# Patient Record
Sex: Male | Born: 1935 | Race: White | Hispanic: No | Marital: Married | Smoking: Former smoker
Health system: Southern US, Community
[De-identification: ages and names within clinical notes are randomized; demographics above are authoritative.]

## PROBLEM LIST (undated history)

## (undated) DIAGNOSIS — E039 Hypothyroidism, unspecified: Secondary | ICD-10-CM

## (undated) DIAGNOSIS — R609 Edema, unspecified: Secondary | ICD-10-CM

## (undated) DIAGNOSIS — G252 Other specified forms of tremor: Secondary | ICD-10-CM

## (undated) DIAGNOSIS — D649 Anemia, unspecified: Secondary | ICD-10-CM

## (undated) DIAGNOSIS — I4891 Unspecified atrial fibrillation: Secondary | ICD-10-CM

## (undated) DIAGNOSIS — S60229A Contusion of unspecified hand, initial encounter: Secondary | ICD-10-CM

## (undated) DIAGNOSIS — F329 Major depressive disorder, single episode, unspecified: Secondary | ICD-10-CM

## (undated) DIAGNOSIS — I2510 Atherosclerotic heart disease of native coronary artery without angina pectoris: Secondary | ICD-10-CM

## (undated) DIAGNOSIS — J301 Allergic rhinitis due to pollen: Secondary | ICD-10-CM

## (undated) DIAGNOSIS — I509 Heart failure, unspecified: Secondary | ICD-10-CM

## (undated) DIAGNOSIS — B372 Candidiasis of skin and nail: Secondary | ICD-10-CM

## (undated) DIAGNOSIS — R269 Unspecified abnormalities of gait and mobility: Secondary | ICD-10-CM

## (undated) DIAGNOSIS — Z9181 History of falling: Secondary | ICD-10-CM

## (undated) DIAGNOSIS — Z8673 Personal history of transient ischemic attack (TIA), and cerebral infarction without residual deficits: Secondary | ICD-10-CM

## (undated) DIAGNOSIS — R079 Chest pain, unspecified: Secondary | ICD-10-CM

## (undated) DIAGNOSIS — M26629 Arthralgia of temporomandibular joint, unspecified side: Secondary | ICD-10-CM

## (undated) DIAGNOSIS — R1310 Dysphagia, unspecified: Secondary | ICD-10-CM

## (undated) DIAGNOSIS — D62 Acute posthemorrhagic anemia: Secondary | ICD-10-CM

## (undated) DIAGNOSIS — J438 Other emphysema: Secondary | ICD-10-CM

## (undated) DIAGNOSIS — I10 Essential (primary) hypertension: Secondary | ICD-10-CM

## (undated) DIAGNOSIS — M2550 Pain in unspecified joint: Secondary | ICD-10-CM

## (undated) DIAGNOSIS — G250 Essential tremor: Secondary | ICD-10-CM

## (undated) DIAGNOSIS — R942 Abnormal results of pulmonary function studies: Secondary | ICD-10-CM

## (undated) DIAGNOSIS — E785 Hyperlipidemia, unspecified: Secondary | ICD-10-CM

## (undated) DIAGNOSIS — F32A Depression, unspecified: Secondary | ICD-10-CM

## (undated) DIAGNOSIS — J449 Chronic obstructive pulmonary disease, unspecified: Secondary | ICD-10-CM

## (undated) DIAGNOSIS — M791 Myalgia, unspecified site: Secondary | ICD-10-CM

## (undated) DIAGNOSIS — J479 Bronchiectasis, uncomplicated: Secondary | ICD-10-CM

## (undated) DIAGNOSIS — C801 Malignant (primary) neoplasm, unspecified: Secondary | ICD-10-CM

## (undated) DIAGNOSIS — E876 Hypokalemia: Secondary | ICD-10-CM

## (undated) DIAGNOSIS — E508 Other manifestations of vitamin A deficiency: Secondary | ICD-10-CM

## (undated) DIAGNOSIS — M4800 Spinal stenosis, site unspecified: Secondary | ICD-10-CM

## (undated) DIAGNOSIS — J189 Pneumonia, unspecified organism: Secondary | ICD-10-CM

## (undated) DIAGNOSIS — R413 Other amnesia: Secondary | ICD-10-CM

## (undated) DIAGNOSIS — E871 Hypo-osmolality and hyponatremia: Secondary | ICD-10-CM

## (undated) DIAGNOSIS — A318 Other mycobacterial infections: Secondary | ICD-10-CM

## (undated) DIAGNOSIS — M6281 Muscle weakness (generalized): Secondary | ICD-10-CM

## (undated) DIAGNOSIS — M1990 Unspecified osteoarthritis, unspecified site: Secondary | ICD-10-CM

## (undated) DIAGNOSIS — Z87898 Personal history of other specified conditions: Secondary | ICD-10-CM

## (undated) DIAGNOSIS — R21 Rash and other nonspecific skin eruption: Secondary | ICD-10-CM

## (undated) DIAGNOSIS — H6120 Impacted cerumen, unspecified ear: Secondary | ICD-10-CM

## (undated) DIAGNOSIS — I959 Hypotension, unspecified: Secondary | ICD-10-CM

## (undated) DIAGNOSIS — M4000 Postural kyphosis, site unspecified: Secondary | ICD-10-CM

## (undated) DIAGNOSIS — K210 Gastro-esophageal reflux disease with esophagitis: Secondary | ICD-10-CM

## (undated) DIAGNOSIS — I259 Chronic ischemic heart disease, unspecified: Secondary | ICD-10-CM

## (undated) HISTORY — DX: Spinal stenosis, site unspecified: M48.00

## (undated) HISTORY — DX: Major depressive disorder, single episode, unspecified: F32.9

## (undated) HISTORY — DX: Dysphagia, unspecified: R13.10

## (undated) HISTORY — DX: Personal history of other specified conditions: Z87.898

## (undated) HISTORY — DX: Depression, unspecified: F32.A

## (undated) HISTORY — DX: Other manifestations of vitamin A deficiency: E50.8

## (undated) HISTORY — DX: Allergic rhinitis due to pollen: J30.1

## (undated) HISTORY — DX: Other specified forms of tremor: G25.2

## (undated) HISTORY — PX: CATARACT EXTRACTION: SUR2

## (undated) HISTORY — DX: Hypo-osmolality and hyponatremia: E87.1

## (undated) HISTORY — DX: Contusion of unspecified hand, initial encounter: S60.229A

## (undated) HISTORY — DX: Hypothyroidism, unspecified: E03.9

## (undated) HISTORY — DX: Hyperlipidemia, unspecified: E78.5

## (undated) HISTORY — DX: Personal history of transient ischemic attack (TIA), and cerebral infarction without residual deficits: Z86.73

## (undated) HISTORY — DX: Gastro-esophageal reflux disease with esophagitis: K21.0

## (undated) HISTORY — DX: Hypokalemia: E87.6

## (undated) HISTORY — DX: Bronchiectasis, uncomplicated: J47.9

## (undated) HISTORY — DX: Other mycobacterial infections: A31.8

## (undated) HISTORY — DX: Hypotension, unspecified: I95.9

## (undated) HISTORY — DX: Myalgia, unspecified site: M79.10

## (undated) HISTORY — DX: Acute posthemorrhagic anemia: D62

## (undated) HISTORY — DX: Edema, unspecified: R60.9

## (undated) HISTORY — PX: CARPAL TUNNEL RELEASE: SHX101

## (undated) HISTORY — DX: Muscle weakness (generalized): M62.81

## (undated) HISTORY — DX: Chest pain, unspecified: R07.9

## (undated) HISTORY — DX: Chronic ischemic heart disease, unspecified: I25.9

## (undated) HISTORY — DX: Pain in unspecified joint: M25.50

## (undated) HISTORY — DX: Abnormal results of pulmonary function studies: R94.2

## (undated) HISTORY — DX: Other amnesia: R41.3

## (undated) HISTORY — DX: Atherosclerotic heart disease of native coronary artery without angina pectoris: I25.10

## (undated) HISTORY — DX: History of falling: Z91.81

## (undated) HISTORY — DX: Candidiasis of skin and nail: B37.2

## (undated) HISTORY — DX: Impacted cerumen, unspecified ear: H61.20

## (undated) HISTORY — DX: Unspecified atrial fibrillation: I48.91

## (undated) HISTORY — DX: Rash and other nonspecific skin eruption: R21

## (undated) HISTORY — DX: Malignant (primary) neoplasm, unspecified: C80.1

## (undated) HISTORY — DX: Essential (primary) hypertension: I10

## (undated) HISTORY — PX: LAMINECTOMY: SHX219

## (undated) HISTORY — DX: Unspecified abnormalities of gait and mobility: R26.9

## (undated) HISTORY — DX: Chronic obstructive pulmonary disease, unspecified: J44.9

## (undated) HISTORY — DX: Postural kyphosis, site unspecified: M40.00

## (undated) HISTORY — DX: Essential tremor: G25.0

## (undated) HISTORY — DX: Anemia, unspecified: D64.9

## (undated) HISTORY — DX: Arthralgia of temporomandibular joint, unspecified side: M26.629

## (undated) HISTORY — PX: ROTATOR CUFF REPAIR: SHX139

## (undated) HISTORY — DX: Other emphysema: J43.8

---

## 1989-11-18 HISTORY — PX: CORONARY ANGIOPLASTY: SHX604

## 1998-05-19 ENCOUNTER — Other Ambulatory Visit: Admission: RE | Admit: 1998-05-19 | Discharge: 1998-05-19 | Payer: Self-pay | Admitting: Cardiology

## 1998-09-19 ENCOUNTER — Ambulatory Visit (HOSPITAL_COMMUNITY): Admission: RE | Admit: 1998-09-19 | Discharge: 1998-09-19 | Payer: Self-pay | Admitting: Internal Medicine

## 1998-09-19 ENCOUNTER — Encounter: Payer: Self-pay | Admitting: Internal Medicine

## 2000-03-21 ENCOUNTER — Other Ambulatory Visit: Admission: RE | Admit: 2000-03-21 | Discharge: 2000-03-21 | Payer: Self-pay | Admitting: Gastroenterology

## 2000-06-13 ENCOUNTER — Ambulatory Visit (HOSPITAL_COMMUNITY): Admission: RE | Admit: 2000-06-13 | Discharge: 2000-06-14 | Payer: Self-pay | Admitting: Cardiology

## 2000-06-13 HISTORY — PX: CARDIAC CATHETERIZATION: SHX172

## 2001-06-12 ENCOUNTER — Encounter: Payer: Self-pay | Admitting: Emergency Medicine

## 2001-06-12 ENCOUNTER — Emergency Department (HOSPITAL_COMMUNITY): Admission: EM | Admit: 2001-06-12 | Discharge: 2001-06-12 | Payer: Self-pay | Admitting: Emergency Medicine

## 2001-12-02 ENCOUNTER — Encounter: Payer: Self-pay | Admitting: Orthopedic Surgery

## 2001-12-02 ENCOUNTER — Encounter: Admission: RE | Admit: 2001-12-02 | Discharge: 2001-12-02 | Payer: Self-pay | Admitting: Orthopedic Surgery

## 2002-02-04 ENCOUNTER — Inpatient Hospital Stay (HOSPITAL_COMMUNITY): Admission: RE | Admit: 2002-02-04 | Discharge: 2002-02-06 | Payer: Self-pay | Admitting: Orthopedic Surgery

## 2004-03-19 ENCOUNTER — Encounter: Admission: RE | Admit: 2004-03-19 | Discharge: 2004-03-19 | Payer: Self-pay | Admitting: Orthopedic Surgery

## 2004-04-23 ENCOUNTER — Inpatient Hospital Stay (HOSPITAL_COMMUNITY): Admission: RE | Admit: 2004-04-23 | Discharge: 2004-04-25 | Payer: Self-pay | Admitting: Orthopedic Surgery

## 2004-05-29 ENCOUNTER — Ambulatory Visit (HOSPITAL_COMMUNITY): Admission: RE | Admit: 2004-05-29 | Discharge: 2004-05-29 | Payer: Self-pay | Admitting: Orthopedic Surgery

## 2004-11-21 ENCOUNTER — Ambulatory Visit: Payer: Self-pay | Admitting: Internal Medicine

## 2005-02-14 ENCOUNTER — Ambulatory Visit: Payer: Self-pay | Admitting: Internal Medicine

## 2005-03-14 ENCOUNTER — Ambulatory Visit: Payer: Self-pay | Admitting: Internal Medicine

## 2005-04-09 ENCOUNTER — Ambulatory Visit (HOSPITAL_COMMUNITY): Admission: RE | Admit: 2005-04-09 | Discharge: 2005-04-09 | Payer: Self-pay | Admitting: Ophthalmology

## 2005-04-11 ENCOUNTER — Ambulatory Visit: Payer: Self-pay | Admitting: Internal Medicine

## 2005-06-03 ENCOUNTER — Ambulatory Visit: Payer: Self-pay | Admitting: Internal Medicine

## 2005-06-18 HISTORY — PX: KNEE SURGERY: SHX244

## 2005-07-16 ENCOUNTER — Inpatient Hospital Stay (HOSPITAL_COMMUNITY): Admission: RE | Admit: 2005-07-16 | Discharge: 2005-07-23 | Payer: Self-pay | Admitting: Orthopedic Surgery

## 2005-07-16 ENCOUNTER — Ambulatory Visit: Payer: Self-pay | Admitting: Physical Medicine & Rehabilitation

## 2005-08-20 ENCOUNTER — Encounter: Admission: RE | Admit: 2005-08-20 | Discharge: 2005-08-20 | Payer: Self-pay | Admitting: Surgery

## 2005-08-23 ENCOUNTER — Ambulatory Visit (HOSPITAL_COMMUNITY): Admission: RE | Admit: 2005-08-23 | Discharge: 2005-08-23 | Payer: Self-pay | Admitting: Surgery

## 2005-08-23 ENCOUNTER — Encounter (INDEPENDENT_AMBULATORY_CARE_PROVIDER_SITE_OTHER): Payer: Self-pay | Admitting: *Deleted

## 2005-08-23 ENCOUNTER — Ambulatory Visit (HOSPITAL_BASED_OUTPATIENT_CLINIC_OR_DEPARTMENT_OTHER): Admission: RE | Admit: 2005-08-23 | Discharge: 2005-08-23 | Payer: Self-pay | Admitting: Surgery

## 2005-08-29 ENCOUNTER — Ambulatory Visit: Payer: Self-pay | Admitting: Hematology and Oncology

## 2005-09-06 ENCOUNTER — Ambulatory Visit: Payer: Self-pay | Admitting: Internal Medicine

## 2005-09-11 ENCOUNTER — Ambulatory Visit (HOSPITAL_COMMUNITY): Admission: RE | Admit: 2005-09-11 | Discharge: 2005-09-11 | Payer: Self-pay | Admitting: Hematology and Oncology

## 2005-09-18 ENCOUNTER — Encounter (INDEPENDENT_AMBULATORY_CARE_PROVIDER_SITE_OTHER): Payer: Self-pay | Admitting: Specialist

## 2005-09-18 ENCOUNTER — Ambulatory Visit (HOSPITAL_COMMUNITY): Admission: RE | Admit: 2005-09-18 | Discharge: 2005-09-18 | Payer: Self-pay | Admitting: Hematology and Oncology

## 2005-10-09 ENCOUNTER — Ambulatory Visit: Payer: Self-pay | Admitting: Internal Medicine

## 2005-10-18 ENCOUNTER — Ambulatory Visit: Payer: Self-pay | Admitting: Pulmonary Disease

## 2005-10-24 ENCOUNTER — Ambulatory Visit: Payer: Self-pay | Admitting: Hematology and Oncology

## 2005-10-30 ENCOUNTER — Ambulatory Visit (HOSPITAL_COMMUNITY): Admission: RE | Admit: 2005-10-30 | Discharge: 2005-10-30 | Payer: Self-pay | Admitting: Hematology and Oncology

## 2005-11-21 ENCOUNTER — Ambulatory Visit: Payer: Self-pay | Admitting: Internal Medicine

## 2006-03-04 ENCOUNTER — Ambulatory Visit: Payer: Self-pay | Admitting: Internal Medicine

## 2006-04-15 ENCOUNTER — Ambulatory Visit: Payer: Self-pay | Admitting: Internal Medicine

## 2006-04-30 ENCOUNTER — Ambulatory Visit: Payer: Self-pay | Admitting: Hematology and Oncology

## 2006-05-02 LAB — CBC WITH DIFFERENTIAL/PLATELET
Basophils Absolute: 0 10*3/uL (ref 0.0–0.1)
EOS%: 2.2 % (ref 0.0–7.0)
Eosinophils Absolute: 0.1 10*3/uL (ref 0.0–0.5)
HCT: 38 % — ABNORMAL LOW (ref 38.7–49.9)
HGB: 12.8 g/dL — ABNORMAL LOW (ref 13.0–17.1)
MCH: 33.8 pg — ABNORMAL HIGH (ref 28.0–33.4)
MCV: 100.6 fL — ABNORMAL HIGH (ref 81.6–98.0)
NEUT#: 3 10*3/uL (ref 1.5–6.5)
NEUT%: 60.9 % (ref 40.0–75.0)
RDW: 14.3 % (ref 11.2–14.6)
lymph#: 1.1 10*3/uL (ref 0.9–3.3)

## 2006-05-02 LAB — LACTATE DEHYDROGENASE: LDH: 196 U/L (ref 94–250)

## 2006-05-02 LAB — COMPREHENSIVE METABOLIC PANEL
Albumin: 4.1 g/dL (ref 3.5–5.2)
BUN: 11 mg/dL (ref 6–23)
Calcium: 9.4 mg/dL (ref 8.4–10.5)
Chloride: 102 mEq/L (ref 96–112)
Creatinine, Ser: 1.04 mg/dL (ref 0.40–1.50)
Glucose, Bld: 69 mg/dL — ABNORMAL LOW (ref 70–99)
Potassium: 4.3 mEq/L (ref 3.5–5.3)

## 2006-05-06 ENCOUNTER — Ambulatory Visit (HOSPITAL_COMMUNITY): Admission: RE | Admit: 2006-05-06 | Discharge: 2006-05-06 | Payer: Self-pay | Admitting: Hematology and Oncology

## 2006-05-14 ENCOUNTER — Ambulatory Visit (HOSPITAL_COMMUNITY): Admission: RE | Admit: 2006-05-14 | Discharge: 2006-05-14 | Payer: Self-pay | Admitting: Hematology and Oncology

## 2006-07-08 ENCOUNTER — Ambulatory Visit: Payer: Self-pay | Admitting: Internal Medicine

## 2006-09-19 ENCOUNTER — Ambulatory Visit: Payer: Self-pay | Admitting: Internal Medicine

## 2006-10-31 ENCOUNTER — Ambulatory Visit: Payer: Self-pay | Admitting: Internal Medicine

## 2006-12-30 ENCOUNTER — Ambulatory Visit: Payer: Self-pay | Admitting: Hematology and Oncology

## 2006-12-30 ENCOUNTER — Emergency Department (HOSPITAL_COMMUNITY): Admission: EM | Admit: 2006-12-30 | Discharge: 2006-12-30 | Payer: Self-pay | Admitting: Emergency Medicine

## 2007-01-02 LAB — CBC WITH DIFFERENTIAL/PLATELET
BASO%: 1.6 % (ref 0.0–2.0)
MCHC: 35.7 g/dL (ref 32.0–35.9)
MONO#: 0.7 10*3/uL (ref 0.1–0.9)
RBC: 3.52 10*6/uL — ABNORMAL LOW (ref 4.20–5.71)
WBC: 6.6 10*3/uL (ref 4.0–10.0)
lymph#: 1.4 10*3/uL (ref 0.9–3.3)

## 2007-01-02 LAB — COMPREHENSIVE METABOLIC PANEL
ALT: 17 U/L (ref 0–53)
CO2: 27 mEq/L (ref 19–32)
Calcium: 8.9 mg/dL (ref 8.4–10.5)
Chloride: 104 mEq/L (ref 96–112)
Potassium: 4.5 mEq/L (ref 3.5–5.3)
Sodium: 140 mEq/L (ref 135–145)
Total Protein: 7 g/dL (ref 6.0–8.3)

## 2007-01-02 LAB — LACTATE DEHYDROGENASE: LDH: 196 U/L (ref 94–250)

## 2007-01-06 ENCOUNTER — Ambulatory Visit (HOSPITAL_COMMUNITY): Admission: RE | Admit: 2007-01-06 | Discharge: 2007-01-06 | Payer: Self-pay | Admitting: Hematology and Oncology

## 2007-03-06 ENCOUNTER — Ambulatory Visit: Payer: Self-pay | Admitting: Internal Medicine

## 2007-09-02 ENCOUNTER — Ambulatory Visit: Payer: Self-pay | Admitting: Internal Medicine

## 2007-09-28 ENCOUNTER — Ambulatory Visit: Payer: Self-pay | Admitting: Hematology and Oncology

## 2007-09-30 LAB — CBC WITH DIFFERENTIAL/PLATELET
BASO%: 0.5 % (ref 0.0–2.0)
EOS%: 1 % (ref 0.0–7.0)
HCT: 35.6 % — ABNORMAL LOW (ref 38.7–49.9)
MCH: 35.8 pg — ABNORMAL HIGH (ref 28.0–33.4)
MCHC: 35.4 g/dL (ref 32.0–35.9)
NEUT%: 66.5 % (ref 40.0–75.0)
lymph#: 1.5 10*3/uL (ref 0.9–3.3)

## 2007-09-30 LAB — COMPREHENSIVE METABOLIC PANEL
ALT: 17 U/L (ref 0–53)
AST: 22 U/L (ref 0–37)
Alkaline Phosphatase: 53 U/L (ref 39–117)
Calcium: 9 mg/dL (ref 8.4–10.5)
Chloride: 102 mEq/L (ref 96–112)
Creatinine, Ser: 0.97 mg/dL (ref 0.40–1.50)
Total Bilirubin: 0.5 mg/dL (ref 0.3–1.2)

## 2007-10-05 ENCOUNTER — Ambulatory Visit (HOSPITAL_COMMUNITY): Admission: RE | Admit: 2007-10-05 | Discharge: 2007-10-05 | Payer: Self-pay | Admitting: Hematology and Oncology

## 2007-10-13 ENCOUNTER — Inpatient Hospital Stay (HOSPITAL_COMMUNITY): Admission: RE | Admit: 2007-10-13 | Discharge: 2007-10-15 | Payer: Self-pay | Admitting: Orthopedic Surgery

## 2007-10-24 DIAGNOSIS — J449 Chronic obstructive pulmonary disease, unspecified: Secondary | ICD-10-CM

## 2007-10-24 DIAGNOSIS — A318 Other mycobacterial infections: Secondary | ICD-10-CM | POA: Insufficient documentation

## 2007-10-24 DIAGNOSIS — J4489 Other specified chronic obstructive pulmonary disease: Secondary | ICD-10-CM | POA: Insufficient documentation

## 2008-03-21 ENCOUNTER — Ambulatory Visit: Payer: Self-pay | Admitting: Internal Medicine

## 2008-03-21 DIAGNOSIS — J309 Allergic rhinitis, unspecified: Secondary | ICD-10-CM | POA: Insufficient documentation

## 2008-03-21 DIAGNOSIS — R29818 Other symptoms and signs involving the nervous system: Secondary | ICD-10-CM | POA: Insufficient documentation

## 2008-03-25 ENCOUNTER — Telehealth (INDEPENDENT_AMBULATORY_CARE_PROVIDER_SITE_OTHER): Payer: Self-pay | Admitting: *Deleted

## 2008-03-28 ENCOUNTER — Telehealth (INDEPENDENT_AMBULATORY_CARE_PROVIDER_SITE_OTHER): Payer: Self-pay | Admitting: *Deleted

## 2008-06-27 ENCOUNTER — Encounter: Payer: Self-pay | Admitting: Internal Medicine

## 2008-09-19 IMAGING — CR DG LUMBAR SPINE 2-3V
2 series · 2 of 2 positions shown · non-contrast
Comparison: none

CLINICAL DATA: 83-year-old with spinal stenosis.  
 LUMBAR SPINE - 2 VIEW:

[view not recorded (1 of 2)]
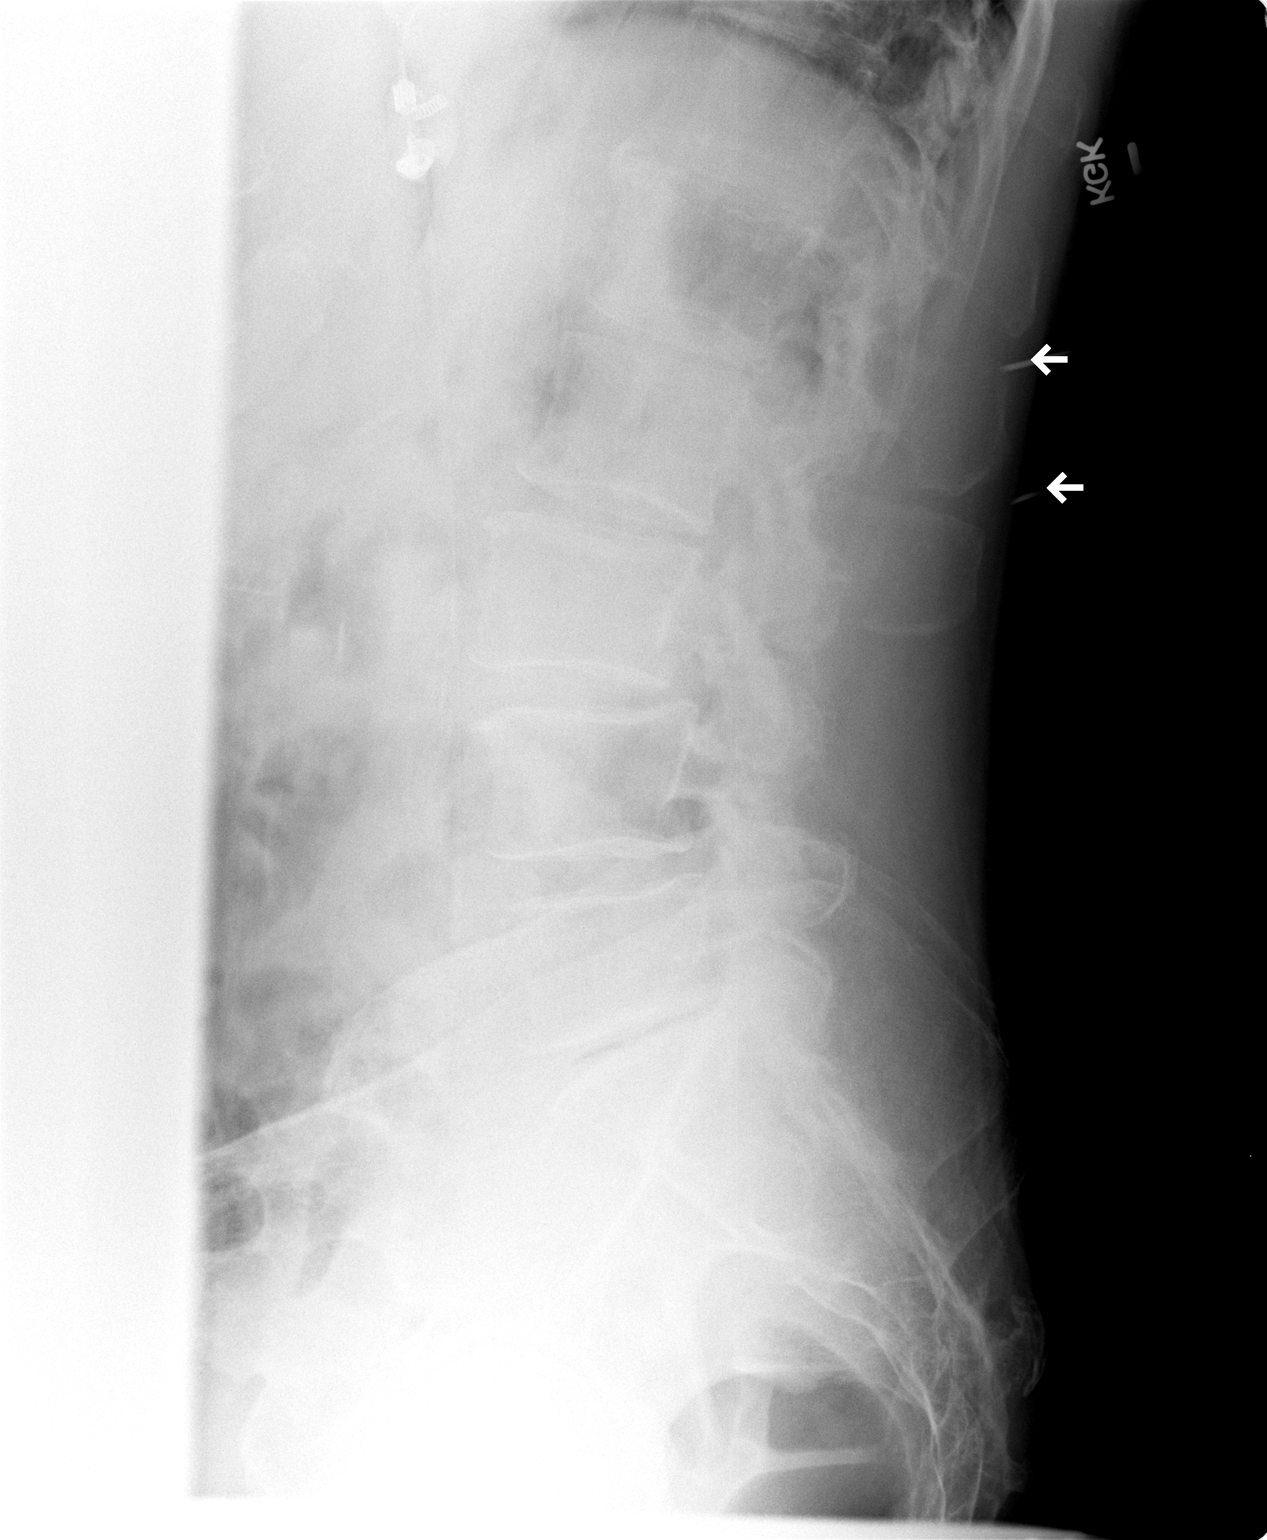

[view not recorded (2 of 2)]
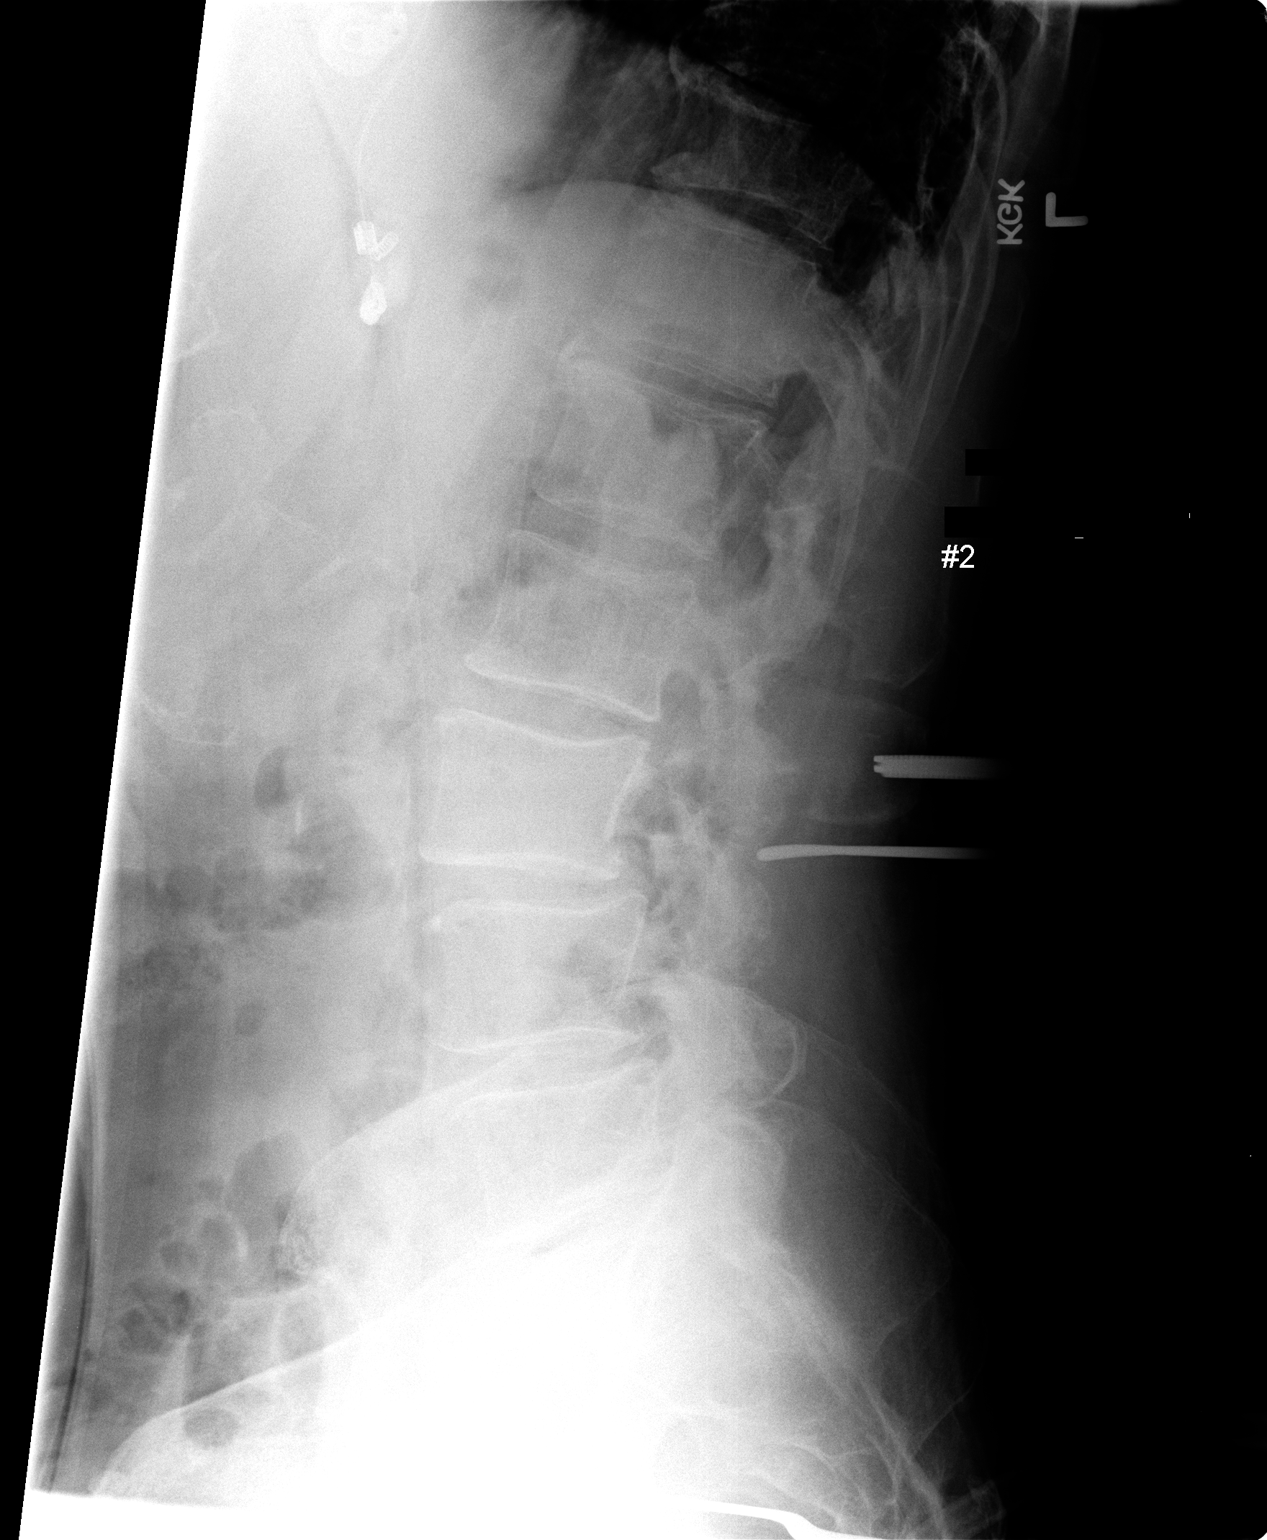

[2 of 2 positions shown; findings below may reference images not displayed]

FINDINGS: Two lateral cervical spine films from the operating room.  
 The first film demonstrates two spinal needles which are projecting at the L1 and L2 spinous processes.  The second lateral film demonstrates a surgical clamp on the L2 spinous process and a surgical instrument marking the L3 vertebral body level.
IMPRESSION: L1 and L2 marked initially and L2 and L3 marked on the last film.

## 2008-09-27 ENCOUNTER — Ambulatory Visit: Payer: Self-pay | Admitting: Hematology and Oncology

## 2008-09-29 ENCOUNTER — Ambulatory Visit (HOSPITAL_COMMUNITY): Admission: RE | Admit: 2008-09-29 | Discharge: 2008-09-29 | Payer: Self-pay | Admitting: Hematology and Oncology

## 2008-10-06 LAB — CBC WITH DIFFERENTIAL/PLATELET
BASO%: 0.4 % (ref 0.0–2.0)
LYMPH%: 26.3 % (ref 14.0–48.0)
MCHC: 34.4 g/dL (ref 32.0–35.9)
MCV: 101.5 fL — ABNORMAL HIGH (ref 81.6–98.0)
MONO#: 0.7 10*3/uL (ref 0.1–0.9)
MONO%: 11 % (ref 0.0–13.0)
NEUT#: 3.9 10*3/uL (ref 1.5–6.5)
Platelets: 180 10*3/uL (ref 145–400)
RBC: 3.37 10*6/uL — ABNORMAL LOW (ref 4.20–5.71)
RDW: 14.6 % (ref 11.2–14.6)
WBC: 6.4 10*3/uL (ref 4.0–10.0)

## 2008-10-06 LAB — COMPREHENSIVE METABOLIC PANEL
ALT: 16 U/L (ref 0–53)
AST: 23 U/L (ref 0–37)
Albumin: 4 g/dL (ref 3.5–5.2)
Alkaline Phosphatase: 51 U/L (ref 39–117)
Potassium: 4.1 mEq/L (ref 3.5–5.3)
Sodium: 137 mEq/L (ref 135–145)
Total Bilirubin: 0.5 mg/dL (ref 0.3–1.2)
Total Protein: 7.3 g/dL (ref 6.0–8.3)

## 2008-10-06 LAB — IRON AND TIBC
%SAT: 26 % (ref 20–55)
Iron: 81 ug/dL (ref 42–165)
TIBC: 313 ug/dL (ref 215–435)
UIBC: 232 ug/dL

## 2008-10-06 LAB — VITAMIN B12: Vitamin B-12: 527 pg/mL (ref 211–911)

## 2008-10-06 LAB — FOLATE: Folate: >20 ng/mL

## 2008-10-06 LAB — FERRITIN: Ferritin: 40 ng/mL (ref 22–322)

## 2008-11-22 ENCOUNTER — Ambulatory Visit: Payer: Self-pay | Admitting: Internal Medicine

## 2008-11-22 DIAGNOSIS — R05 Cough: Secondary | ICD-10-CM

## 2009-01-04 ENCOUNTER — Ambulatory Visit: Payer: Self-pay | Admitting: Vascular Surgery

## 2009-08-07 ENCOUNTER — Telehealth: Payer: Self-pay | Admitting: Internal Medicine

## 2009-09-08 ENCOUNTER — Ambulatory Visit: Payer: Self-pay | Admitting: Internal Medicine

## 2009-09-08 DIAGNOSIS — J471 Bronchiectasis with (acute) exacerbation: Secondary | ICD-10-CM

## 2009-09-28 ENCOUNTER — Ambulatory Visit: Payer: Self-pay | Admitting: Oncology

## 2009-10-02 ENCOUNTER — Ambulatory Visit (HOSPITAL_COMMUNITY): Admission: RE | Admit: 2009-10-02 | Discharge: 2009-10-02 | Payer: Self-pay | Admitting: Hematology and Oncology

## 2009-10-02 LAB — CBC WITH DIFFERENTIAL/PLATELET
EOS%: 1.4 % (ref 0.0–7.0)
Eosinophils Absolute: 0.1 10*3/uL (ref 0.0–0.5)
LYMPH%: 23.7 % (ref 14.0–49.0)
MCH: 34.8 pg — ABNORMAL HIGH (ref 27.2–33.4)
MCV: 102.4 fL — ABNORMAL HIGH (ref 79.3–98.0)
MONO%: 11.3 % (ref 0.0–14.0)
NEUT#: 3.9 10*3/uL (ref 1.5–6.5)
Platelets: 168 10*3/uL (ref 140–400)
RBC: 3.47 10*6/uL — ABNORMAL LOW (ref 4.20–5.82)
RDW: 14.6 % (ref 11.0–14.6)

## 2009-10-02 LAB — COMPREHENSIVE METABOLIC PANEL
AST: 23 U/L (ref 0–37)
Alkaline Phosphatase: 57 U/L (ref 39–117)
BUN: 12 mg/dL (ref 6–23)
Glucose, Bld: 92 mg/dL (ref 70–99)
Potassium: 4.2 mEq/L (ref 3.5–5.3)
Sodium: 137 mEq/L (ref 135–145)
Total Bilirubin: 0.6 mg/dL (ref 0.3–1.2)
Total Protein: 7.3 g/dL (ref 6.0–8.3)

## 2009-10-05 ENCOUNTER — Encounter: Payer: Self-pay | Admitting: Internal Medicine

## 2009-10-31 ENCOUNTER — Ambulatory Visit: Payer: Self-pay | Admitting: Vascular Surgery

## 2010-01-17 ENCOUNTER — Ambulatory Visit: Payer: Self-pay | Admitting: Internal Medicine

## 2010-01-18 ENCOUNTER — Telehealth (INDEPENDENT_AMBULATORY_CARE_PROVIDER_SITE_OTHER): Payer: Self-pay | Admitting: *Deleted

## 2010-03-21 ENCOUNTER — Ambulatory Visit: Payer: Self-pay | Admitting: Internal Medicine

## 2010-03-21 DIAGNOSIS — K219 Gastro-esophageal reflux disease without esophagitis: Secondary | ICD-10-CM

## 2010-03-21 DIAGNOSIS — J479 Bronchiectasis, uncomplicated: Secondary | ICD-10-CM

## 2010-04-18 HISTORY — PX: CARDIOVASCULAR STRESS TEST: SHX262

## 2010-05-02 ENCOUNTER — Inpatient Hospital Stay (HOSPITAL_COMMUNITY): Admission: EM | Admit: 2010-05-02 | Discharge: 2010-05-03 | Payer: Self-pay | Admitting: Cardiology

## 2010-05-02 ENCOUNTER — Encounter: Payer: Self-pay | Admitting: Emergency Medicine

## 2010-08-21 ENCOUNTER — Ambulatory Visit: Payer: Self-pay | Admitting: Cardiology

## 2010-09-25 ENCOUNTER — Ambulatory Visit: Payer: Self-pay | Admitting: Internal Medicine

## 2010-09-27 ENCOUNTER — Ambulatory Visit: Payer: Self-pay | Admitting: Hematology and Oncology

## 2010-10-01 LAB — COMPREHENSIVE METABOLIC PANEL
AST: 36 U/L (ref 0–37)
Albumin: 4 g/dL (ref 3.5–5.2)
BUN: 14 mg/dL (ref 6–23)
CO2: 26 mEq/L (ref 19–32)
Calcium: 8.7 mg/dL (ref 8.4–10.5)
Chloride: 103 mEq/L (ref 96–112)
Glucose, Bld: 91 mg/dL (ref 70–99)
Potassium: 3.9 mEq/L (ref 3.5–5.3)
Sodium: 137 mEq/L (ref 135–145)
Total Bilirubin: 0.4 mg/dL (ref 0.3–1.2)

## 2010-10-01 LAB — CBC WITH DIFFERENTIAL/PLATELET
BASO%: 0.3 % (ref 0.0–2.0)
Basophils Absolute: 0 10*3/uL (ref 0.0–0.1)
EOS%: 1.1 % (ref 0.0–7.0)
Eosinophils Absolute: 0.1 10*3/uL (ref 0.0–0.5)
HGB: 11 g/dL — ABNORMAL LOW (ref 13.0–17.1)
MONO%: 11.5 % (ref 0.0–14.0)
NEUT%: 67.1 % (ref 39.0–75.0)
RBC: 3.18 10*6/uL — ABNORMAL LOW (ref 4.20–5.82)
RDW: 15 % — ABNORMAL HIGH (ref 11.0–14.6)
WBC: 6.2 10*3/uL (ref 4.0–10.3)

## 2010-10-01 LAB — LACTATE DEHYDROGENASE: LDH: 240 U/L (ref 94–250)

## 2010-10-04 ENCOUNTER — Encounter: Payer: Self-pay | Admitting: Internal Medicine

## 2010-12-08 ENCOUNTER — Encounter: Payer: Self-pay | Admitting: Oncology

## 2010-12-20 NOTE — Assessment & Plan Note (Signed)
Summary: Pulmonary/ acute ext ov with hfa teaching   Primary Provider/Referring Provider:  [name]  CC:  Acute visit.  Pt c/o dry cough x 2 wks.  He states that his chest feels sore from coughing.  Marland Kitchen  History of Present Illness: 77 yowm quit smoking in 1953 with minimal airflow obstruction by PFT's 12/07 and evidence of MAI infection by previous FOB (see PMHX)  November 22, 2008 ov  acutely ill x 1 week with severe cough, mucoid sputum, and generalized ant cp from coughing assoc with mild sob while coughing only.  Pt denies any significant sore throat, nasal congestion or excess secretions, fever, chills, sweats, unintended wt loss, pleuritic or exertional cp, orthopnea pnd or leg swelling.    September 08, 2009 Acute visit.  Pt c/o prod cough with yellow sputum x 1 wk.  Pt states that his breathing has been okay but he does state that lying flat on his back can make him SOB.  better on mucinex. Wife reports more hoarseness x 1 year ? advair related.  symbicort 160 two times a day   January 17, 2010 Acute visit.  Pt c/o dry cough x 2 wks.  He states that his chest feels sore from coughing.  Mucindex dm not helping. mild dysphagia.  no sob > baseline.  Pt denies any significant sore throat, dysphagia, itching, sneezing,  nasal congestion or excess secretions,  fever, chills, sweats, unintended wt loss, lateralizing pleuritic or exertional cp, hempoptysis, change in activity tolerance  orthopnea pnd or leg swelling.  Pt also denies any obvious fluctuation in symptoms with weather or environmental change or other alleviating or aggravating factors.       Current Medications (verified): 1)  Synthroid 50 Mcg  Tabs (Levothyroxine Sodium) .... Take 1 Tablet By Mouth Once A Day 2)  Crestor 5 Mg  Tabs (Rosuvastatin Calcium) .... Take One Tablet At Bedtime 3)  Cartia Xt 300 Mg  Cp24 (Diltiazem Hcl Coated Beads) .... Take 1 Tablet By Mouth Once A Day 4)  Flonase 50 Mcg/act  Susp (Fluticasone Propionate) ....  Use One Spray in Each Nostril Every Day 5)  Preservision Areds   Caps (Multiple Vitamins-Minerals) .... Use One Capsule Two Times A Day 6)  Lutein 20 Mg  Caps (Lutein) .... Take One Capsule Once Daily 7)  Sertraline Hcl 50 Mg  Tabs (Sertraline Hcl) .... Take 1 Tablet By Mouth Once A Day 8)  Hyoscyamine Sulfate 0.125 Mg  Tabs (Hyoscyamine Sulfate) .... Take One Tablet Daily As Needed 9)  Tylenol Cold Relief 12.5-500 Mg  Tabs (Diphenhydramine-Apap) .... As Needed 10)  Nitroglycerin Cr 2.5 Mg  Cpcr (Nitroglycerin) .... As Needed 11)  Mucinex 600 Mg  Tb12 (Guaifenesin) .... Take 2 Tablets Two Times A Day As Needed 12)  Vitamin D3 1000 Unit Caps (Cholecalciferol) .Marland Kitchen.. 1 Once Daily 13)  Symbicort 160-4.5 Mcg/act  Aero (Budesonide-Formoterol Fumarate) .... 2 Puffs First Thing  in Am and 2 Puffs Again in Pm About 12 Hours Later 14)  Levaquin 750 Mg  Tabs (Levofloxacin) .... One Tablet By Mouth Daily 15)  Warfarin Sodium 2.5 Mg Tabs (Warfarin Sodium) .Marland Kitchen.. 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)     - FOB 3/98     - Rx 3/06 > 11/06 COPD (ICD-496)  - PFT's with minimal changes 12/07  - HFA 50% January 17, 2010  Diverticulosis.....................................................................................[facility]...........Marland KitchenJacky [name]    - Pneumovax 2/04 second shot  Vital Signs:  Patient profile:   77 year old male Weight:      189 pounds O2 Sat:      96 % on Room air Temp:     97.4 degrees F oral Pulse rate:   63 / minute BP sitting:   112 / 64  (left arm)  Vitals Entered By: Vernie Murders (January 17, 2010 11:01 AM)  O2 Flow:  Room air  Physical Exam  Additional Exam:  wt 182 > 186 November 22, 2008 > 188 September 09, 2009  > 189 January 17, 2010  amb chronically ill wm  nad HEENT mild turbinate edema.  Oropharynx no thrush or excess pnd or cobblestoning.  No JVD or cervical adenopathy. Mild accessory muscle hypertrophy. Trachea midline, nl thryroid. Chest was hyperinflated by percussion with diminished breath sounds and moderate increased exp time without wheeze. Hoover sign positive at mid inspiration. Regular rate and rhythm without murmur gallop or rub or increase P2 no edema.  Abd: no hsm, nl excursion. Ext warm without calf tenderness or clubbing.     CXR  Procedure date:  01/17/2010  Findings:        Comparison: 10/02/2009   Findings: Ill-defined parenchymal densities right mid and upper lung zones appear stable.  This may be due to scarring. Chronic parenchymal density in the anterior segment right upper lobe appears stable. Mild chronic parenchymal density in the left upper lobe as well.  Normal cardiomediastinal silhouette.  Spondylosis of the mid to upper dorsal spine.   IMPRESSION: Chronic inflammatory changes appear stable.  Impression & Recommendations:  Problem # 1:  BRONCHIECTASIS W/ACUTE EXACERBATION (ICD-494.1) Atypical airway symptoms with assoc cp ? mscp or reflux related with no apparent evolution of chronic changes on cxr    DDX of  difficult airways managment all start with A and  include Adherence, Ace Inhibitors, Acid Reflux, Active Sinus Disease, Alpha 1 Antitripsin deficiency, Anxiety masquerading as Airways dz,  ABPA,  allergy(esp in young), Aspiration (esp in elderly), Adverse effects of DPI,  Active smokers, plus one B  = Beta blocker use..    Acid reflux may be a unifying dx here, try rx with ppi/h2 at hs and diet.  I spent extra time with the patient today explaining optimal mdi  technique.  This improved from  25>50%  Medications Added to Medication List This Visit: 1)  Warfarin Sodium 2.5 Mg Tabs (Warfarin sodium) .Marland Kitchen.. 1 once daily  Other Orders: T-2 View CXR (71020TC) Est. Patient Level IV (16109)  Patient  Instructions: 1)  when ever you notice an increase in your cough Acid reflux is the leading suspect here and needs to be eliminated  completely before considering additional studies or treatment options. To suppress this maximally, take prilosec 30 min before first meal and pepcid 20 mg (otc) at bedtime plus diet measures as listed until ocugh gone 2)  GERD (REFLUX)  is a common cause of respiratory symptoms. It commonly presents without heartburn and can be treated with medication, but also with lifestyle changes including avoidance of late meals, excessive alcohol, smoking cessation, and avoid fatty foods, chocolate, peppermint, colas, red wine, and acidic juices such as orange juice. NO MINT OR MENTHOL PRODUCTS SO NO COUGH DROPS  3)  USE SUGARLESS CANDY INSTEAD (jolley ranchers)  4)  NO OIL BASED VITAMINS 5)  Work on inhaler technique:  relax and blow all the way out  then take a nice smooth deep breath back in, triggering the inhaler at same time you start breathing in and hold a few seconds 6)  Prednisone 10  4 each am x 2days, 2x2days, 1x2days and stop if cough not improving

## 2010-12-20 NOTE — Assessment & Plan Note (Signed)
Summary: Pulmonary/ f/u bronchiectasis   Primary Provider/Referring Provider:  Jacky Kindle  CC:  3 month followup.  Pt states that he has occ cough in the am- prod with clear to yellow sputum.  Overall cough is much improved.  He c/o waking up in the night with burning in his throat and bad taste.  He relates this to coumadin.  Marland Kitchen  History of Present Illness: 65 yowm quit smoking in 1953 with minimal airflow obstruction by PFT's 12/07 and evidence of MAI infection by previous FOB (see PMHX)  November 22, 2008 ov  acutely ill x 1 week with severe cough, mucoid sputum, and generalized ant cp from coughing assoc with mild sob while coughing only.  Pt denies any significant sore throat, nasal congestion or excess secretions, fever, chills, sweats, unintended wt loss, pleuritic or exertional cp, orthopnea pnd or leg swelling.    September 08, 2009 Acute visit.  Pt c/o prod cough with yellow sputum x 1 wk.  Pt states that his breathing has been okay but he does state that lying flat on his back can make him SOB.  better on mucinex. Wife reports more hoarseness x 1 year ? advair related.  symbicort 160 two times a day   January 17, 2010 Acute visit.  Pt c/o dry cough x 2 wks.  He states that his chest feels sore from coughing.  Mucindex dm not helping. mild dysphagia.  no sob > baseline.   Mar 21, 2010 3 month followup.  Pt states that he has occ cough in the am- prod with clear to yellow sputum.  Overall cough is much improved.  He c/o waking up in the night with burning in his throat and bad taste.  He relates this to coumadin.   no hemoptysis. Pt denies any significant sore throat, dysphagia, itching, sneezing,  nasal congestion or excess secretions,  fever, chills, sweats, unintended wt loss, pleuritic or exertional cp, hempoptysis, change in activity tolerance  orthopnea pnd or leg swelling   Current Medications (verified): 1)  Synthroid 50 Mcg  Tabs (Levothyroxine Sodium) .... Take 1 Tablet By Mouth Once A  Day 2)  Crestor 5 Mg  Tabs (Rosuvastatin Calcium) .... Take One Tablet At Bedtime 3)  Cartia Xt 300 Mg  Cp24 (Diltiazem Hcl Coated Beads) .... Take 1 Tablet By Mouth Once A Day 4)  Symbicort 160-4.5 Mcg/act  Aero (Budesonide-Formoterol Fumarate) .... 2 Puffs First Thing  in Am and 2 Puffs Again in Pm About 12 Hours Later 5)  Flonase 50 Mcg/act  Susp (Fluticasone Propionate) .... Use One Spray in Each Nostril Every Day 6)  Preservision Areds   Caps (Multiple Vitamins-Minerals) .... Use One Capsule Two Times A Day 7)  Sertraline Hcl 50 Mg  Tabs (Sertraline Hcl) .... Take 1 Tablet By Mouth Once A Day 8)  Hyoscyamine Sulfate 0.125 Mg  Tabs (Hyoscyamine Sulfate) .... Take One Tablet Daily As Needed 9)  Tylenol Cold Relief 12.5-500 Mg  Tabs (Diphenhydramine-Apap) .... As Needed 10)  Nitroglycerin Cr 2.5 Mg  Cpcr (Nitroglycerin) .... As Needed 11)  Mucinex 600 Mg  Tb12 (Guaifenesin) .... Take 2 Tablets Two Times A Day As Needed 12)  Vitamin D3 1000 Unit Caps (Cholecalciferol) .Marland Kitchen.. 1 Once Daily 13)  Levaquin 750 Mg  Tabs (Levofloxacin) .... One Tablet By Mouth Daily 14)  Warfarin Sodium 2.5 Mg Tabs (Warfarin Sodium) .Marland Kitchen.. 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)     - FOB 3/98     - Rx 3/06 > 11/06 COPD (ICD-496)  - PFT's with minimal changes 12/07  - HFA 50% January 17, 2010  Diverticulosis.......................................................................................[facility].............................................Marland KitchenJacky Kindle    - Pneumovax 2/04 second shot Right DVT       - 01/2010   Vital Signs:  Patient profile:   77 year old male O2 Sat:      95 % on Room air Temp:     97.7 degrees F oral Pulse rate:   70 / minute BP sitting:   130 / 66  (left arm)  Vitals Entered By: Vernie Murders (Mar 21, 2010  10:10 AM)  O2 Flow:  Room air  Physical Exam  Additional Exam:  wt 182 > 186 November 22, 2008 > 188 September 09, 2009  > 189 January 17, 2010  amb chronically ill wm nad HEENT mild turbinate edema.  Oropharynx no thrush or excess pnd or cobblestoning.  No JVD or cervical adenopathy. Mild accessory muscle hypertrophy. Trachea midline, nl thryroid. Chest was hyperinflated by percussion with diminished breath sounds and moderate increased exp time without wheeze. Hoover sign positive at mid inspiration. Regular rate and rhythm without murmur gallop or rub or increase P2 no edema.  Abd: no hsm, nl excursion. Ext warm without calf tenderness or clubbing.     Impression & Recommendations:  Problem # 1:  BRONCHIECTASIS W/O ACUTE EXACERBATION (ICD-494.0) well compensated on present rx though risk of hemoptysis, so would dosw on low side of therapeutic  Problem # 2:  GERD (ICD-530.81)  diet, H2 reviewed  His updated medication list for this problem includes:    Hyoscyamine Sulfate 0.125 Mg Tabs (Hyoscyamine sulfate) .Marland Kitchen... Take one tablet daily as needed    Pepcid Ac Maximum Strength 20 Mg Tabs (Famotidine) ..... One at bedtime if needed for nocturnal heartburn   Each maintenance medication was reviewed in detail including most importantly the difference between maintenance and as needed and under what circumstances the prns are to be used. This was done in the context of a medication calendar review which provided the patient with a user-friendly unambiguous mechanism for medication administration and reconciliation and provides an action plan for all active problems. It is critical that this be shown to every doctor  for modification during the office visit if necessary so the patient can use it as a working document.   Orders: Est. Patient Level III (16109)  Medications Added to Medication List This Visit: 1)  Pepcid Ac Maximum Strength 20 Mg Tabs (Famotidine) .... One at bedtime if needed for  nocturnal heartburn  Patient Instructions: 1)  Keep coumadin dose on low side of therapeutic to prevent bleeding from lungs (I will let Dr Jacky Kindle know about this rec) 2)  See calendar for specific medication instructions and bring it back for each and every office visit for every healthcare provider you see.  Without it,  you may not receive the best quality medical care that we feel you deserve. 3)  Return to office in 6  months, sooner if needed

## 2010-12-20 NOTE — Assessment & Plan Note (Signed)
Summary: Pulmonary/ ext f/u ov to implement prns from med calendar   Primary [name]/Referring [name]:  [name]  CC:  Cough- the same.  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  Pt denies any significant sore throat, nasal congestion or excess secretions, fever, chills, sweats, unintended wt loss, pleuritic or exertional cp, orthopnea pnd or leg swelling.    September 08, 2009 Acute visit.  Pt c/o prod cough with yellow sputum x 1 wk.  Pt states that his breathing has been okay but he does state that lying flat on his back can make him SOB.  better on mucinex. Wife reports more hoarseness x 1 year ? advair related.  symbicort 160 two times a day   January 17, 2010 Acute visit.  Pt c/o dry cough x 2 wks.  He states that his chest feels sore from coughing.  Mucindex dm not helping. mild dysphagia.  no sob > baseline.   Mar 21, 2010 3 month followup.  Pt states that he has occ cough in the am- prod with clear to yellow sputum.  Overall cough is much improved.  He c/o waking up in the night with burning in his throat and bad taste.  He relates this to coumadin.   no hemoptysis. rec no change in rx  September 25, 2010 ov cc Cough- the same, not using any contingency meds on the list despite purulent sputum.  Pt denies any significant sore throat, dysphagia, itching, sneezing,  nasal congestion or excess secretions,  fever, chills, sweats, unintended wt loss, pleuritic or exertional cp, hempoptysis, change in activity tolerance  orthopnea pnd or leg swelling   Current Medications (verified): 1)  Synthroid 50 Mcg  Tabs (Levothyroxine Sodium) .... Take 1 Tablet By Mouth Once A Day 2)  Crestor 5 Mg  Tabs (Rosuvastatin Calcium) ....  Take One Tablet At Bedtime 3)  Cartia Xt 300 Mg  Cp24 (Diltiazem Hcl Coated Beads) .... Take 1 Tablet By Mouth Once A Day 4)  Symbicort 160-4.5 Mcg/act  Aero (Budesonide-Formoterol Fumarate) .... 2 Puffs First Thing  in Am and 2 Puffs Again in Pm About 12 Hours Later 5)  Flonase 50 Mcg/act  Susp (Fluticasone Propionate) .... Use One Spray in Each Nostril Every Day 6)  Preservision Areds   Caps (Multiple Vitamins-Minerals) .... Use One Capsule Two Times A Day 7)  Sertraline Hcl 50 Mg  Tabs (Sertraline Hcl) .... Take 1 Tablet By Mouth Once A Day 8)  Vitamin D3 1000 Unit Caps (Cholecalciferol) .Marland Kitchen.. 1 Once Daily 9)  Coumadin 1 Mg Tabs (Warfarin Sodium) .... As Directed 10)  Aspirin 81 Mg Tbec (Aspirin) .Marland Kitchen.. 1 Once Daily 11)  Hyoscyamine Sulfate 0.125 Mg  Tabs (Hyoscyamine Sulfate) .... Take One Tablet Daily As Needed 12)  Tylenol Cold Relief 12.5-500 Mg  Tabs (Diphenhydramine-Apap) .... As Needed 13)  Nitroglycerin Cr 2.5 Mg  Cpcr (Nitroglycerin) .... As Needed 14)  Mucinex 600 Mg  Tb12 (Guaifenesin) .... Take 2 Tablets Two Times A Day As Needed 15)  Levaquin 750 Mg  Tabs (Levofloxacin) .... One Tablet By Mouth Daily 16)  Pepcid Ac Maximum Strength 20 Mg Tabs (Famotidine) .... 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)     - FOB 3/98     - Rx 3/06 > 11/06 COPD (ICD-496)  - PFT's  with minimal changes 12/07  - HFA 50% January 17, 2010  Diverticulosis.......................................................................................[facility].............................................................................Marland KitchenJacky [name]    - Pneumovax 2/04 second shot Right DVT       - 01/2010  Vital Signs:  Patient profile:   77 year old male Weight:      182.25 pounds BMI:     27.01 O2 Sat:      96 % on Room  air Temp:     98.3 degrees F oral Pulse rate:   61 / minute BP sitting:   112 / 70  (left arm)  Vitals Entered By: Vernie Murders (September 25, 2010 10:45 AM)  O2 Flow:  Room air  Physical Exam  Additional Exam:  wt   186 November 22, 2008 > 188 September 09, 2009  > 189 January 17, 2010 > 182 September 25, 2010  amb chronically ill wm nad HEENT mild turbinate edema.  Oropharynx no thrush or excess pnd or cobblestoning.  No JVD or cervical adenopathy. Mild accessory muscle hypertrophy. Trachea midline, nl thryroid. Chest was hyperinflated by percussion with diminished breath sounds and moderate increased exp time without wheeze. Hoover sign positive at mid inspiration. Regular rate and rhythm without murmur gallop or rub or increase P2 no edema.  Abd: no hsm, nl excursion. Ext warm without calf tenderness or clubbing.     CXR  Procedure date:  09/25/2010  Findings:      1.  No acute cardiopulmonary process.   2.  Chronic scarring and bronchiectasis with emphysematous change.  Impression & Recommendations:  Problem # 1:  BRONCHIECTASIS W/O ACUTE EXACERBATION (ICD-494.0) I had an extended discussion with the patient today lasting 15 to 20 minutes of a 25 minute visit on the following issues:   Each maintenance medication was reviewed in detail including most importantly the difference between maintenance and as needed and under what circumstances the prns are to be used. This was done in the context of a medication calendar review which provided the patient with a user-friendly unambiguous mechanism for medication administration and reconciliation and provides an action plan for all active problems. It is critical that this be shown to every doctor  for modification during the office visit if necessary so the patient can use it as a working document.   See instructions for specific recommendations   Medications Added to Medication List This Visit: 1)  Coumadin 1 Mg Tabs (Warfarin sodium)  .... As directed 2)  Aspirin 81 Mg Tbec (Aspirin) .Marland Kitchen.. 1 once daily  Other Orders: T-2 View CXR (71020TC) Est. Patient Level IV (04540)  Patient Instructions: 1)  See calendar for specific medication instructions and bring it back for each and every office visit for every healthcare Margery Szostak you see.  Without it,  you may not receive the best quality medical care that we feel you deserve. 2)  Change aspirin to take with food in the am to see if it helps your acid reflux at night while sleeping and if it persists please use the pepcid as per the calendar (remember the ruby slippers) 3)  Return to office in 3 months, sooner if needed

## 2010-12-20 NOTE — Letter (Signed)
Summary: [facility]  [facility]   Imported By: Lester  10/17/2010 09:17:18  _____________________________________________________________________  External Attachment:    Type:   Image     Comment:   External Document

## 2010-12-20 NOTE — Progress Notes (Signed)
Summary: cxr results given  Phone Note Call from Patient Call back at Home Phone [phone]   Caller: Patient Call For: wert Reason for Call: Talk to Nurse Summary of Call: pt returning call to [facility]. Initial call taken by: Eugene Gavia,  January 18, 2010 9:54 AM  Follow-up for Phone Call        Spoke with pt and made aware of cxr results.  Also sent in rx for pred taper as per pt instructions from ov yesterday. Follow-up by: Vernie Murders,  January 18, 2010 10:43 AM    New/Updated Medications: PREDNISONE 10 MG TABS (PREDNISONE) take 4 each am x 2 days, 2 each am x 2 days, 1 each am x 2 days, then stop. Prescriptions: PREDNISONE 10 MG TABS (PREDNISONE) take 4 each am x 2 days, 2 each am x 2 days, 1 each am x 2 days, then stop.  #20 x 0   Entered by:   Vernie Murders   Authorized by:   Nyoka Cowden MD   Signed by:   Vernie Murders on 01/18/2010   Method used:   Electronically to        [id number] (retail)       [address]       [place], [place]  [address]       Ph: [phone] or [phone]       Fax: [phone]   RxID:   [id number]

## 2011-01-02 ENCOUNTER — Encounter: Payer: Self-pay | Admitting: Internal Medicine

## 2011-01-02 ENCOUNTER — Other Ambulatory Visit: Payer: Self-pay | Admitting: Internal Medicine

## 2011-01-02 ENCOUNTER — Ambulatory Visit (INDEPENDENT_AMBULATORY_CARE_PROVIDER_SITE_OTHER): Payer: Medicare Other | Admitting: Internal Medicine

## 2011-01-02 ENCOUNTER — Ambulatory Visit (INDEPENDENT_AMBULATORY_CARE_PROVIDER_SITE_OTHER)
Admission: RE | Admit: 2011-01-02 | Discharge: 2011-01-02 | Disposition: A | Discharge status: Home / Self Care | Payer: Medicare Other | Source: Ambulatory Visit | Attending: Internal Medicine | Admitting: Internal Medicine

## 2011-01-02 DIAGNOSIS — J471 Bronchiectasis with (acute) exacerbation: Secondary | ICD-10-CM

## 2011-01-09 NOTE — Assessment & Plan Note (Signed)
Summary: Pulmonary/ ext ov rec change ppi/h2 to daily    Primary Provider/Referring Provider:  Jacky Kindle  CC:  Cough- worse and now c/o hoarsness.  History of Present Illness: 77  [name] quit smoking in 1953 with minimal airflow obstruction by PFT's 12/07 and evidence of MAI infection by previous FOB (see PMHX)  November 22, 2008 ov  acutely ill x 1 week with severe cough, mucoid sputum, and generalized ant cp from coughing assoc with mild sob while coughing only.  Pt denies any significant sore throat, nasal congestion or excess secretions, fever, chills, sweats, unintended wt loss, pleuritic or exertional cp, orthopnea pnd or leg swelling.    September 08, 2009 Acute visit.  Pt c/o prod cough with yellow sputum x 1 wk.  Pt states that his breathing has been okay but he does state that lying flat on his back can make him SOB.  better on mucinex. Wife reports more hoarseness x 1 year ? advair related.  symbicort 160 two times a day   January 17, 2010 Acute visit.  Pt c/o dry cough x 2 wks.  He states that his chest feels sore from coughing.  Mucindex dm not helping. mild dysphagia.  no sob > baseline.   Mar 21, 2010 3 month followup.  Pt states that he has occ cough in the am- prod with clear to yellow sputum.  Overall cough is much improved.  He c/o waking up in the night with burning in his throat and bad taste.  He relates this to coumadin.   no hemoptysis. rec no change in rx  September 25, 2010 ov cc Cough- the same, not using any contingency meds on the list despite purulent sputum.  reviewed med cal and prns and used the ruby slipper analogy > cough improved  January 02, 2011 cc cough each am x months does not disturb sleep, minimal yellow no blood. not following instructions for contingency meds at bottom of calendar.   Pt denies any significant sore throat, dysphagia, itching, sneezing,  nasal congestion or excess secretions,  fever, chills, sweats, unintended wt loss, pleuritic or exertional cp,  hempoptysis, change in activity tolerance  orthopnea pnd or leg swelling   Current Medications (verified): 1)  Synthroid 50 Mcg  Tabs (Levothyroxine Sodium) .... Take 1 Tablet By Mouth Once A Day 2)  Crestor 5 Mg  Tabs (Rosuvastatin Calcium) .... Take One Tablet At Bedtime 3)  Cartia Xt 300 Mg  Cp24 (Diltiazem Hcl Coated Beads) .... Take 1 Tablet By Mouth Once A Day 4)  Symbicort 160-4.5 Mcg/act  Aero (Budesonide-Formoterol Fumarate) .... 2 Puffs First Thing  in Am and 2 Puffs Again in Pm About 12 Hours Later 5)  Flonase 50 Mcg/act  Susp (Fluticasone Propionate) .... Use One Spray in Each Nostril Every Day 6)  Preservision Areds   Caps (Multiple Vitamins-Minerals) .... Use One Capsule Two Times A Day 7)  Sertraline Hcl 50 Mg  Tabs (Sertraline Hcl) .... Take 1 Tablet By Mouth Once A Day 8)  Vitamin D3 1000 Unit Caps (Cholecalciferol) .Marland Kitchen.. 1 Once Daily 9)  Coumadin 1 Mg Tabs (Warfarin Sodium) .... As Directed 10)  Aspirin 81 Mg Tbec (Aspirin) .Marland Kitchen.. 1 At Bedtime 11)  Hyoscyamine Sulfate 0.125 Mg  Tabs (Hyoscyamine Sulfate) .... Take One Tablet Daily As Needed 12)  Tylenol Cold Relief 12.5-500 Mg  Tabs (Diphenhydramine-Apap) .... As Needed 13)  Nitroglycerin Cr 2.5 Mg  Cpcr (Nitroglycerin) .... As Needed 14)  Mucinex 600  Mg  Tb12 (Guaifenesin) .... Take 2 Tablets Two Times A Day As Needed 15)  Levaquin 750 Mg  Tabs (Levofloxacin) .... One Tablet By Mouth Daily 16)  Pepcid Ac Maximum Strength 20 Mg Tabs (Famotidine) .... 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)     - FOB   3/98     - Rx 3/06 > 11/06 COPD (ICD-496)  - PFT's  with minimal changes 12/07  - HFA 50% January 17, 2010  Diverticulosis.......................................................................................[facility]  Maintenance.............................................................................Marland KitchenJacky Kindle    - Pneumovax 2/04 second shot Right DVT       - 01/2010  Vital Signs:  [name] profile:   77 year old male Weight:      180 pounds O2 Sat:      97 % on Room air Temp:     97.6 degrees F oral Pulse rate:   77 / minute BP sitting:   100 / 70  (left arm)  Vitals Entered By: Vernie Murders (January 02, 2011 11:10 AM)  O2 Flow:  Room air  Physical Exam  Additional Exam:  wt   186 November 22, 2008   > 182 September 25, 2010 > 180 January 02, 2011  amb chronically ill wm nad HEENT mild turbinate edema.  Oropharynx no thrush or excess pnd or cobblestoning.  No JVD or cervical adenopathy. Mild accessory muscle hypertrophy. Trachea midline, nl thryroid. Chest was hyperinflated by percussion with diminished breath sounds and moderate increased exp time without wheeze. Hoover sign positive at mid inspiration. Regular rate and rhythm without murmur gallop or rub or increase P2 no edema.  Abd: no hsm, nl excursion. Ext warm without calf tenderness or clubbing.     CXR  Procedure date:  01/02/2011  Findings:      Comparison: 09/25/2010 and earlier.   Findings: Chronic architectural distortion in the right lung. Stable lung volumes.  Cardiac size and mediastinal contours are within normal limits.  Stable biapical scarring.  Chronic calcified granuloma is in the left lung and small calcified left prevascular nodes are stable.  Postoperative changes to the left axilla.  No pneumothorax, pulmonary edema, pleural effusion, or definite acute pulmonary opacity.  Stable exaggerated thoracic kyphosis. No acute osseous abnormality identified.   IMPRESSION: Chronic right greater than left lung disease. No superimposed acute findings are identified.  Impression & Recommendations:  Problem # 1:  BRONCHIECTASIS W/ACUTE EXACERBATION (ICD-494.1) I had an extended discussion with the [name] today  lasting 15 to 20 minutes of a 25 minute visit on the following issues:   Each maintenance medication was reviewed in detail including most importantly the difference between maintenance and as needed and under what circumstances the prns are to be used. This was done in the context of a medication calendar review which provided the [name] with a user-friendly unambiguous mechanism for medication administration and reconciliation and provides an action plan for all active problems. It is critical that this be shown to every doctor  for modification during the office visit if necessary so the [name] can use it as a working document.   Problem # 2:  GERD (ICD-530.81)  His updated medication list for this problem includes:    Hyoscyamine Sulfate 0.125 Mg Tabs (Hyoscyamine sulfate) .Marland Kitchen... Take one tablet daily as needed    Pepcid Ac Maximum Strength 20 Mg Tabs (Famotidine) ..... One  at bedtime if needed for nocturnal heartburn   may be contributing to am cough .  See instructions for specific recommendations   Medications Added to Medication List This Visit: 1)  Aspirin 81 Mg Tbec (Aspirin) .Marland Kitchen.. 1 at bedtime 2)  Levaquin 750 Mg Tabs (Levofloxacin) .... One tablet by mouth daily  Other Orders: Est. [name] Level IV (16109) Prescription Created Electronically [phone]) T-2 View CXR (71020TC)  [name] Instructions: 1)  Prilosec 30 min before bfast and pepcid 20 mg at bedtime  2)  GERD (REFLUX)  is a common cause of respiratory symptoms. It commonly presents without heartburn and can be treated with medication, but also with lifestyle changes including avoidance of late meals, excessive alcohol, smoking cessation, and avoid fatty foods, chocolate, peppermint, colas, red wine, and acidic juices such as orange juice. NO MINT OR MENTHOL PRODUCTS SO NO COUGH DROPS  3)  USE SUGARLESS CANDY INSTEAD (jolley ranchers)  4)  NO OIL BASED VITAMINS  5)  Please schedule a follow-up appointment in 6 weeks,  sooner if needed  Prescriptions: LEVAQUIN 750 MG  TABS (LEVOFLOXACIN) One tablet by mouth daily  #25 x 1   Entered and Authorized by:   Nyoka Cowden MD   Signed by:   Nyoka Cowden MD on 01/02/2011   Method used:   Electronically to        [facility]* (retail)             ,          Ph: [phone]       Fax: [phone]   RxID:   [id number]

## 2011-02-03 LAB — CK TOTAL AND CKMB (NOT AT ARMC)
CK, MB: 6.8 ng/mL (ref 0.3–4.0)
CK, MB: 6.9 ng/mL (ref 0.3–4.0)
Relative Index: 1.7 (ref 0.0–2.5)
Relative Index: 1.7 (ref 0.0–2.5)
Total CK: 394 U/L — ABNORMAL HIGH (ref 7–232)
Total CK: 417 U/L — ABNORMAL HIGH (ref 7–232)

## 2011-02-03 LAB — LIPID PANEL
Cholesterol: 117 mg/dL (ref 0–200)
LDL Cholesterol: 57 mg/dL (ref 0–99)
Triglycerides: 38 mg/dL (ref <150)
VLDL: 8 mg/dL (ref 0–40)

## 2011-02-03 LAB — URINALYSIS, ROUTINE W REFLEX MICROSCOPIC
Glucose, UA: NEGATIVE mg/dL
Nitrite: NEGATIVE
Protein, ur: NEGATIVE mg/dL
Specific Gravity, Urine: 1.007 (ref 1.005–1.030)
Urobilinogen, UA: 0.2 mg/dL (ref 0.0–1.0)

## 2011-02-03 LAB — PROTIME-INR
INR: 2.19 — ABNORMAL HIGH (ref 0.00–1.49)
INR: 2.29 — ABNORMAL HIGH (ref 0.00–1.49)
Prothrombin Time: 24.2 s — ABNORMAL HIGH (ref 11.6–15.2)
Prothrombin Time: 25 seconds — ABNORMAL HIGH (ref 11.6–15.2)

## 2011-02-03 LAB — MRSA PCR SCREENING: MRSA by PCR: NEGATIVE

## 2011-02-03 LAB — TROPONIN I

## 2011-02-03 LAB — CARDIAC PANEL(CRET KIN+CKTOT+MB+TROPI)
CK, MB: 5.6 ng/mL — ABNORMAL HIGH (ref 0.3–4.0)
CK, MB: 7 ng/mL (ref 0.3–4.0)
Relative Index: 1.7 (ref 0.0–2.5)
Total CK: 338 U/L — ABNORMAL HIGH (ref 7–232)
Total CK: 390 U/L — ABNORMAL HIGH (ref 7–232)

## 2011-02-04 LAB — BASIC METABOLIC PANEL
BUN: 13 mg/dL (ref 6–23)
CO2: 24 mEq/L (ref 19–32)
Calcium: 8.7 mg/dL (ref 8.4–10.5)
Chloride: 103 mEq/L (ref 96–112)
GFR calc Af Amer: >60 mL/min (ref >60)
Glucose, Bld: 104 mg/dL — ABNORMAL HIGH (ref 70–99)

## 2011-02-04 LAB — DIFFERENTIAL
Basophils Absolute: 0 10*3/uL (ref 0.0–0.1)
Basophils Relative: 0 % (ref 0–1)
Eosinophils Absolute: 0.1 10*3/uL (ref 0.0–0.7)
Eosinophils Relative: 2 % (ref 0–5)

## 2011-02-04 LAB — POCT CARDIAC MARKERS: CKMB, poc: 6.1 ng/mL (ref 1.0–8.0)

## 2011-02-04 LAB — CBC
MCV: 104.6 fL — ABNORMAL HIGH (ref 78.0–100.0)
Platelets: 178 10*3/uL (ref 150–400)

## 2011-02-04 LAB — APTT: aPTT: 32 seconds (ref 24–37)

## 2011-02-11 ENCOUNTER — Encounter: Payer: Self-pay | Admitting: Internal Medicine

## 2011-02-13 ENCOUNTER — Encounter: Payer: Self-pay | Admitting: Internal Medicine

## 2011-02-13 ENCOUNTER — Ambulatory Visit (INDEPENDENT_AMBULATORY_CARE_PROVIDER_SITE_OTHER): Payer: Medicare Other | Admitting: Internal Medicine

## 2011-02-13 DIAGNOSIS — R05 Cough: Secondary | ICD-10-CM

## 2011-02-13 DIAGNOSIS — J449 Chronic obstructive pulmonary disease, unspecified: Secondary | ICD-10-CM

## 2011-02-13 NOTE — Assessment & Plan Note (Signed)
Typical of UACS >>> bronchiectatic cough  As more day than night, more dry than we, assoc with hoarseness worse on DPI   Classic Upper airway cough syndrome, so named because it's frequently impossible to sort out how much is  CR/sinusitis with freq throat clearing (which can be related to primary GERD)   vs  causing  secondary (" extra esophageal")  GERD from wide swings in gastric pressure that occur with throat clearing, often  promoting self use of mint and menthol lozenges that reduce the lower esophageal sphincter tone and exacerbate the problem further in a cyclical fashion.   These are the same pts who not infrequently have failed to tolerate ace inhibitors,  dry powder inhalers or biphosphonates or report having reflux symptoms that don't respond to standard doses of PPI , and are easily confused as having aecopd or asthma flares,   For now continue rx of GERD including taking asa with bfast, not at hs.

## 2011-02-13 NOTE — Patient Instructions (Signed)
Symbicort Take 1 puffs first thing in am and then another 1 puffs about 12 hours later.   Work on inhaler technique:  relax and gently blow all the way out then take a nice smooth deep breath back in, triggering the inhaler at same time you start breathing in.  Hold for up to 5 seconds if you can.  Rinse and gargle with water when done   If your mouth or throat starts to bother you,   I suggest you time the inhaler to your dental care and after using the inhaler(s) brush teeth and tongue with a baking soda containing toothpaste and when you rinse this out, gargle with it first to see if this helps your mouth and throat.     GERD (REFLUX)  is an extremely common cause of respiratory symptoms, many times with no significant heartburn at all.    It can be treated with medication, but also with lifestyle changes including avoidance of late meals, excessive alcohol, smoking cessation, and avoid fatty foods, chocolate, peppermint, colas, red wine, and acidic juices such as orange juice.  NO MINT OR MENTHOL PRODUCTS SO NO COUGH DROPS  USE SUGARLESS CANDY INSTEAD (jolley ranchers or Stover's)  NO OIL BASED VITAMINS  See Tammy NP w/in 3 month with all your medications, even over the counter meds, separated in two separate bags, the ones you take no matter what vs the ones you stop once you feel better and take only as needed when you feel you need them.   [name]  will generate for you a new user friendly medication calendar that will put [place] all on the same page re: your medication use.     Without this process, it simply isn't possible to assure that we are providing  your outpatient care  with  the attention to detail we feel you deserve.   If we cannot assure that you're getting that kind of care,  then we cannot manage your problem effectively from this clinic.  Once you have seen [name] and we are sure that we're all on the same page with your medication use she will arrange follow up with me.

## 2011-02-13 NOTE — Assessment & Plan Note (Signed)
He really has very minimal airflow obstruction so try to reduce symbicort to one bid   The proper method of use, as well as anticipated side effects, of this metered-dose inhaler are discussed and demonstrated to the patient.

## 2011-02-13 NOTE — Progress Notes (Signed)
  Subjective:    Patient ID: [name], male    DOB: 11-19-1923, 77 y.o.   MRN: [id number]  HPI 76 yowm quit smoking in 1953 with minimal airflow obstruction by PFT's 12/07 and evidence of MAI infection by previous FOB (see PMHX)   September 08, 2009 Acute visit. Pt c/o prod cough with yellow sputum x 1 wk. Pt states that his breathing has been okay but he does state that lying flat on his back can make him SOB. better on mucinex. Wife reports more hoarseness x 1 year ? advair related. symbicort 160 two times a day   January 17, 2010 Acute visit. Pt c/o dry cough x 2 wks. He states that his chest feels sore from coughing. Mucindex dm not helping. mild dysphagia. no sob > baseline so no change rx  September 25, 2010 ov cc Cough- the same, not using any contingency meds on the list despite purulent sputum. reviewed med cal and prns and used the ruby slipper analogy > cough improved   January 02, 2011 cc cough each am x months does not disturb sleep, minimal yellow no blood. not following instructions for contingency meds at bottom of calendar cxr nochange,  so rec follow calendar but no change in rx x take asa with bfast, not hs  02/13/2011 ov cc minimal change in chronic mostly dry cough, hoarsenss, sob. Did not follow instructions as per calendar, still taking asa at hs.  Pt denies any significant sore throat, dysphagia, itching, sneezing,  nasal congestion or excess/ purulent secretions,  fever, chills, sweats, unintended wt loss, pleuritic or exertional cp, hempoptysis, orthopnea pnd or leg swelling.    Also denies any obvious fluctuation of symptoms with weather or environmental changes or other aggravating or alleviating factors.    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)  - FOB 3/98  - Rx 3/06 > 11/06  COPD (ICD-496)  - PFT's with minimal changes 12/07  - HFA 50% January 17, 2010    Diverticulosis.......................................................................................[facility]  Health Maintenance.............................................................................Marland KitchenJacky Kindle  - Pneumovax 2/04 second shot  Right DVT  - 01/2010         Review of Systems     Objective:   Physical Exam  wt 186 November 22, 2008 > 182 September 25, 2010 > 180 January 02, 2011 >  184 02/13/2011  amb chronically ill wm nad  HEENT mild turbinate edema. Oropharynx no thrush or excess pnd or cobblestoning. No JVD or cervical adenopathy. Mild accessory muscle hypertrophy. Trachea midline, nl thryroid. Chest was hyperinflated by percussion with diminished breath sounds and moderate increased exp time without wheeze. Hoover sign positive at mid inspiration. Regular rate and rhythm without murmur gallop or rub or increase P2 no edema. Abd: no hsm, nl excursion. Ext warm without calf tenderness or clubbing.  CXR        Assessment & Plan:

## 2011-02-19 ENCOUNTER — Other Ambulatory Visit: Payer: Self-pay | Admitting: Dermatology

## 2011-03-05 ENCOUNTER — Other Ambulatory Visit: Payer: Self-pay | Admitting: *Deleted

## 2011-03-05 DIAGNOSIS — E785 Hyperlipidemia, unspecified: Secondary | ICD-10-CM

## 2011-03-05 MED ORDER — ROSUVASTATIN CALCIUM 5 MG PO TABS: 5.0000 mg | ORAL_TABLET | Freq: Every day | ORAL | Status: DC

## 2011-04-02 NOTE — H&P (Signed)
[name], [name]              ACCOUNT NO.:  [id number]   MEDICAL RECORD NO.:  [id number]          PATIENT TYPE:  INP   LOCATION:  NA                           FACILITY:  [facility]   PHYSICIAN:  Marlowe Kays, M.D.  DATE OF BIRTH:  July 22, 1924   DATE OF ADMISSION:  10/13/2007  DATE OF DISCHARGE:                              HISTORY & PHYSICAL   CHIEF COMPLAINT:  Pain in my back and legs.   PRESENT ILLNESS:  This 77 year old white male is seen by [place] for  continuing problems concerning pain into his low back.  He primarily  diagnosed by Dr. Geoffry Paradise with spinal stenosis secondary to his  present illness.  MRI was performed and showed multiple levels of disc  abnormalities, particularly L2-3.  Significant stenosis was seen at that  level.  He has had a prior decompressive laminectomy superior to the  sacral area, and this was noted also on MRI.   He is an extremely active man for his age.  He enjoys sports activities,  including fishing, but finds now that he really cannot continue with  this due to his pain.  After much consideration. including the risks and  benefits of surgery, it was felt he would benefit with surgical  intervention, primarily with a decompressive lumbar laminectomy at L2-  L3.   PAST MEDICAL HISTORY:  1. This gentleman is under the care of Dr. Jacky Kindle for his internal      medicine problems,  Dr. Sherene Sires, for his pulmonology problems, and Dr.      Roger Shelter for his cardiac problems.  2. He has a history of bronchitis and pneumonia in the past, as well      as bronchiectasis.  3. He has coronary artery disease.  4. He has had squamous cell carcinoma successfully removed in the past      as well.  5. He also suffers from depression.  6. He has cataracts.   PAST SURGERIES:  1. Decompressive lumbar laminectomy in [name] 2005.  2. Carpal tunnel release of the right hand in July 2005.  3. Right total knee replacement arthroplasty in 2006.  4. Repair of  rotator cuff on the left shoulder in March 2003.   CURRENT MEDICATIONS:  1. Synthroid 50 mcg 1 in the a.m.  2. Crestor 5 mcg 1 at bedtime.  3. Diltiazem XT 300 mg 1 in the a.m.  4. Aspirin 81 mg 1 in the a.m. (will stop prior to surgery).  5. Advair 100/50 mg 1 inhalation b.i.d.  6. Flonase nasal 1 puff b.i.d.  7. Fosamax 70 mg q.Tuesday.  8. FiberCon 1 daily.  9. Pressure Vision 1 daily.  10.Vita-Super 1 daily.  11.Lutein 1 daily.  12.Calcium and magnesium dietary supplement 1 daily.  13.Move Free  dietary supplement 1 daily.  14.Sertraline hydrochloride 25 mg 1 tablet daily.   P.R.N. MEDICATIONS:  1. Hyoscyamine for cramps.  2. Tylenol for pain.  3. Nitroglycerin 0.4 for chest pain sublingually.  4. Mucinex for thick mucus.  5. Levaquin, should he have any change in mucous color.   He is allergic  to SULFA and PENICILLIN, and penicillin has caused him to  have swelling up.Marland Kitchen   FAMILY HISTORY:  Positive for heart disease in the father and the mother  as well as his sisters.   SOCIAL HISTORY:  The patient is married.  He is a Engineer, maintenance (IT).  No  recent tobacco or alcohol products.  He has two adopted children, boy  and girl.  He lives in a retirement center.   REVIEW OF SYSTEMS:  CNS:  No seizures or paralysis or double vision.  RESPIRATORY:  No productive cough, no hemoptysis, occasional shortness  of breath, but his inhalers do quite well.  CARDIOVASCULAR:  No chest  pain.  No angina or orthopnea.  GASTROINTESTINAL:  No nausea, vomiting,  melena, or bloody stool.  GENITOURINARY:  No discharge, dysuria, or  hematuria.  He does have frequency.  MUSCULOSKELETAL:  Primarily in the  present illness.   PHYSICAL EXAMINATION:  GENERAL:  Alert, cooperative, friendly, fully  oriented 77 year old white male.  VITAL SIGNS:  Blood pressure 142/64, pulse 72, respirations 12.  HEENT: Normocephalic.  PERRLA.  EOM intact.  Oropharynx is clear.  CHEST:  Clear to auscultation.  No  rhonchi or rales.  HEART:  Regular rate and rhythm.  No murmurs are heard.  ABDOMEN:  Soft, nontender.  Liver and spleen not felt.  GENITALIA/RECTAL:  Not done, not pertinent to present illness.  EXTREMITIES:  Negative straight leg bilaterally.   ADMISSION DIAGNOSES:  1. Spinal stenosis L2-3.  2. Coronary artery disease.  3. Hypothyroidism.  4. Hypertension.  5. Bronchiectasis   PLAN:  The patient will undergo decompressive lumbar laminectomy at L2-  3.  Should we have any medical problems, we will certainly contact Dr.  Jacky Kindle; any pulmonary problems, we will contact Dr. work; or any  cardiac problems, we will contact Dr. Deborah Chalk.      [name].    ______________________________  Marlowe Kays, M.D.    DLU/MEDQ  D:  10/01/2007  T:  10/02/2007  Job:  [id number]   cc:   Geoffry Paradise, M.D.  Fax: [phone]   Charlaine Dalton. Sherene Sires, MD, FCCP  [address]  [place] [address]   Colleen Can. Deborah Chalk, M.D.  Fax: [phone]

## 2011-04-02 NOTE — Op Note (Signed)
NAMEKEIRON, [name]              ACCOUNT NO.:  [id number]   MEDICAL RECORD NO.:  [id number]          PATIENT TYPE:  INP   LOCATION:  X006                         FACILITY:  [facility]   PHYSICIAN:  Marlowe Kays, M.D.  DATE OF BIRTH:  Jan 25, 1924   DATE OF PROCEDURE:  10/13/2007  DATE OF DISCHARGE:                               OPERATIVE REPORT   PREOPERATIVE DIAGNOSIS:  Spinal stenosis, L2-3.   POSTOPERATIVE DIAGNOSIS:  Spinal stenosis, L2-3, L3-4.   OPERATION:  Central foraminal decompression, L2-L3, L3-L4.   SURGEON:  Marlowe Kays, M.D.   ASSISTANT:  Georges Lynch. Darrelyn Hillock, M.D.   ANESTHESIA:  General.   JUSTIFICATION FOR PROCEDURE:  He has had prior back surgery from L3-4 to  the sacrum.  He recently has developed significant back and leg pain and  having difficulty even sitting.  Lumbar MRI with gadolinium has  demonstrated a significant high-grade stenosis at L2-3 extending down to  the L3-4 level.  Accordingly, he is here today for minimum decompression  of  L2-3, but as noted below, the stenosis actually carried distal to  this.   PROCEDURE:  Prophylactic antibiotic, satisfactory general anesthesia.  Prone position on the Wilson frame.  Back was prepped with DuraPrep,  draped in sterile field.  A timeout performed.  With two spinal needles  placed above the previous incision, I took a lateral x-ray, and based on  this, we located the spinous process of L2.  The spinous process of L3  was surgically absent.  We then continued draping the back in sterile  field, Ioban employed.   Initial incision was based on the initial x-ray, starting from L2 and  working distal-ward.  We dissected soft tissue off the spinous process  and neural arch of L2 and then dissected distally down to about the  level of L3 and then about the level of L3-4 based on a second x-ray  prior to doing a bone resection.  A Kocher clamp was placed on the  spinous process of L2 and a Penfield 4 instrument  at what turned out to  be the interspace of L3-4.  We then placed self-retaining McCullough  retractors and removed essentially all of the spinous process of L2 and  a portion of the neural arch and with Cobb elevator and curette began  removing soft tissue around the perimeter of the formal bone resection  distal to the L2-3 space.  With 2 and 3-mm Kerrison rongeurs, we then  undermined the neural arch of L2 and resected it proximal-ward until the  cephalad side of the spinal canal had been completely decompressed.  We  then began working distal-ward using the microscope for most of this  decompression and kept working as long as there was bony and very thick  ligamentum flavum compression which actually carried down just past the  interspace at L3-4, so we ended up doing a two-level decompression.  At  this time, the foramina at L3-4 and L2-3 were patent, and the canal was  opened using a hockey stick proximally and distally.  There was no  unusual bleeding.  No  dural tears.  The wound was irrigated with sterile  saline, and I covered the dura with Gelfoam soaked in thrombin.  The  self-retaining retractors were carefully removed.  Once again, there was  no unusual bleeding.  The wound was then closed in layers with  interrupted #1 Vicryl in the fascia, leaving an about 1.5 cm opening  distally for any egress of fluid.  The deep subcutaneous tissue was  closed with #1 Vicryl, superficially with 2-0 Vicryl, and staples in the  skin.  She was given 15 mg of Toradol IV.  Betadine, Adaptic, and dry  sterile pressure dressing were applied.   He was gently placed in his PACU bed and taken in satisfactory condition  with no known complications.  Estimated blood loss was 125 mL.  No blood  replacement.           ______________________________  Marlowe Kays, M.D.     JA/MEDQ  D:  10/13/2007  T:  10/13/2007  Job:  [id number]

## 2011-04-02 NOTE — Procedures (Signed)
DUPLEX DEEP VENOUS EXAM - LOWER EXTREMITY   INDICATION:  Right lower extremity pain.   HISTORY:  Edema:  No.  Trauma/Surgery:  Yes.  Pain:  No.  PE:  No.  Previous DVT:  No.  Anticoagulants:  No.  Other:   DUPLEX EXAM:                CFV   SFV   PopV  PTV    GSV                R  L  R  L  R  L  R   L  R  L  Thrombosis    o  o  o     o     o      +  Spontaneous   +  +  +     +     +      o  Phasic        +  +  +     +     +      o  Augmentation  +  +  +     +     +      o  Compressible  +  +  +     +     +      o  Competent     +  +  +     +     +      +   Legend:  + - yes  o - no  p - partial  D - decreased   IMPRESSION:  1. No evidence of deep vein thrombosis in the right lower extremity.  2. Superficial venous thrombosis noted in the right greater saphenous      and lesser saphenous vein.  3. Right greater saphenous vein thrombosis from groin to distal calf.    _____________________________  Larina Earthly, M.D.   AC/MEDQ  D:  01/04/2009  T:  01/04/2009  Job:  [id number]

## 2011-04-02 NOTE — Assessment & Plan Note (Signed)
[facility]                             PULMONARY OFFICE NOTE   NAME:[name], [name] MAGNAN                     MRN:          [id number]  DATE:09/02/2007                            DOB:          1924/04/04    PULMONARY FOLLOW UP EVALUATION:   HISTORY:  An 77 year old white male who carries a diagnosis of right  middle/lingular syndrome with previous infection by MAI, but returns  without significant complaints of cough, dyspnea, fever, chills, sweats  or intended weight loss.  He is mainly limited by chronic back and hip  problems for which he plans to see Dr. Simonne Come within the next week.   Although he has a course of cycle of Levaquin he can use, 750 for 7  days, he has not used it since his previous visit and has rarely felt  the need for Mucinex, as well.   PHYSICAL EXAMINATION:  GENERAL:  He is a frail but certainly pleasant  ambulatory white male in no acute distress.  VITAL SIGNS:  Stable.  HEENT:  Unremarkable.  Oropharynx clear.  LUNGS:  Lung fields are clear bilaterally to auscultation and  percussion.  HEART:  Regular rhythm without murmur, gallop, or rub.  ABDOMEN:  Soft, nontender.  EXTREMITIES:  Warm without calf tenderness, clubbing, cyanosis, or  edema.   CHEST X-RAY:  Chest x-ray was reviewed and shows no change in the mid  scarring bilaterally.  There is no evidence of any progressive  infiltrates or air space disease.   IMPRESSION:  Right middle lobe/lingular scarring with chronic  Mycobacterium avium-intracellulare (MAI) involvement, previously treated  with suppressive therapy, and now acting more like a bronchiectatic  with minimal flare-ups on a regimen that consists of mucolytics and  Advair with p.r.n. use of Levaquin, which I reviewed with him again  today.   He is doing so well at this point, we can see him back here in 6 months,  but certainly would be happy to see him back sooner if his symptoms do  not remain well  controlled.     Charlaine Dalton. Sherene Sires, MD, [facility]  Electronically Signed    MBW/MedQ  DD: 09/02/2007  DT: 09/03/2007  Job #: [id number]   cc:   [name], M.D.

## 2011-04-02 NOTE — Discharge Summary (Signed)
[name], [name]              ACCOUNT NO.:  [id number]   MEDICAL RECORD NO.:  [id number]          PATIENT TYPE:  INP   LOCATION:  1607                         FACILITY:  [facility]   PHYSICIAN:  Marlowe Kays, M.D.  DATE OF BIRTH:  1924-02-11   DATE OF ADMISSION:  10/13/2007  DATE OF DISCHARGE:  10/15/2007                               DISCHARGE SUMMARY   ADMITTING DIAGNOSES:  1. Spinal stenosis L2-L3.  2. Coronary artery disease.  3. Hyperthyroidism.  4. Hypertension.  5. Bronchiectasis.   DISCHARGE DIAGNOSIS:  1. Central foraminal spinal stenosis L2-L3, L3-L4.  2. Coronary artery disease.  3. Hyperthyroidism.  4. Hypertension.  5. Bronchiectasis.   OPERATION:  On October 13, 2007, the patient underwent central  foraminal decompression of L2-L3, L3-L4.  Dr. Ranee Gosselin assisted.   BRIEF HISTORY:  This is an 77 year old white male seen by [place] for  progressive problems concerning pain into his back and into his leg.  He  had a significant amount of pain.  He had difficulty sitting for long  periods of time due to the pain.  An MRI of the lumbar spine with  gadolinium shows a high-grade stenosis of L2-L3 extending into the L3-L4  level.  After much discussion and the risks and benefits were described  to the patient, he was decided to go ahead with the above procedure.   COURSE IN THE HOSPITAL:  The patient tolerated the surgical procedure  quite well.  He was markedly relieved of his leg pain postoperatively.  Due to his age and frailty, we wanted him to have physical therapy prior  to discharge.  We made these arrangements for him.  He walked with a  walker.  He was voiding.  The wound was clean and dry, and it was  decided he could be maintained in his home environments and arrangements  were made for discharge.   LABORATORY VALUES IN THE HOSPITAL:  Hematologically showed a  preoperative CBC with slight anemia.  RBC was 3.57, hemoglobin was 12.4,  hematocrit 36.3.   Blood chemistries were normal.  Urinalysis negative  for urinary tract infection.  Electrocardiogram showed sinus bradycardia  with left axis deviation and left ventricular hypertrophy with QRS  widening.  No chest x-ray seen on this chart.   CONDITION ON DISCHARGE:  Improved, stable.   PLAN:  The patient is discharged to his home.  Weight bear as tolerated.  Dry dressing p.r.n. to the back.  Return to see [place] in 2 weeks after the  date of surgery.  He may ambulate with a walker or without a walker,  depending on how he feels concerning his overall back discomfort.  He is  advised to call us should he have any problems or questions.   MEDICATIONS AT DISCHARGE:  1. Tylox for discomfort.  2. Robaxin as a muscle relaxant.  3. Advair discus 100/50 1 puff a.m.  4. Fluticasone propionate 50 mcg nasal spray 1 spray to nostril.  5. Mucinex OTC 600 mg 1-2 every 12 hours as needed.  6. Levaquin 750 mg one daily x5 days if needed.  7. Hyoscyamine sulfate 0.125 mg one daily if needed.  8. Synthroid 0.05 mg q.a.m.  9. Sertraline hydrochloride 25 mg one q.a.m.  10.Flomax q. Tuesday.  11.NTG 0.4 mg sublingual p.r.n.  12.Crestor 5 mg at bedtime.  13.Diltiazem ER 300 mg q.a.m.  14.Aspirin 81 mg q.a.m.  15.PreserVision b.i.d.  16.__________  daily.  17.Lutein 20 mg daily.  18.Calcium plus magnesium daily.  19.Move Free b.i.d.   Again, he is urged to call us should he have any problems or questions.      [name].    ______________________________  Marlowe Kays, M.D.    DLU/MEDQ  D:  11/03/2007  T:  11/03/2007  Job:  [id number]   cc:   Geoffry Paradise, M.D.  Fax: [phone]   Charlaine Dalton. Sherene Sires, MD, FCCP  [address]  [place] [address]   Colleen Can. Deborah Chalk, M.D.  Fax: [phone]

## 2011-04-02 NOTE — Procedures (Signed)
DUPLEX DEEP VENOUS EXAM - LOWER EXTREMITY   INDICATION:  Right lower extremity pain and swelling for approximately 1  week.   HISTORY:  Edema:  Right lower extremity.  Trauma/Surgery:  Right knee replacement.  Pain:  Right lower extremity.  PE:  No  Previous DVT:  SVT in greater saphenous vein and short saphenous vein  01/04/2009, however no DVT.  Anticoagulants:  Aspirin.   DUPLEX EXAM:                CFV   SFV   PopV  PTV    GSV                R  L  R  L  R  L  R   L  R  L  Thrombosis    +  0  0     0     0      +  Spontaneous   +  +  +     +     +      0  Phasic        +  +  +     +     +      0  Augmentation  +  +  +     +     +      0  Compressible  P  +  +     +     +      0  Competent     +  +  +     +     +      0   Legend:  + - yes  o - no  p - partial  D - decreased    IMPRESSION:  1. Evidence of almost complete occlusion of right greater saphenous      vein throughout its length with acute thrombus.  2. This extends slightly into the right common femoral vein.  3. All other imaged deep veins appear patent.  4. Evidence of chronic thrombus in right short saphenous vein.          _____________________________  Quita Skye. Hart Rochester, M.D.   AS/MEDQ  D:  10/31/2009  T:  11/01/2009  Job:  [id number]

## 2011-04-05 NOTE — Cardiovascular Report (Signed)
[place]  Patient:    ZEKIEL, TORIAN                       MRN: [id number] Proc. Date: 06/13/00 Adm. Date:  [id number] Attending:  Eleanora Neighbor                        Cardiac Catheterization  INDICATIONS:  Mr. Seder is a 77 year old male with known proximal left anterior descending coronary disease, who presents with a two-week history of increasing angina.  He is referred for catheterization.  PROCEDURE:  Left heart catheterization with selective coronary angiography, and left ventricular angiography.  TYPE AND SITE OF ENTRY:  Percutaneous right femoral artery.  CATHETERS:  A 6 French 4 curved Judkins right and left coronary catheters, 6 French pigtail ventriculographic catheter.  CONTRAST MATERIAL:  Omnipaque.  MEDICATIONS GIVEN PRIOR TO THE PROCEDURE:  Valium 10 mg p.o.  MEDICATIONS GIVEN DURING THE PROCEDURE:  Versed 2 mg IV.  COMMENTS:  The patient in general tolerated the procedure well.  HEMODYNAMIC DATA:  His aortic pressure pre-contrast was 129/60,  LV was 116/9 and there was no aortic valve gradient noted on pullback.  ANGIOGRAPHIC DATA: 1. Left main coronary artery:  Left main coronary artery is normal. 2. Left circumflex:  The left circumflex is a large vessel.  It has a high    obtuse marginal vessel without significant disease.  The large left    circumflex continues and has three posterolateral branches. 3. Left anterior descending:  The left anterior descending has an ostial    disease of approximately 60% nature involving the first 1 to 2 cm of the    left anterior descending.  The left anterior descending is approximately    2.5 mm in diameter and extends to and around the apex.  There is one    proximal diagonal vessel that has mild narrowing in the ostium and was the    site of a previous angioplasty but is not severely stenotic. 4. Right coronary artery:  The right coronary artery is a small dominant   vessel.  It has mild irregularities in the midportion.  No significant and    focal disease.  There is a moderate sized acute marginal vessel.  LEFT VENTRICULAR ANGIOGRAM:  Left ventricular angiogram is performed in the RAO position.  Overall cardiac size and silhouette are normal.  Left ventricular function is normal, equivocal ejection fraction is 60%.  OVERALL IMPRESSION: 1. Normal left ventricular function. 2. Proximal moderate disease in the left anterior descending with minimal    coronary atherosclerosis otherwise.  DISCUSSION:  These findings are unchanged from Mr. Rahal previous catheterizations.  He will be managed medically.  While attempting to do Perclose we noted that the femoral artery stick entered the profundus femoralis.  It did appear, however, that probably the puncture entered the anterior and posterior wall of the superficial femoral artery before entering the profundus.  Because of this, the patient will be watched overnight in the hospital for an increased risk of groin complications. DD:  06/13/00 TD:  06/14/00 Job: [id number]

## 2011-04-05 NOTE — Op Note (Signed)
[facility]  Patient:    [name], [name] Visit Number: [id number] MRN: [id number]          Service Type: Attending:  Illene Labrador. Aplington, M.D. Dictated by:   Illene Labrador. Aplington, M.D. Proc. Date: 02/04/02                             Operative Report  PREOPERATIVE DIAGNOSIS:  Torn rotator cuff, left shoulder.  POSTOPERATIVE DIAGNOSIS:  Torn rotator cuff, left shoulder.  OPERATION PERFORMED:  Anterior acromionectomy and repair of torn rotator cuff, left shoulder.  SURGEON:  Illene Labrador. Aplington, M.D.  ASSISTANT:  Mr. Idolina Primer, P.A.-C.  ANESTHESIA:  General.  PATHOLOGY AND JUSTIFICATION FOR PROCEDURE:  He developed left shoulder pain raking leaves around the end of November of last year. I saw him on December 17 and because of his health had an arthrogram performed rather than an MRI which demonstrated a full-thickness tear. The arthrogram was performed on January 15. Because of continued pain, he is here today for surgery. He has been cleared medically for the surgery. See operative description below for details of the tear.  DESCRIPTION OF PROCEDURE:  Prophylactic antibiotics, satisfactory general anesthesia, placed in the beach chair position on the Schlein frame, left shoulder girdle was prepped with Duraprep, draped in a sterile field, Ioban employed. He also had a preoperative interscalene block by anesthesia. A vertical incision was made from the [facility] joint down to the greater tuberosity. The fascia over the anterior acromion was opened in line with the skin incision. I identified the [facility] joint with the Mellody Dance needle and protecting it marked out my initial line for anterior acromionectomy with the Bovie on the acromion. I then undermined the distal acromion with small followed by large Cobb elevator and protecting the underlying rotator cuff made my initial anterior acromionectomy removing bone and coracoacromial ligament. He had a very  tight subacromial space and additional bone and I had to remove additional bone beveling inferiorly with the microsaw again protecting the underlying rotator cuff structures. He had fairly exuberant subdeltoid bursa which I resected. The tear was identified. He had a small perhaps 1 cm full-thickness tear just above the greater tuberosity but then had a large linear tear with separation into anterior and posterior partial thickness flaps going way up beneath the acromion for roughly 3 cm. After identifying the pathology, I first reapproximated the posterior flap over the anterior flap of the partial thickness torn partial rotator cuff with multiple interrupted #1 Ethibond and #1 Vicryl sutures. I then made a bony trough with 1/4 inch osteotome and small rongeur. I made two lateral drill holes through the greater tuberosity and placed one horizontal mattress suture through the two holes and the anterior and posterior flaps which allowed me to cinch the rotator cuff laterally in its regular anatomic position. Holding this tightly with the arm slightly abducted, I then reapproximated the remainder of the rotator cuff to its fairly substantial residual portion laterally with multiple interrupted #1 Ethibond suture. When this had all been secured, I tied the stabilizing suture through the bone.  This gave a nice stable repair. I checked to make sure there was no residual impingement and there was none. The wound was then irrigated with sterile saline and closure performed, the deltoid muscle in two layers with interrupted #1 Vicryl. The fascia over the anterior acromion with a good solid repair with one layer of interrupted #1 Vicryl.  The subcutaneous tissue was closed with 2-0 Vicryl, skin with staples. Betadine Adaptic dry sterile dressing and a shoulder immobilizer were applied. The patient tolerated the procedure well and was taken to the recovery room in satisfactory condition with no known  complications. Dictated by:   Illene Labrador. Aplington, M.D. Attending:  Illene Labrador. Aplington, M.D. DD:  02/04/02 TD:  02/05/02 Job: [id number]

## 2011-04-05 NOTE — Assessment & Plan Note (Signed)
[facility]                               PULMONARY OFFICE NOTE   NAME:[name], [name] CARCHI                     MRN:          [id number]  DATE:07/08/2006                            DOB:          06/30/1924    HISTORY:  A 77 year old white male with a history of bronchiectasis and MAI  last treated in November 2006, now with intermittent exacerbation, treated  with Levaquin 750 mg for 7-day cycles.  He comes in today acutely ill for  the last 2 days with episodes of hemoptysis, with about a tablespoon of  blood coughed up on each occasion (with a total of 3 tablespoons in the last  2 days).  He denies any pleuritic pain, fever or dyspnea.   For a full inventory of medications, please see face sheet updated July 08, 2006, which does include the aspirin of at 81 mg daily.   PHYSICAL EXAMINATION:  GENERAL:  He is a pleasant, ambulatory white man who  does not appear acutely ill.  VITAL SIGNS:  He has stable vital signs.  HEENT:  Oropharynx clear.  CHEST:  Lung fields reveal minimal rhonchi, right greater than left.  Overall air movement is adequate.  CARDIAC:  Regular rhythm without murmur, gallop, or rub.  ABDOMEN:  Soft, benign.  EXTREMITIES:  Warm without calf tenderness, cyanosis or clubbing.   Chest x-ray shows continued evidence of bilateral infiltrates in the right  upper lobe greater than left lower lobe.   IMPRESSION:  1. Bronchiectasis, now complicated by first episode of hemoptysis.  In      this setting I do not believe aspirin can be continued but would      recommend he stop it until he is completely free of bleeding for a full      week and then continue just one baby aspirin daily as long as no      bleeding is occurring.  If the bleeding recurs on aspirin, it will need      to be stopped indefinitely.  2. Acute exacerbations should have already been treated recognized as a      change in sputum, which is listed on his continuous  instructions with      the indication for Levaquin, 750 mg for a 7-day cycle, which I have      asked him to institute now.  3. Should the hemoptysis continue, I have asked him also to lie down on      his right side, since he senses a tickling in the right upper lobe      which corresponds to the area of greatest density on chest x-ray and      probably is the source.  If it continued, he would need to consider      hospitalization and embolization, but I do not believe this will be      required.   Follow-up will be every 3 months, sooner if needed.  [name]. [name] Sires, MD, [facility]   MBW/MedQ  DD:  07/08/2006  DT:  07/09/2006  Job #:  [id number]   cc:   [name] Paradise, MD

## 2011-04-05 NOTE — H&P (Signed)
NAMERAEDEN, SCHIPPERS NO.:  [id number]   MEDICAL RECORD NO.:  [id number]          PATIENT TYPE:  OIB   LOCATION:  2899                         FACILITY:  [facility]   PHYSICIAN:  Guadelupe Sabin, M.D.DATE OF BIRTH:  [birth year]   DATE OF ADMISSION:  04/09/2005  DATE OF DISCHARGE:                                HISTORY & PHYSICAL   DISPOSITION:  This was a planned outpatient surgical admission of this 77-  year-old white male admitted for cataract implant surgery of the left eye.   PRESENT ILLNESS:  This patient has been followed in my office since Apr 11, 1983 for routine eye care. Initial examination at that time revealed a  visual acuity of 20/20 in each eye with his myopic correction. Subsequently  over the years the patient has gradually developed cataract formation in  both eyes and very mild age-related macular degeneration. My  January 01, 2005 vision had deteriorated to 20/50 in each eye with best correction. The  patient elected to proceed with cataract implant surgery of the left eye,  first right eye later.  The patient was given oral discussion and printed  information concerning the procedure and its possible complications. He  signed an informed consent and arrangements were made for his outpatient  admission at this time.   PAST MEDICAL HISTORY:  The patient is in stable general health under the  care of Dr. Dr. Nelma Rothman notes the patient's bronchiectasis with recent  pulmonary infections treated with antibiotics approximately 4-6 weeks ago.  The patient at the present time, however, is felt to be stable. The patient  takes multiple medications at the present time and is felt to be a low risk  for the proposed surgery.   REVIEW OF SYSTEMS:  No current cardiorespiratory complaints.   PHYSICAL EXAMINATION:  VITAL SIGNS:  As recorded on admission blood pressure  138/77, pulse 59, respirations 16, temperature 97.1. GENERAL APPEARANCE:  The patient is  .  HEENT:  Eyes:  Visual acuity as noted above. Slit lamp examination of the  eyes are white and clear with a clear cornea, deep and clear anterior  chamber.  Nuclear cataract formation is present in both eyes. Dilated fundus  examination reveals a clear vitreous, attached retina with mild retinal  pigment epithelial changes in the macula of both eyes. Applanation tonometry  17 mm right eye, 16 left eye.  CHEST/LUNGS:  Clear to percussion and auscultation. No rales or rhonchi  heard.  HEART:  Normal sinus rhythm, no cardiomegaly. No murmurs.  ABDOMEN:  Negative.  EXTREMITIES:  Negative.   ADMISSION DIAGNOSIS:  Senile nuclear cataract both eyes.   SURGICAL PLAN:  Cataract implant surgery left eye now, right eye later.      HNJ/MEDQ  D:  04/09/2005  T:  04/09/2005  Job:  [id number]   cc:   Charlaine Dalton. Sherene Sires, M.D. [facility]

## 2011-04-05 NOTE — H&P (Signed)
NAME:  [name], GAUNT NO.:  [id number]   MEDICAL RECORD NO.:  [id number]                  PATIENT TYPE:   LOCATION:                                       FACILITY:   PHYSICIAN:  Dooley L. Idolina Primer, P.A.           DATE OF BIRTH:  01-Sep-1924   DATE OF ADMISSION:  04/23/2004  DATE OF DISCHARGE:                                HISTORY & PHYSICAL   CHIEF COMPLAINT:  Pain in my legs with ambulation, and some in my back.   PRESENT ILLNESS:  This 77 year old white male was seen by Dr. Simonne Come for  continuing and progressive problems concerning his low back.  The patient  has 2 problems at the current time.  One is DJD of the right knee but his  most significant problem is with spinal stenosis and lower extremity pain.  He is a very active gentleman who is really finding himself slowing down  secondary to this discomfort.  There are not many conservative options to be  used in the face of this severe spinal stenosis.  Myelogram and CT scan  showed complete myelographic block at L3.  CT scan showed stenosis at L4-L5  with left lateral recess at L5-S1, multifactorial spinal stenosis at L2-L3  and L3-L4.  Due to the fact that this patient's overall life style has  markedly been compromised with his stenosis and pain in the lower  extremities, particularly with walking it was felt that he would benefit  with surgical intervention and have a central decompressive lumbar  laminectomy from L2 to S1.  Dr. Simonne Come has discussed his postoperative  course with him.   PAST MEDICAL HISTORY:  Actually, this gentleman has been in relatively good  health throughout his life time.  Dr. Deborah Chalk is his cardiologist; and he  had a heart catheterization and angioplasties in March of 1987.  Other  surgeries include repair of a rotator cuff of the left shoulder by Dr.  Simonne Come and myself a couple of years ago.  He has done well with that.  Medically, the patient has  diverticulitis and, a hemorrhoid.  He also has  cardiovascular disease.  Dr. Geoffry Paradise is his regular family  physician.   ALLERGIES:  He is allergic to PENICILLIN and SULFA PRODUCTS.   CURRENT MEDICATIONS:  1. Cardia XT 300 cap 1 daily.  2. Synthroid tabs 50 mcg 1 daily.  3. Zetia 10 mg tab 1 daily.  4. Mobic 7.5 tab 1 b.i.d. (it was stopped on 04/13/04).  5. Prevacid 30 mg cap 1 daily.  6. Advair Diskus 100/50 one puff b.i.d.  7. Flonase nasal spray 50 mcg p.r.n.  8. Tylenol p.r.n.   FAMILY HISTORY:  Positive for heart disease in the mother and father.   SOCIAL HISTORY:  The patient is married. He is a retired Associate Professor.  He  has no intake of tobacco or alcohol products.  He has 2 children.  His  caregiver after surgery will be his wife, [name]. We would also recommend  [facility].   REVIEW OF SYSTEMS:  CNS:  No seizure disorder problems.  Normal __________  vision.  The patient has leg pain with standing and ambulation consistent  with stenosis.  CARDIOVASCULAR:  No chest pain, no angina, no orthopnea.  RESPIRATORY:  No productive cough, no hemoptysis, no shortness of breath.  GASTROINTESTINAL:  No nausea, vomiting, or melanotic stool.  GENITOURINARY:  No discharge or hematuria.  MUSCULOSKELETAL:  Primarily as under present  illness.   PHYSICAL EXAMINATION:  GENERAL:  An alert, cooperative and friendly, 77-year-  old white male who is accompanied by his wife, [name].  VITAL SIGNS:  Blood pressure 122/66, pulse 64, respirations 12.  HEENT:  Noncontributory.  PERLA.  EOM intact.  Oropharynx is clear.  CHEST:  Clear to auscultation.  No rhonchi, no rales.  HEART:  Regular rate and rhythm.  No murmurs are heard.  ABDOMEN:  Soft and nontender.  Liver and spleen not felt.  GENITALIA AND RECTAL:  Not done; not pertinent for present illness.  EXTREMITIES:  Negative straight leg raising bilaterally.  No gross motor  weakness.   ADMITTING DIAGNOSIS:  1.  Spinal stenosis through sacrum.  2. History of bronchitis and pneumonia.  3. Coronary artery vessel disease.  4. Diverticulitis.  5. Hypothyroid.  6. Gastroesophageal reflux disease.   PLAN:  The patient will be admitted to [facility] for central  decompressive lumbar laminectomy from L2 to sacrum.  Should he have any  medical problems we will certainly ask Dr. Geoffry Paradise to follow along  with [place] during the patient's hospitalization.  Should he have a cardiac  event we will call Dr. Roger Shelter.                                               [name].    DLU/MEDQ  D:  04/17/2004  T:  04/17/2004  Job:  [id number]   cc:   Marlowe Kays, M.D.  [address]  [place]  [place] [address]  Fax: [phone]   Geoffry Paradise, M.D.  [address]  [place]  [place] [address]  Fax: [phone]   Druscilla Brownie. Cherlynn June.   Colleen Can. Deborah Chalk, M.D.  Fax: [phone]

## 2011-04-05 NOTE — Assessment & Plan Note (Signed)
[facility]                               PULMONARY OFFICE NOTE   NAME:[name], [name]                     MRN:          [id number]  DATE:09/19/2006                            DOB:          Mar 16, 1924    HISTORY:  An 77 year old white male, remote smoker, with documented  bronchiectasis with MAI last treated in November 2006 with intermittent  exacerbations, cough, sputum, controlled with Levaquin and p.r.n.  Mucolytics.  He is maintained on Advair at 1 inhalation b.i.d. and Flonase 1  b.i.d. but is not consistent about using the Flonase and comes in today  complaining of morning cough and congestion that he attributes to drainage  down my throat.  What he coughs up is a minimum amount and white.  He  denies any recurrent hemoptysis, rib  pain, or significant dyspnea with  activities of daily living but is not aerobically active.   Note that previously I asked him to hold aspirin because he was having new-  onset hemoptysis, and he has been able to restart the aspirin with no  further hemoptysis since his last exacerbation treated with 7-day course of  Levaquin back several months ago now.   MEDICATIONS:  For formal review of medications, please see face sheet dated  September 19, 2006.   PHYSICAL EXAMINATION:  GENERAL:  Pleasant white male in no acute distress.  VITAL SIGNS: Stable.  HEENT:  Moderate turbinates and oropharynx clear.  There is no evidence of  crusting, postnasal drainage.  There is minimal cobblestoning.  NECK:  Supple without cervical adenopathy.  Trachea is midline.  CHEST: Lung fields reveal minimal rhonchi bilaterally, adequate air movement  overall.  HEART:  Regular rhythm without murmur or gallop.  ABDOMEN:  Soft.  EXTREMITIES:  Without calf tenderness, cyanosis, clubbing, or edema.   Chest x-ray is pending.   Heme saturation 94% on room air.   IMPRESSION:  1. Chronic obstructive pulmonary disease and bronchiectasis  with adequate      control of airway inflammation and bronchospasm with low doses of      Advair.  2. Hemoptysis in August has not recurred and probably related to low-grade      Mycobacterium avium-intracellulare (MAI)  and acute bacterial      superinfection and bronchiectatic airways.  One option might be to      place him back on chronic MAI therapy, but this would was done twice      without full eradication.  He seems to have done just as well using      p.r.n. Levaquin.  Therefore, I plan to follow him up with a set of PFTs      in 6 weeks to make sure the air flow obstruction component is      adequately addressed, and after that every 3 months with extensive list      of contingency medications reviewed with the patient on each visit.  3. In terms of his postnasal drainage at bedtime, he would benefit I      believe from following instructions he was given and use  the Flonase      b.i.d. including a bedtime dose.    ______________________________  Charlaine Dalton. Sherene Sires, MD, [facility]    MBW/MedQ  DD: 09/19/2006  DT: 09/19/2006  Job #: [id number]   cc:   Geoffry Paradise, M.D.

## 2011-04-05 NOTE — Discharge Summary (Signed)
[name], [name]              ACCOUNT NO.:  [id number]   MEDICAL RECORD NO.:  [id number]          PATIENT TYPE:  INP   LOCATION:  1514                         FACILITY:  [facility]   PHYSICIAN:  Marlowe Kays, M.D.  DATE OF BIRTH:  [birth year]   DATE OF ADMISSION:  07/16/2005  DATE OF DISCHARGE:  07/23/2005                                 DISCHARGE SUMMARY   ADMISSION DIAGNOSES:  1.  Severe osteoarthritis of the right knee.  2.  Hypothyroidism.  3.  Coronary artery disease.  4.  Recurrent Mycobacterium avium.  5.  Diverticulitis.  6.  Chronic prostatitis.   DISCHARGE DIAGNOSES:  1.  Severe osteoarthritis of the right knee.  2.  Hypothyroidism.  3.  Coronary artery disease.  4.  Recurrent Mycobacterium avium.  5.  Diverticulitis.  6.  Chronic prostatitis.  7.  Postoperative anemia treated with transfusion.  8.  Hyponatremia (treated).   OPERATION:  On July 16, 2005, the patient underwent [employer] total knee  replacement arthroplasty with all three components cemented.  Jenne Campus,  PA-C assisted.   BRIEF HISTORY:  This 77 year old white male seen by [place] concerning problems  regarding his right knee.  His right knee became most problematic despite  having back pain to the point where he highly desires to have some sort of  intervention to the knee.  He has difficulty getting about due to his pain  and discomfort.  Crepitus on range of motion and x-ray showing end-stage  osteoarthritis.  Due to the severe nature of the osteoarthritis, the patient  highly desires to have surgical intervention.  After the risks and benefits  of surgery were explained to the patient, the above surgery was scheduled.  He was cleared preoperatively by Dr. Geoffry Paradise, his internal medicine  physician.   HOSPITAL COURSE:  The patient tolerated the surgical procedure quite well.  He had a considerable amount of sensitivity to the analgesics given to him  postoperatively for this surgical  procedure.  He had some sundowning.  We  checked his electrolytes.  He did have some very mild hyponatremia.  This  was treated by restriction of water.  Also noted to have postoperative  anemia with a hemoglobin dropping to 8.3.  After 2 units of bank blood, it  came up to 10.2.   The patient participated with physical therapy, became more awake and alert  and less dizzy after his transfusion.  It was thought that he might benefit  from some rehabilitation admission for an inpatient program, but he did so  well in his last day or two ambulating 150 feet in the hall with only  moderate assist, it was felt he could be maintained in his home environment  with home health.  We therefore cast our direction towards home health.   The patient was placed on Coumadin protocol postoperatively for the  prevention of DVT.   On the day of discharge, the wound was clean and dry.  He was flexing quite  well.  He was ambulating and using a walker, getting out of bed by himself  and out  of bed by chair by himself.  It is felt it was safe for him to  return to his home environment with home health.  All of this was explained  to Ms. Jarvis Newcomer as well.   Laboratory values in the hospital hematologically showed a preoperative CBC  with a very mild anemia, hemoglobin is 12.2, hematocrit 34.7.  Final  hemoglobin was 10.2, and hematocrit was 29.3.  Blood chemistries preop were  normal.  Postop, he had a mild hyponatremia and hypokalemia.  Urinalysis  negative for urinary tract infection.   No chest x-ray or electrocardiogram is seen in this chart.   CONDITION ON DISCHARGE:  Improved, stable.   PLAN:  Patient is discharged to his home in the care of his family with home  health.  Dry dressing p.r.n. to the knee may be done as indicated.  Continue  with weightbearing as tolerated.  He will continue with Coumadin protocol  six weeks after the date of surgery.  We will rely on the pharmacist at  [facility] to  maintain his INR around 2.   DISCHARGE MEDICATIONS:  Robaxin 500 mg per muscle relaxer, Vicodin for pain,  and a Coumadin prescription was sent with the patient.   Should he have any problems or questions, he is encouraged to call our  office.      [name].    ______________________________  Marlowe Kays, M.D.    DLU/MEDQ  D:  07/30/2005  T:  07/30/2005  Job:  [id number]

## 2011-04-05 NOTE — Op Note (Signed)
NAMEHAEDEN, HUDOCK              ACCOUNT NO.:  [id number]   MEDICAL RECORD NO.:  [id number]          PATIENT TYPE:  OIB   LOCATION:  NA                           FACILITY:  [facility]   PHYSICIAN:  Guadelupe Sabin, M.D.DATE OF BIRTH:  [birth year]   DATE OF PROCEDURE:  04/09/2005  DATE OF DISCHARGE:                                 OPERATIVE REPORT   PREOPERATIVE DIAGNOSIS:  Senile nuclear cataract, left eye.   POSTOPERATIVE DIAGNOSIS:  Senile nuclear cataract, left eye.   OPERATION:  Planned extracapsular cataract extraction - phacoemulsification,  primary insertion of posterior chamber intraocular lens implant.   SURGEON:  Guadelupe Sabin, M.D.   ASSISTANT:  Nurse.   ANESTHESIA:  Local 4% Xylocaine, 0.75 Marcaine retrobulbar block with Wydase  added, topical tetracaine, intraocular Xylocaine.  Anesthesia standby  required in this 77 year old patient. The patient was given sodium Pentothal  intravenously during the period of retrobulbar blocking.   OPERATIVE PROCEDURE:  After the patient was prepped and draped, a lid  speculum was inserted in the left eye. Schiotz tonometry was recorded at 12  scale units with a 5.5 grams weight. A peritomy was performed adjacent to  the limbus from the 11 to the 1 o'clock position. The corneoscleral junction  was cleaned and a corneoscleral groove made with a 45 degree Superblade.  The anterior chamber was then entered with the 2.5 mm diamond keratome at  the 12 o'clock position and the 15 degree blade at the 2:30 position.  Using  a bent 26 gauge needle on an OcuCoat syringe, a circular capsulorrhexis was  begun and then completed with the Grabow forceps. Hydrodissection and  hydrodelineation were performed using 1% Xylocaine. The 30 degree  phacoemulsification tip was then inserted with slow controlled  emulsification of the lens nucleus with back cracking with the Bechert pick.  Total ultrasonic time 56 seconds, average power level 15%,  total amount of  fluid used 80 cc. Following removal of the nucleus, the residual cortex was  aspirated with the silicone tipped irrigation-aspiration probe. The  posterior capsule appeared intact with a brilliant red fundus reflex. It was  therefore elected to insert an [employer]er[employer] medical optics SI 40NB silicone  three-piece posterior chamber intraocular lens implant, diopter strength  +17.50. This was inserted with the McDonald forceps into the anterior  chamber and then centered into the capsular bag using the [facility] lens  rotator. The implant was then centered into the capsular bag with the  Sinskey hook. There was slight bleeding on the iris as the haptics were  swept across the iris surface and into the capsular bag. The anterior  chamber was irrigated to remove the hemorrhage.  Miochol ophthalmic solution  was injected in the anterior chamber with balanced salt solution to replace  the Provisc and OcuCoat which had been used intermittently during the  procedure.  The operative incisions appeared to be self-sealing. It was  however elected to place a single 10-0 interrupted nylon suture across  the larger 12 o'clock incision to ensure closure and to prevent  endophthalmitis.  Pilopine gel was inserted in the  conjunctival cul-de-sac  along with Maxitrol ointment. Duration of procedure 45 minutes. The patient  tolerated the procedure well in general, left the operating room for the  recovery room in good condition.      HNJ/MEDQ  D:  04/09/2005  T:  04/09/2005  Job:  [id number]

## 2011-04-05 NOTE — H&P (Signed)
[facility]  Patient:    [name], [name] Visit Number: [id number] MRN: [id number]          Service Type: Attending:  Illene Labrador. Aplington, M.D. Dictated by:   Alexzandrew L. Perkins, P.A.-C. Adm. Date:  02/04/02   CC:         Richard A. Jacky Kindle, M.D.  Colleen Can. Deborah Chalk, M.D.  Maretta Bees. Vonita Moss, M.D.   History and Physical  DATE OF OFFICE VISIT HISTORY AND PHYSICAL:  January 28, 2002.  CHIEF COMPLAINT:  Left shoulder pain.  HISTORY OF PRESENT ILLNESS:  The patient is a 77 year old right-hand dominant male who has been seen and evaluated for left shoulder pain.  He has been evaluated by Dr. Simonne Come and sent for an MRI arthrogram of the left shoulder due to complaints of persistent pain.  The left shoulder arthrogram did show a small tear in the rotator cuff.  He was seen in the office and has had continued pain requiring narcotic analgesics.  It was felt he would benefit from undergoing surgical intervention.  The patient has been seen preoperatively by Dr. Delfin Edis and cleared from a cardiac standpoint to undergo surgery.  The patient is subsequently admitted to the hospital.  ALLERGIES:  PENICILLIN causes a rash.  SULFA causes a rash.  CURRENT MEDICATIONS: 1. Prevacid 30 mg q.d. 2. Isosorbide 120 mg q.d. 3. Synthroid 0.05 mg, 1 q.d. 4. Diltiazem 300 mg, 1 q.d. 5. Lescol 80 mg, 1 q.d. 6. Vioxx 25 mg daily, stopped prior to surgery. 7. Fosamax 70 mg weekly.  Will not be continued while in the hospital. 8. Aspirin 81 mg daily, stopped prior to surgery. 9. Citrucel 1 tablespoon daily.  PAST MEDICAL HISTORY: 1. History of pneumonia in February 1998. 2. Coronary arterial disease. 3. Diverticulosis. 4. Angina. 5. History of bronchiectasis. 6. Hypothyroidism. 7. History of a superficial venous clot in the leg.  PAST SURGICAL HISTORY: 1. Right knee arthroscopy in May 1985. 2. Multiple cardiac catheterizations, approximately  five. 3. Coronary angioplasty in the past.  SOCIAL HISTORY:  The patient is married.  He is retired from [employer] . Two children.  His wife will be at home assisting in his care postoperatively. Denies use of tobacco products or alcohol products.  PHYSICIANS:  His medical physician is Dr. Geoffry Paradise.  His cardiologist is Dr. Delfin Edis.  His urologist is Dr. Larey Dresser.  FAMILY HISTORY:  Mother and father both deceased:  Mother deceased age 18 and father deceased age 69, heart disease with both parents.  REVIEW OF SYSTEMS:  GENERAL:  No fevers, chills, or night sweats.  NEUROLOGIC: No seizures, syncope, or paralysis.  RESPIRATORY:  No shortness of breath, productive cough, or hemoptysis.  CARDIOVASCULAR:  The patient does have a history of chest pain/angina associated with his coronary arterial disease. He denies any recent chest pain, angina, or orthopnea.  GASTROINTESTINAL:  No nausea, vomiting, diarrhea, or constipation.  No blood or mucous in the stool. GENITOURINARY:  No dysuria, hematuria, or discharge.  MUSCULOSKELETAL: Pertinent to that of the left shoulder found in history of present illness.  PHYSICAL EXAMINATION:  VITAL SIGNS:  Pulse 62, respirations 12, blood pressure 124/60.  GENERAL:  The patient is a 77 year old male, well-nourished, well-developed. Appears to be in no acute distress.  He is alert, oriented, and cooperative.  HEENT:  Normocephalic, atraumatic.  Pupils are round and reactive.  The patient is known to wear glasses.  NECK:  Supple.  CHEST:  Clear to auscultation in  anterior and posterior chest walls.  No rhonchi or rales or wheezing appreciated.  HEART:  Regular rate and rhythm.  ABDOMEN:  Soft and nontender.  Bowel sounds present.  RECTAL, BREASTS, GENITOURINARY:  Not done, not pertinent to present illness.  EXTREMITIES:  Limited to that of the left upper extremity.  The patient has pain on abduction with the left shoulder  with impingement signs.  He has noticed some weakness against resistance.  Sensation is intact throughout the left upper extremity.  Motor function is grossly intact.  The patient has significant pain with internal rotation.  IMPRESSION: 1. Left rotator cuff tear. 2. Angina. 3. Coronary arterial disease. 4. Diverticulosis. 5. Hypothyroidism. 6. History of pneumonia in 1998. 7. History of bronchiectasis. 8. History of a superficial venous clot in the right leg.  PLAN:  The patient will be admitted to [facility] to undergo left shoulder rotator cuff repair per Dr. Simonne Come.  The patients medical physician, Dr. Geoffry Paradise, and his cardiologist, Dr. Delfin Edis, who cleared him for surgery, will both be notified of the room number on admission and will be consulted if needed for any medical assistance with this patient throughout the hospital course. Dictated by:   Alexzandrew L. Perkins, P.A.-C. Attending:  Illene Labrador. Aplington, M.D. DD:  01/29/02 TD:  01/30/02 Job: [id number]

## 2011-04-05 NOTE — Op Note (Signed)
[name], [name]              ACCOUNT NO.:  [id number]   MEDICAL RECORD NO.:  [id number]          PATIENT TYPE:  AMB   LOCATION:  DSC                          FACILITY:  [facility]   PHYSICIAN:  Currie Paris, M.D.DATE OF BIRTH:  [birth year]   DATE OF PROCEDURE:  08/23/2005  DATE OF DISCHARGE:                                 OPERATIVE REPORT   OFFICE MEDICAL RECORD NUMBER [id number]   PREOPERATIVE DIAGNOSIS:  Left axillary adenopathy.   POSTOPERATIVE DIAGNOSIS:  Left axillary adenopathy.   OPERATION:  Excision of left axillary lymph nodes.   SURGEON:  Currie Paris, M.D.   ANESTHESIA:  General (LMA).   CLINICAL HISTORY:  [name] is a 77 year old gentleman recently found  to have enlarged left axillary adenopathy.  By physical, it was worrisome  for lymphoma.  Chest x-ray was unremarkable.  After discussion with the  patient and his wife, we elected to do excisional biopsy.   DESCRIPTION OF PROCEDURE:  The patient was seen in the holding area and had  no further questions.  The left axillary area was identified as the  operative site and initialed by me.   The patient was taken to the operating room and after satisfactory general  anesthesia had been obtained, the left axillary area was prepped and draped.  The time-out occurred.   The lymph node was actually visible under the subcu and I injected some  0.25% plain Marcaine and I then made an incision over it.  I identified and  got down to the inferior aspect of the lymph node and using cautery, I was  able to free it up circumferentially and then using blunt dissection, pull  it down, as it went fairly high up into the axilla as a group of matted  nodes.  There was some cystic component to it, as little cystic fluid leaked  out.  We used clips on bleeders as well as cautery and suture ligatures.  Once this was completely removed, I irrigated and made sure everything was  dry.  I put additional local in.  This  was very large node and I was left  with a fairly significant cavity, so I elected to put a 19 Blake drain in,  which was brought in through a separate stab wound.  Again, a check was  made for hemostasis after the drain was placed and then the incision closed  with 3-0 Vicryl, 4-0 Monocryl subcuticular, and Dermabond on the skin.   The patient tolerated the procedure well.  There no operative complications  and all counts were correct.      Currie Paris, M.D.  Electronically Signed     CJS/MEDQ  D:  08/23/2005  T:  08/23/2005  Job:  [id number]   cc:   Geoffry Paradise, M.D.  Fax: [phone]

## 2011-04-05 NOTE — Op Note (Signed)
NAMELEELAND, [name]              ACCOUNT NO.:  [id number]   MEDICAL RECORD NO.:  [id number]          PATIENT TYPE:  INP   LOCATION:  0008                         FACILITY:  [facility]   PHYSICIAN:  Marlowe Kays, M.D.  DATE OF BIRTH:  08-17-1924   DATE OF PROCEDURE:  07/16/2005  DATE OF DISCHARGE:                                 OPERATIVE REPORT   PREOPERATIVE DIAGNOSIS:  Osteoarthritis right knee.   POSTOPERATIVE DIAGNOSIS:  Osteoarthritis right knee.   OPERATION:  Osteonics total knee replacement, right.   SURGEON:  Marlowe Kays, M.D.   ASSISTANT:  Mr. Idolina Primer, P.A.-C.   ANESTHESIA:  General.   PATHOLOGY AND JUSTIFICATION FOR PROCEDURE:  He had significant  osteoarthritis, tricompartmental with pain and a 15 degree proximal flexion  contracture of the knee. Other modalities have been tried without success.   DESCRIPTION OF PROCEDURE:  Prophylactic antibiotics, satisfactory spinal  anesthesia, Foley catheter inserted, pneumatic tourniquet, lateral hip  positioner, SureFoot, right leg was prepped with DuraPrep from tourniquet to  ankle and draped in a sterile field. A vertical midline incision down to the  patellar mechanism with median parapatellar incision to open the joint. He  had extensive osteophytes around the femur and patella which were removed  and also white chalky type material encrusting a good part of the joint  which was either calcium or a remnant of ground up articular cartilage. I  undermined the pes anserinus and medial collateral ligament, free up the  patellar mechanism and then flexed the knee and everted the patella. Most of  the ACL and PCL were removed and the anterior portions of both menisci were  freed. I then placed a 5/16 inch drill hole in the distal femur followed by  the canal finder and then the axis liner set for 5 a degree valgus cut for  the right knee. Because of the flexion contracture, I  took 12 mm off the  distal femur. I then  sized the distal femur at a #11 and using the sizing  jig and scribe lines which were then placed and the guide for creating the  anterior and posterior cuts and posterior and anterior chamfering's was then  applied and the cuts made. I then went to the tibia where I made my initial  leveling cut. On trying to get full view of the tibia with Crego retractors,  there was a partial avulsion fracture of a small amount of the medial  condyle which I subsequently repaired later with a through-and-through  mattress suture of #1 Vicryl. After sizing the tibia at either an 11 or a  13, I used the 11 trial tray to make my initial intramedullary hole followed  by a step-cut drill canal finder and then the intramedullary guide using the  jig set at zero degrees for 4 mm cut off the depressed medial tibial  plateau. After this cut was made, I used the laminar spreader and removed  remnants of bone and menisci from the posterior femur. I then placed the  guide for creating the patellar groove which was made and then used a  microsaw to make some small cuts in the intercondylar area removing bone to  minimize the risk for fracture and then used the cutting and impacting  appliances to create the notchplasty. I then went through a trial reduction  and found either a 10 or 12-mm spacer would be ideal and also that a size 13  tray would be appropriate. I used the extramedullary rod splitting the  bimalleolar distance to seek the appropriate tray position. This was also  where the knee was happy in terms of flexion/extension. Marks were placed on  the anterior tibia at this location and I then went to the patella where  measuring of the 28 mm patella seemed to be the appropriate size. I used the  recess cutting guide to make a 10-mm recessed cut followed by the guide for  making 3 fixation drill holes. I then placed the trial patella trimming bone  from around the perimeter until the prosthesis was lying well  above the  surrounding bone. I then returned to the tibia and once again sized the  tibia at the 13 placing a 13 base plate fixing it to the tibia and then  using next the tripod apparatus to ream for the stem of the tibial component  up to a 13 cemented. We then water picked the knee while the components were  being opened and we then individually glued in the 3 components. Because of  the technique of the striker apparatus, I used a #13 tray for the tibia  which we impacted tightly but subsequently used an 11 insert. I then glued  in the femur removing excess methyl methacrylate and held the knee in  extension while we glued in the patellar holding it with a patellar clamp.  When the glue had hardened, we trimmed up small amounts of methacrylate from  around the components and went through another trial reduction, this time  with a 12 mm size 11 posterior stabilized insert which seemed to be the  ideal fit. Consequently, this is a prosthesis we used. The wound was once  again irrigated well, checked for debris and the prosthesis inserted and  impacted tightly. The knee was taken through full range of motion and was  nice and stable.  No lateral release was required for the patella. A Hemovac  was placed and the wound was closed in layers with interrupted #1 Vicryl and  the synovium and capsule in two layers, and two layers in the quadriceps  tendon, 2-0 Vicryl subcutaneous tissue, staples in the skin. The tourniquet  was released at 2 hours of tourniquet time just as we were closing the skin.  Betadine Adaptic dry sterile dressing were applied followed by a knee  immobilizer. He tolerated the procedure well and was taken to the recovery  room in satisfactory condition with no known complications. No blood loss.           ______________________________  Marlowe Kays, M.D.     JA/MEDQ  D:  07/16/2005  T:  07/16/2005  Job:  [id number]

## 2011-04-05 NOTE — H&P (Signed)
NAMEGLENROY, [name]              ACCOUNT NO.:  [id number]   MEDICAL RECORD NO.:  [id number]          PATIENT TYPE:  INP   LOCATION:  NA                           FACILITY:  [facility]   PHYSICIAN:  Marlowe Kays, M.D.  DATE OF BIRTH:  23-Nov-1923   DATE OF ADMISSION:  07/06/2005  DATE OF DISCHARGE:                                HISTORY & PHYSICAL   CHIEF COMPLAINT:  Pain in my right knee.   HISTORY OF PRESENT ILLNESS:  This 77 year old, white male seen by [place] for  continuing progressive problems concerning pain into his left knee.  He has  had office visits for pain into his lumbar spine secondary to spinal  stenosis.  He is still having that discomfort, but his right knee is his  most problematic area and is markedly interfering with his ambulation,  getting about, coming from a seated to standing position.  He has had a knee  arthroscopy to this knee, but is now desiring a more definitive surgical  procedure.  We have decided after much discussion, including risks and  benefits of surgery, it was decided the patient would benefit with surgical  intervention and is being scheduled for a right total knee replacement  arthroplasty.  He has been cleared preoperatively by Dr. Geoffry Paradise.  His pulmonologist is Dr. Sherene Sires.   ALLERGIES:  PENICILLIN and SULFA.  According to his history sheet, the  patient has no medical allergies.   CURRENT MEDICATIONS:  1.  Synthroid 50 mcg one q.a.m.  2.  Crestor 5 mg one at bedtime.  3.  Diltiazem XT 300 mg one daily.  4.  Mobic 7.5 mg one b.i.d. (has stopped that medication).  5.  Advair Diskus 100/50 one inhalation b.i.d.  6.  Flonase nasal one puff daily.  7.  Fosamax 70 mg every Tuesday.  8.  Azithromycin 250 mg one daily.  9.  Ethambutol 400 mg one b.i.d.  10. Levaquin 500 mg one daily (he takes the antibiotics for a residual and      continuing pulmonary problem).  11. FiberCon one daily.  12. B6 Vitamin.  13. Glucosamine chondroitin one  daily.  14. Aspirin 81 mg daily.  15. NitroQuick 4 mg p.r.n.  16. Tylenol, arthritis strength, p.r.n.   PAST MEDICAL HISTORY:  1.  Continuing pulmonary reinfection being followed closely by Dr. Sherene Sires for      Mycobacterium avium.  Last episode of pneumonia November 05, 2004, with      bronchitis.  2.  Coronary artery disease.  3.  Diverticulitis.  4.  Prostatitis.  5.  Hypothyroid.  6.  Osteoarthritis.   PAST SURGICAL HISTORY:  1.  Arthroscopic surgery of the right knee in 1985.  2.  Repair of rotator cuff of the left shoulder in March 2003.  3.  Lumbar stenosis treated with lumbar laminectomy and decompression in      June 2005.  4.  Carpal tunnel release of his right hand in July 2005.   FAMILY HISTORY:  Positive for heart disease in both mother and father.  Diabetes in some of his aunts.  SOCIAL HISTORY:  The patient is married.  He is a retired Associate Professor.  He has no intake of alcohol or tobacco products.  He has two adopted  children.  His spouse will be caregiver after surgery.   REVIEW OF SYSTEMS:  NEUROLOGIC:  No seizure disorder, paralysis, numbness,  double vision.  RESPIRATORY:  No productive cough, hemoptysis or shortness  of breath.  CARDIOVASCULAR:  The patient has intermittent angina, but none  recently.  GASTROINTESTINAL:  No nausea, vomiting, melena or bloody stools.  GENITOURINARY:  No discharge, dysuria or hematuria.  MUSCULOSKELETAL:  Primarily in history of present illness with his right knee.   PHYSICAL EXAMINATION:  GENERAL:  Alert, cooperative, friendly, fully alert,  well-dressed, oriented, 77 year old, white male.  VITAL SIGNS:  Blood pressure 132/76 seated, pulse 64 and regular,  respirations 16.  HEENT:  Normocephalic, PERRL, EOMI.  Oropharynx is clear.  CHEST:  Clear to auscultation with no rhonchi or rales.  No wheezes.  HEART:  Regular rate and rhythm with no murmurs heard.  No gallops or rubs.  ABDOMEN:  Soft, nontender.  Liver and spleen  not felt.  GENITALIA:  Not done, not pertinent to present illness.  RECTAL:  Not done, not pertinent to present illness.  EXTREMITIES:  The patient has painful range of motion of the right knee with  crepitus.  Slight varus deformity noted.  He lacks a few degrees of full  extension.   ADMISSION DIAGNOSES:  1.  Severe osteoarthritis of the right knee.  2.  Hypothyroidism.  3.  Coronary artery disease.  4.  Recurrent Mycobacterium avium.  5.  Diverticulitis.  6.  Chronic prostatitis.   PLAN:  The patient will be admitted for total knee replacement arthroplasty  to the right knee.  The patient has told me that he is to see Dr. Geoffry Paradise for surgical clearance.  Should he have any medical problems when he  is in the hospital will certainly call Dr. Geoffry Paradise and if they  should be of a pulmonary nature, we will call Dr. Sherene Sires to follow along with  [place] during this patient's hospitalization.      [name].    ______________________________  Marlowe Kays, M.D.    DLU/MEDQ  D:  07/06/2005  T:  07/06/2005  Job:  [id number]   cc:   Geoffry Paradise, M.D.  [address]  [place]  [place] [address]  Fax: [phone]   Charlaine Dalton. Sherene Sires, M.D. [facility]  [address]  [place]  [place] [address]

## 2011-04-05 NOTE — Op Note (Signed)
NAME:  [name], [name]                        ACCOUNT NO.:  [id number]   MEDICAL RECORD NO.:  [id number]                   PATIENT TYPE:  AMB   LOCATION:  DAY                                  FACILITY:  [facility]   PHYSICIAN:  Marlowe Kays, M.D.               DATE OF BIRTH:  07-30-1924   DATE OF PROCEDURE:  05/29/2004  DATE OF DISCHARGE:                                 OPERATIVE REPORT   PREOPERATIVE DIAGNOSIS:  Severe right carpal tunnel syndrome.   POSTOPERATIVE DIAGNOSIS:  Severe right carpal tunnel syndrome.   OPERATION:  Decompression of median nerve, right wrist and hand.   SURGEON:  Marlowe Kays, M.D.   ASSISTANT:  Nurse.   ANESTHESIA:  Regional.   JUSTIFICATION FOR PROCEDURE:  He has had pain, significant numbness, and  loss of motor control in his right hand.  Nerve conduction studies  demonstrated severe carpal tunnel syndrome.  He is here today for the above-  mentioned surgery because of this.   PROCEDURE:  Satisfactory IV regional anesthesia, DuraPrep from mid forearm  to fingertips, and then draped as a sterile field.  I marked a curved  incision along the base of thenar eminence, crossing obliquely over the  flexor crease of the wrist in the distal forearm.  The palmaris longus  tendon, which was rudimentary, was retracted to the radial side.  The median  nerve was identified and released into the distal palm.  Skin and  subcutaneous tissue and fascia were part of the decompression.  Individual  neurovascular bundles were dissected out with small hemostat in the mid and  distal palm.  Potential bleeders were coagulated with bipolar cautery.  After irrigating the wound well with sterile saline, the skin and  subcutaneous tissue were then closed with interrupted 4-0 nylon mattress  sutures.  Betadine and Adaptic dry sterile dressing applied.  Volar plaster  splint applied.  Tourniquet was released.   At the time of this dictation, he was on the way to the  recovery room in  satisfactory condition.  There were no known complications.                                               Marlowe Kays, M.D.    JA/MEDQ  D:  05/29/2004  T:  05/29/2004  Job:  [id number]

## 2011-04-05 NOTE — Op Note (Signed)
NAME:  [name], [name]                        ACCOUNT NO.:  [id number]   MEDICAL RECORD NO.:  [id number]                   PATIENT TYPE:  INP   LOCATION:  X002                                 FACILITY:  [facility]   PHYSICIAN:  Marlowe Kays, M.D.               DATE OF BIRTH:  Nov 19, 1923   DATE OF PROCEDURE:  04/23/2004  DATE OF DISCHARGE:                                 OPERATIVE REPORT   PREOPERATIVE DIAGNOSIS:  Severe spinal stenosis L2-3, L3-4, and L4-5 and  foraminal stenosis L5-S1.   POSTOPERATIVE DIAGNOSIS:  Severe spinal stenosis L2-3, L3-4, and L4-5 and  foraminal stenosis L5-S1.   OPERATION:  Central decompressive laminectomy L2 to the sacrum.   SURGEON:  Dr. Fayrene Fearing [place]   ASSISTANT:  Dr. Vira Browns   ANESTHESIA:  General.   PATHOLOGY AND JUSTIFICATION FOR PROCEDURE:  She is having back and bilateral  leg pain.  Myelogram CT scan on Mar 19, 2004, demonstrated a complete block  at L3-4 on the myelogram with severe spinal stenosis at L2-3, particularly  on the left.  Therefore, we had to depend on the CT scan for evaluation of  the spinal canal distal to L3-4, and he was felt to have severe spinal  stenosis at 4-5 and bilateral foraminal stenosis at L5-S1.  Accordingly, he  is here for the above-mentioned surgery.  He had disk abnormalities at every  level, but it was felt that because of the spondylolisthesis, degenerative  changes, and the fact that we are going to do the decompression, no  diskectomies were indicated.   DESCRIPTION OF PROCEDURE:  Satisfactory general anesthesia, prophylactic  antibiotics, prone position on the Wilson frame.  Back was prepped with  DuraPrep, draped in a sterile field.  I made a central incision, finding  what appeared to be L5-S1 and L4-5 and put Kocher clamps on the spinous  processes at these two levels and took a lateral x-ray confirming that these  were the two levels.  An incision was extended superiorly, including the  lamina of L2.  Then with a combination of double-action rongeur and 2, 3,  and 4 mm Kerrison rongeur, began a thorough decompression, working  centrally, removing inferior portion of the neural arch of L2 and superior  portion of the sacrum.  The more major decompression was done without the  aid of the scope and then we brought in the microscope and completed all the  fine detail of the decompression.  All foramina for the L3, L4, L5, and S1  nerve roots were thoroughly decompressed, and the spinal canal was  completely decompressed at the conclusion of the procedure.  Bone wax was  placed around the raw bone.  There was no unusual bleeding at the conclusion  of the case, and there was no dural tear.  I did place a quarter-inch  Penrose drain with safety pin through the left posterior distal flank,  carefully  closed the wound over it under direct visualization with  interrupted #1 Vicryl in the paralumbar muscle and fascia, 2-0 Vicryl in  subcutaneous tissue, and staples in the skin.  Betadine and Adaptic dry  sterile dressing were applied, tolerated the procedure well, and was taken  to the recovery room in satisfactory condition with no known complications.  Estimated blood loss was perhaps 200 mL.  No blood replacement.                                              Marlowe Kays, M.D.   JA/MEDQ  D:  04/23/2004  T:  04/23/2004  Job:  [id number]

## 2011-04-05 NOTE — Discharge Summary (Signed)
[place]. [facility]  Patient:    [name], [name]                       MRN: [id number] Adm. Date:  [id number] Disc. Date: [id number] Attending:  Eleanora Neighbor Dictator:   Jennet Maduro. Earl Gala, R.N., A.N.P. CC:         Lori C. Earl Gala, R.N., A.N.P.   Discharge Summary  PRIMARY DISCHARGE DIAGNOSIS:  Recurrent chest pain with subsequent cardiac catheterization revealing normal circumflex and right coronary arteries with ostial left anterior descending disease of approximately 60%, ejection fraction 75%.  SECONDARY DISCHARGE DIAGNOSES: 1. Atherosclerotic cardiovascular disease. 2. Hypercholesterolemia, currently on Zocor. 3. History of peptic ulcer disease.  HISTORY OF PRESENT ILLNESS:  [name] is a 77 year old white male who has known atherosclerotic cardiovascular disease who presented to our office on June 12, 2000, with an approximate two week history of recurrent chest pain. He was subsequently referred for elective cardiac catheterization.  Please see the dictated history and physical for further patient presentation and profile.  LABORATORY DATA:  PT and PTT were unremarkable.  CBC showed white count of 4.3, hemoglobin 11.6, hematocrit 31.8, MCV was 103.  Chemistries were satisfactory.  HOSPITAL COURSE:  The patient was brought in to the short stay center on June 13, 2000, in order to undergo coronary angiography.  The overall procedure was tolerated well without any known complications.  The findings are as noted above.  It was felt that Mr. Horrigan can continue to be treated medically at this point in time.  There was concern about possible sheath perforation of the superficial femoral artery to the profunda and in light of these findings, Perclose was not able to be performed.  The patient was subsequently admitted for eight hours of bedrest in order to monitor for possible groin complications.  Plans were now made for the patient  to be discharged the following morning if the groin remains stable.  DISCHARGE CONDITION:  Stable.  DISCHARGE MEDICATIONS:  He will resume all of his previous medications as he was taking before coming to the hospital which include: 1. Imdur 120 a day. 2. Cardizem CD 300 mg a day. 3. Synthroid 50 mcg a day. 4. Claritin p.r.n. 5. Aspirin daily. 6. Prevacid 30 mg a day. 7. Lipitor 10 mg a day.  DIET:  He will follow a low fat, low cholesterol diet.  SPECIAL INSTRUCTIONS:  He is to call the office for any problems, otherwise will plan on seeing him back in one week for further evaluation. DD:  06/16/00 TD:  06/17/00 Job: [id number]

## 2011-04-05 NOTE — H&P (Signed)
[place]. [facility]  Patient:    [name], [name]                       MRN: [id number] Adm. Date:  [id number] Disc. Date: [id number] Attending:  Eleanora Neighbor Dictator:   Jennet Maduro. Earl Gala, R.N., A.N.P. CC:         Richard A. Jacky Kindle, M.D.                         History and Physical  CHIEF COMPLAINT:  Recurrent chest pain.  HISTORY OF PRESENT ILLNESS:  The patient is a 77 year old white male who presents to the office on June 12, 2000, as a work-in appointment with our recurrent progressive angina.  He has known atherosclerotic cardiovascular disease and has been treated medically.  He notes that over the past nine to ten days that he has had progressive chest pain that has been occurring with exertion relieved with rest as well as nitroglycerin.  He has had associated shortness of breath as well.  He is now referred for cardiac catheterization.  PAST MEDICAL HISTORY: 1. Atherosclerotic cardiovascular disease with a history of angioplasty to the    first diagonal in October 1991.  He last catheterization was in December    1998 showing moderate LAD disease with normal LV function. 2. Hypercholesterolemia. 3. Peptic ulcer disease. 4. History of nasal allergies. 5. History of pneumonia.  ALLERGIES:  PENICILLIN and SULFA.  He is intolerant to Prilosec which causes diarrhea.  CURRENT MEDICATIONS: 1. Imdur 120 mg a day. 2. Cardizem CD 300 mg a day. 3. Synthroid 50 mcg a day. 4. Claritin p.r.n. 5. Aspirin daily. 6. Prevacid 30 mg a day. 7. Lipitor 10 mg a day.  FAMILY HISTORY:  Father died at 44 and had a history of CHF.  Mother died close to the age of 48 which is of natural causes.  She had had known heart disease.  SOCIAL HISTORY:  He is married with two children.  There is no smoking and no alcohol reported.  REVIEW OF SYSTEMS:  Otherwise as stated above.  PHYSICAL EXAMINATION:  GENERAL:  An elderly white male in no acute  distress.  VITAL SIGNS:  Blood pressure was 120/60 sitting, 130/70 standing, heart rate 64, and respirations 18.  He is 186 pounds.  SKIN:  Warm and dry.  Color is unremarkable.  LUNGS:  Clear.  CARDIOVASCULAR:  Regular rhythm.  ABDOMEN:  Soft.  Positive bowel sounds, nontender, with no masses.  EXTREMITIES:  No evidence of edema.  Distal pulses are intact bilaterally.  NEUROLOGIC:  Intact with no gross focal deficits.  DIAGNOSTIC STUDIES:  PT and PTT are unremarkable.  CBC shows a hemoglobin of 11.6 with a hematocrit of 31.8.  MCV is elevated at 103.2 and white count 4.3. Chemistries are all within normal limits.  OVERALL IMPRESSION:  Recurrent and progressive angina in the setting of known atherosclerotic cardiovascular disease.  PLAN:  We will proceed on with elective cardiac catheterization on June 13, 2000.  The risks, procedure, and benefits have been explained per Dr. Roger Shelter, and he is willing to proceed. DD:  06/13/00 TD:  06/14/00 Job: [id number]

## 2011-04-05 NOTE — Discharge Summary (Signed)
NAME:  [name], [name]                        ACCOUNT NO.:  [id number]   MEDICAL RECORD NO.:  [id number]                   PATIENT TYPE:  INP   LOCATION:  0448                                 FACILITY:  [facility]   PHYSICIAN:  Marlowe Kays, M.D.               DATE OF BIRTH:  11-25-1923   DATE OF ADMISSION:  04/23/2004  DATE OF DISCHARGE:  04/25/2004                                 DISCHARGE SUMMARY   ADMITTING DIAGNOSES:  1. Spinal stenosis, L2 through sacrum.  2. History of bronchitis and pneumonia.  3. Coronary artery disease.  4. Diverticulitis.  5. Hypothyroidism.  6. Gastroesophageal reflux disease.   DISCHARGE DIAGNOSES:  1. Spinal stenosis, L2 through sacrum.  2. History of bronchitis and pneumonia.  3. Coronary artery disease.  4. Diverticulitis.  5. Hypothyroidism.  6. Gastroesophageal reflux disease.   OPERATION:  On April 23, 2004, the patient underwent central decompressive  lumbar laminectomy, L2 to the sacrum.  Dr. Otelia Sergeant assisted.   BRIEF HISTORY:  This is a 77 year old male with continuing problems  concerning his low back.  Examination, as well as myelogram CT scanning,  showed multifactorial spinal stenosis from L2 down to the sacrum.  The  patient has marked increase in pain with his day-to-day activity,  particularly with walking.  He is a very active gentleman, and this was  markedly affecting his day-to-day activity.  It was felt he would benefit  from surgical intervention.  After the complications of surgery were  explained to him, we decided to go with the above procedure.   COURSE IN THE HOSPITAL:  The patient tolerated the surgical procedure quite  well.  He had some mild tingling in his fingertips, which was probably  positional at surgery.  A good amount of draining, which was expected  postoperatively to his back wound.  He was extremely pleased with his  reduction of pain in the lower extremities.  On the second postoperative  day, the drain was  removed, and the dressing was changed by Dr. Simonne Come.  The patient was ambulating in the hall, he was voiding.  It was felt he  could be maintained in his home environment with his family, and he was  discharged home on Percocet and Robaxin.   Neurovascularly remained intact in the lower extremities, and the numbness  in his fingertips had resolved.   LABORATORY DATA:  The laboratory values in the hospital hematologically  showed a preoperative CBC, differential with a very mild normochromic,  normocytic anemia.  Hemoglobin was 12.8, hematocrit was 35.7.  The final  hemoglobin was 11.2, hematocrit of 31.3.  Blood chemistries were essentially  normal, other than a very mild reduction in his sodium at 134.  Urinalysis  was negative for urinary tract infection.  Electrocardiogram showed sinus  bradycardia with left axis deviation.  The chest x-ray showed COPD with  bronchitic and interstitial lung disease changes, and right upper  lobe  scarring.   CONDITION ON DISCHARGE:  Improved, stable.   PLAN:  The patient discharged to his home in the care of his family.   ACTIVITY:  He can walk ad lib.  May shower when all the drainage stops from  the drain portal.  Avoid sitting for long periods of time.   DISCHARGE MEDICATIONS:  1. He was given Percocet 5/325 to use 1-2 q.4-6h. p.r.n. pain.  2. Robaxin 500 mg one q.6h. p.r.n. muscle spasm.  3. Continue his home medications.   DIET:  Continue his home diet.   WOUND CARE:  Change the dressing p.r.n. on the back.   FOLLOW UP:  Return to see Dr. Simonne Come approximately 2 weeks after the date  of surgery.     [name].                 Marlowe Kays, M.D.    DLU/MEDQ  D:  05/10/2004  T:  05/11/2004  Job:  [id number]   cc:   Geoffry Paradise, M.D.  [address]  [place]  [place] [address]  Fax: [phone]   Colleen Can. Deborah Chalk, M.D.  Fax: [phone]

## 2011-04-05 NOTE — Assessment & Plan Note (Signed)
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  He returns as requested for PFTs, denying any  significant dyspnea with ADLs, fevers, chills, sweats, excessive sputum  production, chest pain or leg swelling.   For full inventory of medications please see face sheet, dated October 31, 2006.  Note he is on a complex medical regimen that correlates well  with his present MAR and medication calendar that we generated for him  here, and he has added to in the appropriate columns.   PHYSICAL EXAMINATION:  GENERAL:  He is a pleasant ambulatory white man  in no acute distress.  He is afebrile with normal vital signs.  HEENT:  Unremarkable, pharynx clear.  LUNG FIELDS:  Reveal slightly diminished breath sounds but no wheezing.  HEART:  Regular rhythm without murmur, gallop or rub.  ABDOMEN:  Soft, benign.  EXTREMITIES:  Warm without calf tenderness, cyanosis, clubbing, or  edema.   PFTs are essentially normal, including diffusion capacity.   IMPRESSION:  1. Chronic obstructive pulmonary disease is no longer a tenable      diagnosis in a patient who has normal lung functions.  I believe he      has bronchiectasis with a mild asthmatic component which is well      controlled on low dose of Advair and does not need further      treatment.  2. He does have bronchiectatic pattern as well involving the right      middle lobe and lingula with previous Mycobacterium  avium      intracellulare involvement, with bronchiectatic pattern noted by      previous CT scan dated January 03, 2005.  The Advair is helping as      well with mucociliary function.   The patient has the option to use Levaquin for 7 days for purulent  sputum and Mucinex p.r.n. increasing thickness or severity of cough, but  has not required either one of them.  I did review  the contingency instructions as on previous visits and made arrangements  to see him back in 3 months, sooner if needed.     Charlaine Dalton. Sherene Sires, MD, [facility]  Electronically Signed    MBW/MedQ  DD: 10/31/2006  DT: 10/31/2006  Job #: [id number]   cc:   Geoffry Paradise, M.D.

## 2011-04-05 NOTE — Assessment & Plan Note (Signed)
[facility]                             PULMONARY OFFICE NOTE   NAME:[name] [name] [name] [name]                     MRN:          [id number]  DATE:03/06/2007                            DOB:          1924-10-31    An 77 year old white male with a history of MAI infection, documented  right middle lobe/lingular syndrome without significant chronic airflow  obstruction despite previous smoking history, but maintained on Advair  at low doses, the lowest dose 100/50 b.i.d., for the purpose of  promoting better mucociliary function, and reducing exacerbations of  bronchiectatic-like syndrome.  This has been effective with using  Levaquin 750 daily for up to 7 days, but really has found over the last  6 months, less and less need for this, and also less problems congestive  cough or thick secretions requiring Mucinex.   Overall, he is doing great with no unintended weight loss, fever,  chills, dyspnea, chest pain, or significant increased cough nocturnally,  early morning, or otherwise.   For full inventory of medications, please see face sheet dated March 06, 2007.   PHYSICAL EXAMINATION:  He is a robust, pleasant, ambulatory, white male  who actually appears a bit younger than stated age.  He is afebrile with stable vital signs.  HEENT:  Unremarkable.  Oropharynx clear.  LUNG FIELDS:  Are completely clear bilaterally to auscultation and  percussion with no adventitial breath sounds.  HEART:  Regular rhythm without murmur, gallop, or rub.  No increase in  P2.  ABDOMEN:  Soft and benign.  EXTREMITIES:  Warm without calf tenderness, cyanosis, or clubbing.   Hemoglobin saturation 96% on room air.   Chest x-ray is pending.   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   I am finding that frequently patients, especially elderly, do better  treating exacerbations with short courses of Levaquin than long courses  of treatment directed at the atypicals.  Levaquin, of course, covers  atypicals, but also covers the organisms that cause exacerbations of  bronchiectasis, albeit relatively short high-dose courses.   As long as there is no change in the pattern clinically, I am going to  recommend followup ever 6 months, sooner if needed.     [name] [name]. [name] Sires, MD, [facility]  Electronically Signed    MBW/MedQ  DD: 03/06/2007  DT: 03/07/2007  Job #: [id number]

## 2011-04-29 ENCOUNTER — Encounter: Payer: Self-pay | Admitting: Nurse Practitioner

## 2011-05-01 ENCOUNTER — Encounter: Payer: Self-pay | Admitting: Nurse Practitioner

## 2011-05-01 ENCOUNTER — Other Ambulatory Visit (HOSPITAL_COMMUNITY): Payer: Self-pay | Admitting: Orthopedic Surgery

## 2011-05-01 ENCOUNTER — Ambulatory Visit (INDEPENDENT_AMBULATORY_CARE_PROVIDER_SITE_OTHER): Payer: Medicare Other | Admitting: Nurse Practitioner

## 2011-05-01 VITALS — BP 122/62 | HR 60 | Ht 70.0 in | Wt 182.0 lb

## 2011-05-01 DIAGNOSIS — R609 Edema, unspecified: Secondary | ICD-10-CM | POA: Insufficient documentation

## 2011-05-01 DIAGNOSIS — M546 Pain in thoracic spine: Secondary | ICD-10-CM

## 2011-05-01 DIAGNOSIS — W19XXXA Unspecified fall, initial encounter: Secondary | ICD-10-CM | POA: Insufficient documentation

## 2011-05-01 DIAGNOSIS — I2510 Atherosclerotic heart disease of native coronary artery without angina pectoris: Secondary | ICD-10-CM

## 2011-05-01 NOTE — Assessment & Plan Note (Signed)
I have encouraged him to wear both of his support stockings and try to elevate his legs. I have not changed his medicines.

## 2011-05-01 NOTE — Assessment & Plan Note (Signed)
He says he tripped and did not pass out or get dizzy. He has further evaluation in process. I have encouraged him to use a cane. Hopefully he will be able to get back into his exercise program. If falls continue to be an issue, his coumadin will have to be stopped.

## 2011-05-01 NOTE — Assessment & Plan Note (Signed)
He is not having angina. He will stay on his current medicines. I will have him see Dr. Sanjuana Kava in 6 month for follow up.

## 2011-05-01 NOTE — Progress Notes (Signed)
[name] Date of Birth: 1923/12/06   History of Present Illness: [name] is seen today for his 8 month visit. He is seen for Dr. Deborah Chalk and Dr. Sanjuana Kava. He is doing well from our standpoint. He is not having angina. He is not short of breath. He has had a fall about 6 weeks ago and is slow to recover. He tripped in his bedroom. He hit his right shoulder. He had initial xrays that were negative. His pain has continued in his shoulders and he will be having a bone scan later this week. He had some significant bruising that is now resolved. He is using tylenol and heat with some relief. He will be having his physical next week with Dr. Jacky Kindle. He has had his labs done yesterday for his physical. Apparently there is some abnormality with his labs but he does not know what. He is tolerating his medicines. He is on coumadin. He has had some swelling in the left leg. He only wears support stockings on the right. He has been more inactive due to this fall.   Current Outpatient Prescriptions on File Prior to Visit  Medication Sig Dispense Refill  . aspirin 81 MG tablet Take 81 mg by mouth daily.        . budesonide-formoterol (SYMBICORT) 160-4.5 MCG/ACT inhaler Inhale 2 puffs into the lungs 2 (two) times daily.        . Cholecalciferol (VITAMIN D3) 1000 UNITS CAPS Take 1 capsule by mouth daily.        Marland Kitchen dextromethorphan-guaiFENesin (MUCINEX DM) 30-600 MG per 12 hr tablet Take 1 tablet by mouth every 12 (twelve) hours. As needed       . diltiazem (CARDIZEM CD) 300 MG 24 hr capsule Take 300 mg by mouth daily.        . famotidine (PEPCID) 20 MG tablet Take 20 mg by mouth at bedtime as needed.        . fluticasone (FLONASE) 50 MCG/ACT nasal spray 1 spray by Nasal route daily.        . hyoscyamine (LEVSIN, ANASPAZ) 0.125 MG tablet Take 0.125 mg by mouth every 4 (four) hours as needed.        Marland Kitchen levothyroxine (SYNTHROID, LEVOTHROID) 50 MCG tablet Take 50 mcg by mouth daily.        . Multiple  Vitamins-Minerals (PRESERVISION AREDS PO) Take 1 tablet by mouth daily.        . nitroGLYCERIN (NITROGLYCERIN) 2.5 MG CR capsule Take 2.5 mg by mouth 2 (two) times daily. As needed       . Pseudoeph-CPM-DM-APAP (TYLENOL COLD) 30-2-15-325 MG TABS As directed as needed       . rosuvastatin (CRESTOR) 5 MG tablet Take 1 tablet (5 mg total) by mouth daily.  90 tablet  0  . sertraline (ZOLOFT) 50 MG tablet Take 100 mg by mouth daily.       Marland Kitchen warfarin (COUMADIN) 1 MG tablet Take 1 mg by mouth as directed.        Marland Kitchen DISCONTD: omeprazole (PRILOSEC OTC) 20 MG tablet Take 20 mg by mouth daily. 30 min before breakfast         Allergies  Allergen Reactions  . Penicillins   . Sulfonamide Derivatives     Past Medical History  Diagnosis Date  . Allergic rhinitis   . 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 COPD (chronic obstructive pulmonary disease)   . Diverticula of colon   . Right  leg DVT 01/2010  . IHD (ischemic heart disease)   . Hyperlipidemia   . Hypertension   . Depression   . Myalgia   . Hypothyroidism   . Bronchiectasis   . Spinal stenosis   . History of heartburn   . Joint pain     Past Surgical History  Procedure Date  . Cardiac catheterization 06/13/2000    normal. ef 60%  . Cardiovascular stress test 04/2010    ef 54%, and normal perfusion  . Coronary angioplasty 1991    1st diagonal    History  Smoking status  . Former Smoker -- 0.5 packs/day for 50 years  . Types: Cigarettes  . Quit date: 11/18/1952  Smokeless tobacco  . Not on file    History  Alcohol Use No    Family History  Problem Relation Age of Onset  . Heart disease Father   . Heart failure Father     Review of Systems: The review of systems is positive for recent fall with residual pain. Further evaluation is in progress. No angina. No shortness of breath. No syncope or dizziness.  All other systems were reviewed and are negative.  Physical Exam: BP 122/62  Pulse 60  Ht 5\' 10"   (1.778 m)  Wt 182 lb (82.555 kg)  BMI 26.11 kg/m2 Patient is very pleasant and in no acute distress. He is elderly. He is a little kyphotic.  Skin is warm and dry. Color is normal.  HEENT is unremarkable. Normocephalic/atraumatic. PERRL. Sclera are nonicteric. Neck is supple. No masses. No JVD. Lungs are clear. Cardiac exam shows a regular rate and rhythm. Abdomen is soft. Extremities show trace edema on the left. He has a support stocking on the right. Gait and ROM are intact. No gross neurologic deficits noted.  LABORATORY DATA: N/A   Assessment / Plan:

## 2011-05-01 NOTE — Patient Instructions (Signed)
Wear both of your support stockings. This will help with your swelling I will have you see Dr. Sanjuana Kava in 6 months. Call for any problems.

## 2011-05-03 ENCOUNTER — Ambulatory Visit: Payer: Medicare Other | Attending: Ophthalmology | Admitting: Occupational Therapy

## 2011-05-03 DIAGNOSIS — IMO0001 Reserved for inherently not codable concepts without codable children: Secondary | ICD-10-CM | POA: Insufficient documentation

## 2011-05-03 DIAGNOSIS — H53419 Scotoma involving central area, unspecified eye: Secondary | ICD-10-CM | POA: Insufficient documentation

## 2011-05-03 DIAGNOSIS — H3530 Unspecified macular degeneration: Secondary | ICD-10-CM | POA: Insufficient documentation

## 2011-05-09 ENCOUNTER — Ambulatory Visit (HOSPITAL_COMMUNITY): Admission: RE | Admit: 2011-05-09 | Payer: Medicare Other | Source: Ambulatory Visit

## 2011-05-09 ENCOUNTER — Ambulatory Visit (HOSPITAL_COMMUNITY)
Admission: RE | Admit: 2011-05-09 | Discharge: 2011-05-09 | Disposition: A | Discharge status: Home / Self Care | Payer: Medicare Other | Source: Ambulatory Visit | Attending: Orthopedic Surgery | Admitting: Orthopedic Surgery

## 2011-05-09 ENCOUNTER — Encounter (HOSPITAL_COMMUNITY): Payer: Self-pay

## 2011-05-09 DIAGNOSIS — Z859 Personal history of malignant neoplasm, unspecified: Secondary | ICD-10-CM | POA: Insufficient documentation

## 2011-05-09 DIAGNOSIS — M546 Pain in thoracic spine: Secondary | ICD-10-CM

## 2011-05-09 MED ORDER — TECHNETIUM TC 99M MEDRONATE IV KIT
22.6000 | PACK | Freq: Once | INTRAVENOUS | Status: AC | PRN
  Administered 2011-05-09: 22.6 via INTRAVENOUS

## 2011-05-15 ENCOUNTER — Encounter: Payer: Self-pay | Admitting: Adult Health

## 2011-05-16 ENCOUNTER — Ambulatory Visit (INDEPENDENT_AMBULATORY_CARE_PROVIDER_SITE_OTHER): Payer: Medicare Other | Admitting: Adult Health

## 2011-05-16 ENCOUNTER — Encounter: Payer: Self-pay | Admitting: Adult Health

## 2011-05-16 DIAGNOSIS — R05 Cough: Secondary | ICD-10-CM

## 2011-05-16 DIAGNOSIS — J449 Chronic obstructive pulmonary disease, unspecified: Secondary | ICD-10-CM

## 2011-05-16 NOTE — Progress Notes (Signed)
Subjective:    Patient ID: [name], male    DOB: 06-Mar-1924, 77 y.o.   MRN: [id number]  HPI 5 yowm quit smoking in 1953 with minimal airflow obstruction by PFT's 12/07 and evidence of MAI infection by previous FOB (see PMHX)   September 08, 2009 Acute visit. Pt c/o prod cough with yellow sputum x 1 wk. Pt states that his breathing has been okay but he does state that lying flat on his back can make him SOB. better on mucinex. Wife reports more hoarseness x 1 year ? advair related. symbicort 160 two times a day   January 17, 2010 Acute visit. Pt c/o dry cough x 2 wks. He states that his chest feels sore from coughing. Mucindex dm not helping. mild dysphagia. no sob > baseline so no change rx  September 25, 2010 ov cc Cough- the same, not using any contingency meds on the list despite purulent sputum. reviewed med cal and prns and used the ruby slipper analogy > cough improved   January 02, 2011 cc cough each am x months does not disturb sleep, minimal yellow no blood. not following instructions for contingency meds at bottom of calendar cxr nochange,  so rec follow calendar but no change in rx x take asa with bfast, not hs  02/13/2011 ov cc minimal change in chronic mostly dry cough, hoarsenss, sob. Did not follow instructions as per calendar, still taking asa at hs. >>Symbicort decreased 1 puff Twice daily    05/16/11 Follow up and med review We reviewed all his meds and organized them into a med calendar. He has had a few changes recently.  He continues to be about the same with low energy. Cough is minimal . Main concern from wife is that he remains hoarse.  This has persisted for last couple of years. He denies overt reflux or drainage. In past changed from Advair to Symbicort , and  Symbicort was decreased to 1 puff Twice daily . Has been put on prilosec in past but unclear if he took or not . Does take Pepcid At bedtime  .    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  HEENT:   No headaches,  Difficulty swallowing,  Tooth/dental problems, or  Sore throat,                No sneezing, itching, ear ache, nasal congestion, post nasal drip,   CV:  No chest pain,  Orthopnea, PND, swelling in lower extremities, anasarca, dizziness, palpitations, syncope.   GI  No heartburn, indigestion, abdominal pain, nausea, vomiting, diarrhea, change in bowel habits, loss of appetite, bloody stools.   Resp:   No coughing up of blood.  No change in color of mucus.  No wheezing.  No chest wall deformity  Skin: no rash or lesions.  GU: no dysuria, change in color of urine, no urgency or frequency.  No flank pain, no hematuria   MS:  No joint pain or swelling.  No decreased range of motion.    Psych:  No change in mood or affect. No depression or anxiety.           Objective:   Physical Exam  wt 186 November 22, 2008 > 182 September 25, 2010 > 180 January 02, 2011 >  184 02/13/2011 >184 05/16/2011  amb chronically ill wm nad  HEENT mild turbinate edema. Oropharynx no thrush or excess pnd or cobblestoning. No JVD or cervical adenopathy. Mild accessory muscle hypertrophy. Trachea midline, nl thryroid. Chest was hyperinflated by percussion with diminished breath sounds and moderate increased exp time without wheeze. Hoover sign positive at mid inspiration. Regular rate and rhythm without murmur gallop or rub or increase P2 no edema.  Abd: no hsm, nl excursion. Ext warm without calf tenderness or clubbing.         Assessment & Plan:

## 2011-05-16 NOTE — Assessment & Plan Note (Addendum)
Chronic cough -now controlled on current regimen Patient's medications were reviewed today and patient education was given. Computerized medication calendar was adjusted/completed He does have persistent associated hoarseness- will need to treat for possible triggers If not improving will need to refer to ENT   Plan:  Begin Prilosec 20mg  daily -take before meal  GERD diet  NO MINTS  Add Zyrtec 10mg  At bedtime   follow up 6 weeks and As needed   Please contact office for sooner follow up if symptoms do not improve or worsen or seek emergency care  Follow med calendar closely and bring to each visit.

## 2011-05-16 NOTE — Patient Instructions (Signed)
Begin Prilosec 20mg  daily -take before meal  GERD diet  NO MINTS  Add Zyrtec 10mg  At bedtime   follow up 6 weeks and As needed   Please contact office for sooner follow up if symptoms do not improve or worsen or seek emergency care  Follow med calendar closely and bring to each visit.

## 2011-05-16 NOTE — Assessment & Plan Note (Signed)
Controlled on rx   

## 2011-05-20 ENCOUNTER — Encounter: Payer: Self-pay | Admitting: Adult Health

## 2011-06-03 ENCOUNTER — Other Ambulatory Visit: Payer: Self-pay | Admitting: Cardiology

## 2011-06-03 NOTE — Telephone Encounter (Signed)
Fax received from pharmacy. Refill completed. Jodette Novalie Leamy RN  

## 2011-06-27 ENCOUNTER — Ambulatory Visit (INDEPENDENT_AMBULATORY_CARE_PROVIDER_SITE_OTHER)
Admission: RE | Admit: 2011-06-27 | Discharge: 2011-06-27 | Disposition: A | Discharge status: Home / Self Care | Payer: Medicare Other | Source: Ambulatory Visit | Attending: Internal Medicine | Admitting: Internal Medicine

## 2011-06-27 ENCOUNTER — Encounter: Payer: Self-pay | Admitting: Internal Medicine

## 2011-06-27 ENCOUNTER — Ambulatory Visit (INDEPENDENT_AMBULATORY_CARE_PROVIDER_SITE_OTHER): Payer: Medicare Other | Admitting: Internal Medicine

## 2011-06-27 VITALS — BP 122/58 | HR 56 | Temp 98.0°F | Ht 69.0 in | Wt 182.0 lb

## 2011-06-27 DIAGNOSIS — J479 Bronchiectasis, uncomplicated: Secondary | ICD-10-CM

## 2011-06-27 DIAGNOSIS — J449 Chronic obstructive pulmonary disease, unspecified: Secondary | ICD-10-CM

## 2011-06-27 NOTE — Patient Instructions (Addendum)
Stop taking the symbicort perfectly regularly and just use it as needed when short of breath up 2 puffs every 12 hours with spacer as instructed  Return to pulmonary clinic for any unexplained cough or shortness of breath.

## 2011-06-27 NOTE — Assessment & Plan Note (Signed)
DDX of  difficult airways managment all start with A and  include Adherence, Ace Inhibitors, Acid Reflux, Active Sinus Disease, Alpha 1 Antitripsin deficiency, Anxiety masquerading as Airways dz,  ABPA,  allergy(esp in young), Aspiration (esp in elderly), Adverse effects of DPI,  Active smokers, plus two Bs  = Bronchiectasis and Beta blocker use..and one C= CHF  Adherence is always the initial "prime suspect" and is a multilayered concern that requires a "trust but verify" approach in every patient - starting with knowing how to use medications, especially inhalers, correctly, keeping up with refills and understanding the fundamental difference between maintenance and prns vs those medications only taken for a very short course and then stopped and not refilled. The proper method of use, as well as anticipated side effects, of this metered-dose inhaler are discussed and demonstrated to the patient. Improved to 50% with spacer added

## 2011-06-27 NOTE — Progress Notes (Signed)
Subjective:    Patient ID: [name], male    DOB: 10-Jul-1924, 77 y.o.   MRN: [id number]  HPI 25 yowm quit smoking in 1953 with minimal airflow obstruction by PFT's 12/07 and evidence of MAI infection by previous FOB (see PMHX)   September 08, 2009 Acute visit. Pt c/o prod cough with yellow sputum x 1 wk. Pt states that his breathing has been okay but he does state that lying flat on his back can make him SOB. better on mucinex. Wife reports more hoarseness x 1 year ? advair related. symbicort 160 two times a day   January 17, 2010 Acute visit. Pt c/o dry cough x 2 wks. He states that his chest feels sore from coughing. Mucindex dm not helping. mild dysphagia. no sob > baseline so no change rx  September 25, 2010 ov cc Cough- the same, not using any contingency meds on the list despite purulent sputum. reviewed med cal and prns and used the ruby slipper analogy > cough improved   January 02, 2011 cc cough each am x months does not disturb sleep, minimal yellow no blood. not following instructions for contingency meds at bottom of calendar cxr nochange,  so rec follow calendar but no change in rx x take asa with bfast, not hs  02/13/2011 ov cc minimal change in chronic mostly dry cough, hoarsenss, sob. Did not follow instructions as per calendar, still taking asa at hs. >>Symbicort decreased 1 puff Twice daily    05/16/11 Follow up and med review We reviewed all his meds and organized them into a med calendar. He has had a few changes recently.  He continues to be about the same with low energy. Cough is minimal . Main concern from wife is that he remains hoarse.  This has persisted for last couple of years. He denies overt reflux or drainage. In past changed from Advair to Symbicort , and  Symbicort was decreased to 1 puff Twice daily . Has been put on prilosec in past but unclear if he took or not . Does take Pepcid At bedtime  . rec  Begin Prilosec 20mg  daily -take before meal  GERD diet  NO  MINTS  Add Zyrtec 10mg  At bedtime  06/27/2011 f/u ov/Wert cc zyrtec caused diarrhea so stopped and resolved.  No purulent sputum or sob  Pt denies any significant sore throat, dysphagia, itching, sneezing,  nasal congestion or excess/ purulent secretions,  fever, chills, sweats, unintended wt loss, pleuritic or exertional cp, hempoptysis, orthopnea pnd or leg swelling.    Also denies any obvious fluctuation of symptoms with weather or environmental changes or other aggravating or alleviating factors.     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)  - FOB 3/98  - Rx 3/06 > 11/06  COPD (ICD-496)  - PFT's with minimal changes 10/2006 - HFA 50% January 17, 2010 > 50% 06/27/2011 with spacer added for hoarseness Diverticulosis.......................................................................................[facility]  Health Maintenance.............................................................................Marland KitchenJacky Kindle  - Pneumovax 2/04 second shot  Right DVT  - 01/2010 >>coumadin         Review of Systems Constitutional:   No  weight loss, night sweats,  Fevers, chills, fatigue, or  lassitude.  HEENT:   No headaches,  Difficulty swallowing,  Tooth/dental problems, or  Sore throat,                No sneezing, itching, ear ache, nasal congestion, post nasal drip,   CV:  No chest pain,  Orthopnea, PND, swelling in lower extremities, anasarca, dizziness, palpitations, syncope.   GI  No heartburn, indigestion, abdominal pain, nausea, vomiting, diarrhea, change in bowel habits, loss of appetite, bloody stools.   Resp:   No coughing up of blood.  No change in color of mucus.  No wheezing.  No chest wall deformity  Skin: no rash or lesions.  GU: no dysuria, change in color of urine, no urgency or frequency.  No flank pain, no hematuria   MS:  No joint pain or swelling.  No decreased range of motion.    Psych:  No change in mood or affect.  No depression or anxiety.           Objective:   Physical Exam  wt 186 November 22, 2008 > 182 September 25, 2010 >   184 02/13/2011 >184 05/16/2011 > 182 06/27/2011  amb chronically ill wm nad walks stooped over HEENT mild turbinate edema. Oropharynx no thrush or excess pnd or cobblestoning. No JVD or cervical adenopathy. Mild accessory muscle hypertrophy. Trachea midline, nl thryroid. Chest was hyperinflated by percussion with diminished breath sounds and moderate increased exp time without wheeze. Hoover sign positive at mid inspiration. Regular rate and rhythm without murmur gallop or rub or increase P2 no edema. Abd: no hsm, nl excursion. Ext warm without calf tenderness or clubbing.       cxr 06/27/2011 Stable COPD and chronic bilateral opacities.     Assessment & Plan:

## 2011-06-27 NOTE — Assessment & Plan Note (Signed)
cxr stable x sev years without tendency to purulent tracheobronchitis nor hemoptysis  He is progressing rapidly downhill however in terms of geriatric decline and nothing else to offer here  In fact,  Clearly the high likelihood of prolonging suffering from pulmonary interventions vastly outweighs any reasonable chance of benefit from offering anything else because medical science has done all it can here to restore health.  Therefore  I don't have any additional recs  except to consider  Addressing end of life issues at some point, but defer this to Dr Lanell Matar capable hands.

## 2011-06-27 NOTE — Progress Notes (Signed)
Quick Note:  Spoke with pt and notified of results per Dr. Wert. Pt verbalized understanding and denied any questions.  ______ 

## 2011-08-27 LAB — CBC
HCT: 36.3 — ABNORMAL LOW
MCHC: 34.2
MCV: 101.7 — ABNORMAL HIGH
Platelets: 190
RDW: 14.3

## 2011-08-27 LAB — URINALYSIS, ROUTINE W REFLEX MICROSCOPIC
Nitrite: NEGATIVE
Protein, ur: NEGATIVE
Specific Gravity, Urine: 1.006
Urobilinogen, UA: 0.2

## 2011-08-27 LAB — ABO/RH: ABO/RH(D): A POS

## 2011-08-27 LAB — COMPREHENSIVE METABOLIC PANEL
ALT: 21
AST: 27
Albumin: 3.7
Alkaline Phosphatase: 46
GFR calc Af Amer: >60
Glucose, Bld: 72
Potassium: 4.3
Sodium: 138
Total Protein: 7.5

## 2011-08-27 LAB — DIFFERENTIAL
Basophils Relative: 0
Eosinophils Absolute: 0.1 — ABNORMAL LOW
Eosinophils Relative: 1
Monocytes Absolute: 0.7
Monocytes Relative: 12
Neutrophils Relative %: 70

## 2011-08-27 LAB — TYPE AND SCREEN: Antibody Screen: NEGATIVE

## 2011-09-12 ENCOUNTER — Encounter: Payer: Self-pay | Admitting: Hematology and Oncology

## 2011-09-26 ENCOUNTER — Encounter: Payer: Self-pay | Admitting: *Deleted

## 2011-10-01 ENCOUNTER — Other Ambulatory Visit (HOSPITAL_BASED_OUTPATIENT_CLINIC_OR_DEPARTMENT_OTHER): Payer: Medicare Other

## 2011-10-01 ENCOUNTER — Other Ambulatory Visit: Payer: Self-pay | Admitting: Hematology and Oncology

## 2011-10-01 ENCOUNTER — Other Ambulatory Visit: Payer: Self-pay | Admitting: Oncology

## 2011-10-01 ENCOUNTER — Ambulatory Visit (HOSPITAL_COMMUNITY)
Admission: RE | Admit: 2011-10-01 | Discharge: 2011-10-01 | Disposition: A | Discharge status: Home / Self Care | Payer: Medicare Other | Source: Ambulatory Visit | Attending: Oncology | Admitting: Oncology

## 2011-10-01 DIAGNOSIS — R0602 Shortness of breath: Secondary | ICD-10-CM | POA: Insufficient documentation

## 2011-10-01 DIAGNOSIS — D539 Nutritional anemia, unspecified: Secondary | ICD-10-CM

## 2011-10-01 DIAGNOSIS — J4489 Other specified chronic obstructive pulmonary disease: Secondary | ICD-10-CM | POA: Insufficient documentation

## 2011-10-01 DIAGNOSIS — C801 Malignant (primary) neoplasm, unspecified: Secondary | ICD-10-CM

## 2011-10-01 DIAGNOSIS — J984 Other disorders of lung: Secondary | ICD-10-CM | POA: Insufficient documentation

## 2011-10-01 DIAGNOSIS — J449 Chronic obstructive pulmonary disease, unspecified: Secondary | ICD-10-CM | POA: Insufficient documentation

## 2011-10-01 DIAGNOSIS — Z85828 Personal history of other malignant neoplasm of skin: Secondary | ICD-10-CM

## 2011-10-01 LAB — CBC WITH DIFFERENTIAL/PLATELET
BASO%: 1.6 % (ref 0.0–2.0)
Eosinophils Absolute: 0.1 10*3/uL (ref 0.0–0.5)
LYMPH%: 24 % (ref 14.0–49.0)
MCHC: 34.3 g/dL (ref 32.0–36.0)
MCV: 100.9 fL — ABNORMAL HIGH (ref 79.3–98.0)
MONO#: 0.7 10*3/uL (ref 0.1–0.9)
MONO%: 10.9 % (ref 0.0–14.0)
NEUT#: 4 10*3/uL (ref 1.5–6.5)
RBC: 3.11 10*6/uL — ABNORMAL LOW (ref 4.20–5.82)
RDW: 15.2 % — ABNORMAL HIGH (ref 11.0–14.6)
WBC: 6.4 10*3/uL (ref 4.0–10.3)

## 2011-10-01 LAB — COMPREHENSIVE METABOLIC PANEL
ALT: 10 U/L (ref 0–53)
Albumin: 3.9 g/dL (ref 3.5–5.2)
Alkaline Phosphatase: 54 U/L (ref 39–117)
Glucose, Bld: 119 mg/dL — ABNORMAL HIGH (ref 70–99)
Potassium: 3.6 mEq/L (ref 3.5–5.3)
Sodium: 134 mEq/L — ABNORMAL LOW (ref 135–145)
Total Bilirubin: 0.4 mg/dL (ref 0.3–1.2)
Total Protein: 6.8 g/dL (ref 6.0–8.3)

## 2011-10-04 ENCOUNTER — Ambulatory Visit: Payer: Medicare Other

## 2011-10-04 ENCOUNTER — Ambulatory Visit (HOSPITAL_BASED_OUTPATIENT_CLINIC_OR_DEPARTMENT_OTHER): Payer: Medicare Other | Admitting: Hematology and Oncology

## 2011-10-04 ENCOUNTER — Other Ambulatory Visit: Payer: Self-pay | Admitting: Hematology and Oncology

## 2011-10-04 VITALS — BP 128/63 | HR 60 | Temp 97.1°F | Ht 69.0 in | Wt 177.2 lb

## 2011-10-04 DIAGNOSIS — C792 Secondary malignant neoplasm of skin: Secondary | ICD-10-CM | POA: Insufficient documentation

## 2011-10-04 DIAGNOSIS — D649 Anemia, unspecified: Secondary | ICD-10-CM

## 2011-10-04 DIAGNOSIS — D539 Nutritional anemia, unspecified: Secondary | ICD-10-CM

## 2011-10-04 DIAGNOSIS — C4492 Squamous cell carcinoma of skin, unspecified: Secondary | ICD-10-CM

## 2011-10-04 LAB — CBC WITH DIFFERENTIAL/PLATELET
Basophils Absolute: 0 10*3/uL (ref 0.0–0.1)
EOS%: 1.1 % (ref 0.0–7.0)
Eosinophils Absolute: 0.1 10*3/uL (ref 0.0–0.5)
HCT: 33.2 % — ABNORMAL LOW (ref 38.4–49.9)
HGB: 11.2 g/dL — ABNORMAL LOW (ref 13.0–17.1)
MONO#: 0.7 10*3/uL (ref 0.1–0.9)
NEUT#: 4.2 10*3/uL (ref 1.5–6.5)
NEUT%: 64.5 % (ref 39.0–75.0)
RDW: 15.3 % — ABNORMAL HIGH (ref 11.0–14.6)
WBC: 6.5 10*3/uL (ref 4.0–10.3)
lymph#: 1.5 10*3/uL (ref 0.9–3.3)

## 2011-10-04 NOTE — Progress Notes (Signed)
This office note has been dictated.

## 2011-10-04 NOTE — Progress Notes (Signed)
CC:   Geoffry Paradise, M.D. Charlaine Dalton. Sherene Sires, MD, FCCP Daniel B. Yetta Barre, M.D.  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  INTERIM HISTORY:  Mr. Bures is here with his wife for routine visit. He reports that he has had issues with wheezing. He has also close follow up with Dr. Yetta Barre and reporting skin checks every 6 months.  He has had no major concerns to this regard.  He denies fever, chills, or night sweats.  He denies adenopathy.  Reviewed his recent chest x-ray performed on 10/01/2011.  This showed stable chronic bilateral pleural parenchymal scarring with no acute findings.  Heart size is stable.  MEDICATIONS:  Reviewed and updated.  PHYSICAL EXAMINATION:  General: Elderly man, but in no current distress. Vitals: Pulse 60, blood pressure 128/63, temperature 97.1, respirations 18, and weight 177 pounds.  HEENT: Head is atraumatic, normocephalic. Sclerae anicteric.  Mouth moist.  Chest:  Clear.  CVS is unremarkable. Abdomen:  Soft, nontender.  Bowel sounds present.  Extremities: Trace edema.  LABORATORY DATA:  10/01/2011: White cell count 6.4, hemoglobin 10.8, hematocrit 31.4, platelets 201, and MCV 100. Sodium 134, potassium 2.6, chloride 101, CO2 24, BUN 17, creatinine 1.15, glucose 109, total bilirubin 0.4, alkaline phosphatase 54, AST 20, ALT 10, calcium 8.9, and LDH 186.  IMAGING DATA:  Chest x-ray, as above.  IMPRESSION AND PLAN:  Mr. Easom is an 77 year old man with metastatic squamous cell carcinoma of the skin to the left axillary lymph nodes.  He is status post left axillary lymph node dissection.  He appears to have no evidence of metastatic disease at this time with a stable chest x-ray.  Thus, from my standpoint, he follows up within a year's time or a p.r.n. Basis. I do note anemia with a hemoglobin of 10.8.  So with this said,  I will have the lab add anemia panel to his lab work.  I will be telephoning him with results.    ______________________________ Laurice Record, M.D. LIO/MEDQ  D:  10/04/2011  T:  10/04/2011  Job:  [id number]

## 2011-10-05 LAB — FERRITIN: Ferritin: 38 ng/mL (ref 22–322)

## 2011-10-05 LAB — IRON AND TIBC
%SAT: 26 % (ref 20–55)
TIBC: 303 ug/dL (ref 215–435)

## 2011-10-09 ENCOUNTER — Other Ambulatory Visit: Payer: Self-pay | Admitting: Neurology

## 2011-10-09 ENCOUNTER — Telehealth: Payer: Self-pay | Admitting: *Deleted

## 2011-10-09 DIAGNOSIS — R413 Other amnesia: Secondary | ICD-10-CM

## 2011-10-09 DIAGNOSIS — G250 Essential tremor: Secondary | ICD-10-CM

## 2011-10-09 NOTE — Telephone Encounter (Signed)
Spoke with pt today and informed pt re:   Iron studies results  Ok  As per md.

## 2011-10-15 ENCOUNTER — Ambulatory Visit
Admission: RE | Admit: 2011-10-15 | Discharge: 2011-10-15 | Disposition: A | Discharge status: Home / Self Care | Payer: Medicare Other | Source: Ambulatory Visit | Attending: Neurology | Admitting: Neurology

## 2011-10-15 DIAGNOSIS — R413 Other amnesia: Secondary | ICD-10-CM

## 2011-10-15 DIAGNOSIS — G252 Other specified forms of tremor: Secondary | ICD-10-CM

## 2011-11-06 ENCOUNTER — Ambulatory Visit (INDEPENDENT_AMBULATORY_CARE_PROVIDER_SITE_OTHER): Payer: Medicare Other | Admitting: Cardiovascular Disease

## 2011-11-06 ENCOUNTER — Encounter: Payer: Self-pay | Admitting: Cardiovascular Disease

## 2011-11-06 VITALS — BP 138/68 | HR 59 | Ht 69.0 in | Wt 179.0 lb

## 2011-11-06 DIAGNOSIS — I10 Essential (primary) hypertension: Secondary | ICD-10-CM

## 2011-11-06 DIAGNOSIS — I2510 Atherosclerotic heart disease of native coronary artery without angina pectoris: Secondary | ICD-10-CM

## 2011-11-06 NOTE — Patient Instructions (Signed)
Your physician wants you to follow-up in: 6 months  You will receive a reminder letter in the mail two months in advance. If you don't receive a letter, please call our office to schedule the follow-up appointment.  Your physician recommends that you continue on your current medications as directed. Please refer to the Current Medication list given to you today.  

## 2011-11-06 NOTE — Assessment & Plan Note (Signed)
BP well controlled.

## 2011-11-06 NOTE — Assessment & Plan Note (Signed)
Stable. No changes today. He is feeling well.

## 2011-11-06 NOTE — Progress Notes (Signed)
History of Present Illness: 77 yo WM with history of CAD, COPD, DVT, HTN, HLD here today for cardiac follow up. He has been followed in the past by Dr Deborah Chalk and was last seen in this office by Norma Fredrickson, NP in June 2012.  Cardiac cath in July 2001 with 60% ostial LAD stenosis, minor disease in the RCA. He has a history of right LE DVT in 2010. He is now off of coumadin per Dr. Jacky Kindle because of his fall risk.   He tells me that he feels well. He denies any chest pain or pressure. No SOB, palpitations. No dizziness, near syncope or syncope.   His primary care is Dr. Jacky Kindle. Lipids are followed in primary care.   Past Medical History  Diagnosis Date  . Allergic rhinitis   . 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 COPD (chronic obstructive pulmonary disease)   . Diverticula of colon   . Right leg DVT 01/2010  . IHD (ischemic heart disease)   . Hyperlipidemia   . Hypertension   . Depression   . Myalgia   . Hypothyroidism   . Bronchiectasis   . Spinal stenosis   . History of heartburn   . Joint pain   . 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 Coronary artery disease     Past Surgical History  Procedure Date  . Cardiac catheterization 06/13/2000    normal. ef 60%  . Cardiovascular stress test 04/2010    ef 54%, and normal perfusion  . Coronary angioplasty 1991    1st diagonal  . Carpal tunnel release     right  . Rotator cuff repair     left shoulder  . Knee surgery August  2006    S/P right knee replacement  . Laminectomy     2005  . Cataract extraction     Bilateral     Current Outpatient Prescriptions  Medication Sig Dispense Refill  . acetaminophen (TYLENOL) 500 MG tablet As directed       . aspirin 81 MG tablet Take 325 mg by mouth at bedtime.       . budesonide-formoterol (SYMBICORT) 160-4.5 MCG/ACT inhaler Inhale 2 puffs into the lungs 2 (two) times daily.        Marland Kitchen dextromethorphan-guaiFENesin (MUCINEX DM) 30-600 MG per  12 hr tablet Take 1 tablet by mouth every 12 (twelve) hours. As needed       . diltiazem (CARDIZEM CD) 300 MG 24 hr capsule Take 300 mg by mouth daily.        . famotidine (PEPCID) 20 MG tablet Take 20 mg by mouth at bedtime as needed.        . fluticasone (FLONASE) 50 MCG/ACT nasal spray 1 spray by Nasal route daily.        Marland Kitchen levofloxacin (LEVAQUIN) 750 MG tablet Take 750 mg by mouth daily.        Marland Kitchen levothyroxine (SYNTHROID, LEVOTHROID) 50 MCG tablet Take 50 mcg by mouth daily.        . multivitamin-lutein (OCUVITE-LUTEIN) CAPS Take 1 capsule by mouth daily.        . nitroGLYCERIN (NITROGLYCERIN) 2.5 MG CR capsule Take 2.5 mg by mouth 2 (two) times daily. As needed       . sertraline (ZOLOFT) 50 MG tablet Take by mouth daily.         Allergies  Allergen Reactions  . Penicillins   . Sulfonamide Derivatives  History   Social History  . Marital Status: Married    Spouse Name: N/A    Number of Children: N/A  . Years of Education: N/A   Occupational History  . Not on file.   Social History Main Topics  . Smoking status: Former Smoker -- 0.5 packs/day for 50 years    Types: Cigarettes    Quit date: 11/18/1952  . Smokeless tobacco: Never Used  . Alcohol Use: No  . Drug Use: No  . Sexually Active: Not on file   Other Topics Concern  . Not on file   Social History Narrative  . No narrative on file    Family History  Problem Relation Age of Onset  . Heart disease Father   . Heart failure Father   . Breast cancer Maternal Aunt     Review of Systems:  As stated in the HPI and otherwise negative.   BP 138/68  Pulse 59  Ht 5\' 9"  (1.753 m)  Wt 179 lb (81.194 kg)  BMI 26.43 kg/m2  Physical Examination: General: Well developed, well nourished, NAD HEENT: OP clear, mucus membranes moist SKIN: warm, dry. No rashes. Neuro: No focal deficits Musculoskeletal: Muscle strength 5/5 all ext Psychiatric: Mood and affect normal Neck: No JVD, no carotid bruits, no  thyromegaly, no lymphadenopathy. Lungs:Clear bilaterally, no wheezes, rhonci, crackles Cardiovascular: Regular rate and rhythm. No murmurs, gallops or rubs. Abdomen:Soft. Bowel sounds present. Non-tender.  Extremities: No lower extremity edema. Pulses are 2 + in the bilateral DP/PT.  EKG: Sinus bradycardia, rate 59 bpm. LVH. IVCD.   Cardiac Cath July 2001:  1. Left main coronary artery:  Left main coronary artery is normal. 2. Left circumflex:  The left circumflex is a large vessel.  It has a high    obtuse marginal vessel without significant disease.  The large left    circumflex continues and has three posterolateral branches. 3. Left anterior descending:  The left anterior descending has an ostial    disease of approximately 60% nature involving the first 1 to 2 cm of the    left anterior descending.  The left anterior descending is approximately    2.5 mm in diameter and extends to and around the apex.  There is one    proximal diagonal vessel that has mild narrowing in the ostium and was the    site of a previous angioplasty but is not severely stenotic. 4. Right coronary artery:  The right coronary artery is a small dominant    vessel.  It has mild irregularities in the midportion.  No significant and    focal disease.  There is a moderate sized acute marginal vessel.

## 2011-12-23 ENCOUNTER — Telehealth: Payer: Self-pay | Admitting: Internal Medicine

## 2011-12-23 MED ORDER — BUDESONIDE-FORMOTEROL FUMARATE 160-4.5 MCG/ACT IN AERO: 2.0000 | INHALATION_SPRAY | Freq: Two times a day (BID) | RESPIRATORY_TRACT | Status: DC

## 2011-12-23 NOTE — Telephone Encounter (Signed)
I spoke with pt and needed rx for symbicort sent to [facility]. I have sent rx and nothing further was needed

## 2012-02-13 ENCOUNTER — Other Ambulatory Visit: Payer: Self-pay | Admitting: Cardiology

## 2012-02-25 ENCOUNTER — Other Ambulatory Visit: Payer: Self-pay | Admitting: Dermatology

## 2012-04-08 ENCOUNTER — Other Ambulatory Visit: Payer: Self-pay | Admitting: Internal Medicine

## 2012-04-14 ENCOUNTER — Telehealth: Payer: Self-pay | Admitting: Internal Medicine

## 2012-04-14 MED ORDER — BUDESONIDE-FORMOTEROL FUMARATE 160-4.5 MCG/ACT IN AERO: 2.0000 | INHALATION_SPRAY | Freq: Two times a day (BID) | RESPIRATORY_TRACT | Status: DC

## 2012-04-14 NOTE — Telephone Encounter (Signed)
Pt last seen by MW 06-26-12:  Patient Instructions     Stop taking the symbicort perfectly regularly and just use it as needed when short of breath up 2 puffs every 12 hours with spacer as instructed  Return to pulmonary clinic for any unexplained cough or shortness of breath.    Apologized to patient that his symbicort was denied; pt aware he is due for follow up.  Ov with MW on 6.24.13 at 1030.  Pt okay with this date and time.  Refills on symbicort sent to [facility].  Pt aware to call sooner if needed.

## 2012-05-01 ENCOUNTER — Ambulatory Visit (INDEPENDENT_AMBULATORY_CARE_PROVIDER_SITE_OTHER)
Admission: RE | Admit: 2012-05-01 | Discharge: 2012-05-01 | Disposition: A | Discharge status: Home / Self Care | Payer: Medicare Other | Source: Ambulatory Visit | Attending: Internal Medicine | Admitting: Internal Medicine

## 2012-05-01 ENCOUNTER — Ambulatory Visit (INDEPENDENT_AMBULATORY_CARE_PROVIDER_SITE_OTHER): Payer: Medicare Other | Admitting: Internal Medicine

## 2012-05-01 ENCOUNTER — Encounter: Payer: Self-pay | Admitting: Internal Medicine

## 2012-05-01 VITALS — BP 110/64 | HR 65 | Temp 97.6°F | Ht 69.0 in | Wt 180.0 lb

## 2012-05-01 DIAGNOSIS — R0600 Dyspnea, unspecified: Secondary | ICD-10-CM

## 2012-05-01 DIAGNOSIS — J449 Chronic obstructive pulmonary disease, unspecified: Secondary | ICD-10-CM

## 2012-05-01 DIAGNOSIS — A318 Other mycobacterial infections: Secondary | ICD-10-CM

## 2012-05-01 DIAGNOSIS — R0989 Other specified symptoms and signs involving the circulatory and respiratory systems: Secondary | ICD-10-CM

## 2012-05-01 DIAGNOSIS — R0609 Other forms of dyspnea: Secondary | ICD-10-CM

## 2012-05-01 NOTE — Progress Notes (Signed)
  Subjective:    Patient ID: [name], male    DOB: 1923-12-09.   MRN: [id number]  Brief patient profile:  87 yowm quit smoking in 1953 with minimal airflow obstruction by PFT's 12/07 and evidence of MAI infection by previous FOB (see PMHX)   September 08, 2009 Acute visit. Pt c/o prod cough with yellow sputum x 1 wk. Pt states that his breathing has been okay but he does state that lying flat on his back can make him SOB. better on mucinex. Wife reports more hoarseness x 1 year ? advair related. symbicort 160 two times a day    05/01/2012 f/u ov/Jilliana Burkes found couldn't do s symbicort  cc variable worse overall sob x 2 months, indolent onset, gradually worse, esp at hs but not typically with exertion, doesn't wake up with it, better if prop up, no assoc cough, no leg swelling, hfa poor even  with spacer  Denies any obvious fluctuation of symptoms with weather or environmental changes or other aggravating or alleviating factors.    ROS  At present neg for  any significant sore throat, dysphagia, dental problems, itching, sneezing,  nasal congestion or excess/ purulent secretions, ear ache,   fever, chills, sweats, unintended wt loss, pleuritic or exertional cp, hemoptysis, palpitations, orthopnea pnd or leg swelling.  Also denies presyncope, palpitations, heartburn, abdominal pain, anorexia, nausea, vomiting, diarrhea  or change in bowel or urinary habits, change in stools or urine, dysuria,hematuria,  rash, arthralgias, visual complaints, headache, numbness weakness or ataxia or problems with walking or coordination. No noted change in mood/affect or memory.       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)  - FOB 3/98  - Rx 3/06 > 11/06  COPD (ICD-496)  - PFT's with minimal changes 10/2006 - HFA 50% January 17, 2010 > 50% 06/27/2011 with spacer added for  hoarseness Diverticulosis.......................................................................................[facility]  Health Maintenance.............................................................................Marland KitchenJacky Kindle  - Pneumovax 2/04 second shot  Right DVT  - 01/2010 >>coumadin       Objective:   Physical Exam  wt 186 November 22, 2008 > 182 September 25, 2010 >   184 02/13/2011 > 182 06/27/2011 > 05/01/2012 180 amb chronically ill wm nad walks stooped over HEENT mild turbinate edema. Oropharynx no thrush or excess pnd or cobblestoning. No JVD or cervical adenopathy. Mild accessory muscle hypertrophy. Trachea midline, nl thryroid. Chest was hyperinflated by percussion with diminished breath sounds and moderate increased exp time without wheeze. Hoover sign positive at mid inspiration. Regular rate and rhythm without murmur gallop or rub or increase P2 no edema. Abd: no hsm, nl excursion. Ext warm without calf tenderness or clubbing.       cxr  05/01/12 1. Chronic pulmonary probable scarring.  2. Developing fluid level in right upper lobe cavitary lesion My review : no air fluid level in "cavity"      Assessment & Plan:

## 2012-05-01 NOTE — Patient Instructions (Addendum)
Work on inhaler technique:  relax and gently blow all the way out then take a nice smooth deep breath back in, triggering the inhaler right before you start breathing in.  Hold for up to 5 seconds if you can.  Rinse and gargle with water when done   If your mouth or throat starts to bother you,   I suggest you time the inhaler to your dental care and after using the inhaler(s) brush teeth and tongue with a baking soda containing toothpaste and when you rinse this out, gargle with it first to see if this helps your mouth and throat.   Try prilosec 20mg   Take 30-60 min before first meal of the day and Pepcid 20 mg one bedtime automatically whether you think you need it or not  Also only take aspirin with breakfast.  Please remember to go to the lab and x-ray department downstairs for your tests - we will call you with the results when they are available.    See Tammy NP w/in 2 weeks with all your medications, even over the counter meds, separated in two separate bags, the ones you take no matter what vs the ones you stop once you feel better and take only as needed when you feel you need them.   Tammy  will generate for you a new user friendly medication calendar that will put [place] all on the same page re: your medication use.     Without this process, it simply isn't possible to assure that we are providing  your outpatient care  with  the attention to detail we feel you deserve.   If we cannot assure that you're getting that kind of care,  then we cannot manage your problem effectively from this clinic.  Once you have seen Tammy and we are sure that we're all on the same page with your medication use she will arrange follow up with me.   Late add: Needs to go to lab at f/u ov

## 2012-05-02 NOTE — Assessment & Plan Note (Signed)
cxr's reviewed > no convincing evidence of radiographic progression and no clinical evidence of infection at this point so no rx needed.

## 2012-05-02 NOTE — Assessment & Plan Note (Addendum)
DDX of  difficult airways managment all start with A and  include Adherence, Ace Inhibitors, Acid Reflux, Active Sinus Disease, Alpha 1 Antitripsin deficiency, Anxiety masquerading as Airways dz,  ABPA,  allergy(esp in young), Aspiration (esp in elderly), Adverse effects of DPI,  Active smokers, plus two Bs  = Bronchiectasis and Beta blocker use..and one C= CHF   In absence of elevated bnp(pending as apparently didn't go to lab as requested) and given hx of much worse off symbicort along with variable noct sob he likely has an asthmatic component ? gerd or sinus related (absence of change in min cough favors former over latter  The proper method of use, as well as anticipated side effects, of a metered-dose inhaler are discussed and demonstrated to the patient. Improved effectiveness after extensive coaching during this visit to a level of approximately  50% despite spacer.  If can't do better may need to consider bud/performist   In meantime rx GERD aggressively then return for re-eval    Each maintenance medication was reviewed in detail including most importantly the difference between maintenance and as needed and under what circumstances the prns are to be used.  Please see instructions for details which were reviewed in writing and the patient given a copy.

## 2012-05-04 NOTE — Progress Notes (Signed)
Quick Note:  Spoke with pt and notified of results per Dr. Wert. Pt verbalized understanding and denied any questions.  ______ 

## 2012-05-11 ENCOUNTER — Ambulatory Visit: Payer: Medicare Other | Admitting: Internal Medicine

## 2012-05-15 ENCOUNTER — Other Ambulatory Visit (INDEPENDENT_AMBULATORY_CARE_PROVIDER_SITE_OTHER): Payer: Medicare Other

## 2012-05-15 ENCOUNTER — Ambulatory Visit (INDEPENDENT_AMBULATORY_CARE_PROVIDER_SITE_OTHER): Payer: Medicare Other | Admitting: Adult Health

## 2012-05-15 ENCOUNTER — Encounter: Payer: Self-pay | Admitting: Adult Health

## 2012-05-15 VITALS — BP 110/62 | HR 63 | Temp 98.0°F | Ht 69.0 in | Wt 180.6 lb

## 2012-05-15 DIAGNOSIS — R0609 Other forms of dyspnea: Secondary | ICD-10-CM

## 2012-05-15 DIAGNOSIS — J449 Chronic obstructive pulmonary disease, unspecified: Secondary | ICD-10-CM

## 2012-05-15 DIAGNOSIS — R0600 Dyspnea, unspecified: Secondary | ICD-10-CM

## 2012-05-15 LAB — BASIC METABOLIC PANEL
BUN: 23 mg/dL (ref 6–23)
Chloride: 98 mEq/L (ref 96–112)
GFR: 65.43 mL/min (ref >60.00)
Glucose, Bld: 81 mg/dL (ref 70–99)
Potassium: 4.3 mEq/L (ref 3.5–5.1)
Sodium: 133 mEq/L — ABNORMAL LOW (ref 135–145)

## 2012-05-15 LAB — TSH: TSH: 3.46 u[IU]/mL (ref 0.35–5.50)

## 2012-05-15 LAB — CBC WITH DIFFERENTIAL/PLATELET
Basophils Relative: 0.6 % (ref 0.0–3.0)
Eosinophils Relative: 1 % (ref 0.0–5.0)
HCT: 32.8 % — ABNORMAL LOW (ref 39.0–52.0)
Lymphs Abs: 1.6 10*3/uL (ref 0.7–4.0)
MCV: 102.9 fl — ABNORMAL HIGH (ref 78.0–100.0)
Monocytes Absolute: 0.9 10*3/uL (ref 0.1–1.0)
Monocytes Relative: 12.8 % — ABNORMAL HIGH (ref 3.0–12.0)
RBC: 3.19 Mil/uL — ABNORMAL LOW (ref 4.22–5.81)
WBC: 7.2 10*3/uL (ref 4.5–10.5)

## 2012-05-15 LAB — BRAIN NATRIURETIC PEPTIDE: Pro B Natriuretic peptide (BNP): 125 pg/mL — ABNORMAL HIGH (ref 0.0–100.0)

## 2012-05-15 NOTE — Patient Instructions (Addendum)
Continue on current regimen.  Follow medication calendar closely and bring to each visit.  Go downstairs for lab work  follow up Dr. Sherene Sires  In 3-4 months and As needed

## 2012-05-15 NOTE — Progress Notes (Signed)
  Subjective:    Patient ID: [name], male    DOB: 01/19/1924.   MRN: [id number]  Brief patient profile:  87 yowm quit smoking in 1953 with minimal airflow obstruction by PFT's 12/07 and evidence of MAI infection by previous FOB (see PMHX)   September 08, 2009 Acute visit. Pt c/o prod cough with yellow sputum x 1 wk. Pt states that his breathing has been okay but he does state that lying flat on his back can make him SOB. better on mucinex. Wife reports more hoarseness x 1 year ? advair related. symbicort 160 two times a day    05/01/2012 f/u ov/Wert found couldn't do s symbicort  cc variable worse overall sob x 2 months, indolent onset, gradually worse, esp at hs but not typically with exertion, doesn't wake up with it, better if prop up, no assoc cough, no leg swelling, hfa poor even  with spacer >>PPI/pepcid rx , CXR w/ chronic changes, no acute process.   05/15/2012 Follow up and Med review  Returns for follow up and med review.  We reviewed all his meds and organized them into a med calendar w/ pt education His wife helps him with his meds.  Unable to tolerate PPI and Pepcid-causes stomach ache and sore throat.  Breathing is at baseline with no increased cough/congestion or fever.    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)  - FOB 3/98  - Rx 3/06 > 11/06  COPD (ICD-496)  - PFT's with minimal changes 10/2006 - HFA 50% January 17, 2010 > 50% 06/27/2011 with spacer added for hoarseness Diverticulosis.......................................................................................[facility]  Health Maintenance.............................................................................Marland KitchenJacky Kindle  - Pneumovax 2/04 second shot  Right DVT  - 01/2010 >>coumadin  Complex med regimen>med calendar 05/15/2012       Objective:   Physical Exam  wt 186 November 22, 2008 > 182 September 25, 2010 >   184 02/13/2011 > 182 06/27/2011 > 05/01/2012  180 >180 05/15/2012  amb chronically ill wm nad walks stooped over HEENT mild turbinate edema. Oropharynx no thrush or excess pnd or cobblestoning. No JVD or cervical adenopathy. Mild accessory muscle hypertrophy. Trachea midline, nl thryroid. Chest was hyperinflated by percussion with diminished breath sounds and moderate increased exp time without wheeze. Hoover sign positive at mid inspiration. Regular rate and rhythm without murmur gallop or rub or increase P2 no edema. Abd: no hsm, nl excursion. Ext warm without calf tenderness or clubbing.       cxr  05/01/12 1. Chronic pulmonary probable scarring.  2. Developing fluid level in right upper lobe cavitary lesion Dr. Sherene Sires  review : no air fluid level in "cavity"      Assessment & Plan:

## 2012-05-15 NOTE — Addendum Note (Signed)
Addended by: Boone Master E on: 05/15/2012 12:45 PM   Modules accepted: Orders

## 2012-05-15 NOTE — Assessment & Plan Note (Signed)
Compensated on present regimen.  Patient's medications were reviewed today and patient education was given. Computerized medication calendar was adjusted/completed follow up labs as recommended from last ov follow up Dr. Sherene Sires  In 3-4 months and As needed

## 2012-05-18 ENCOUNTER — Telehealth: Payer: Self-pay | Admitting: Internal Medicine

## 2012-05-18 NOTE — Progress Notes (Signed)
Quick Note:  Spoke with pt and notified of results per Dr. Wert. Pt verbalized understanding and denied any questions.  ______ 

## 2012-05-18 NOTE — Telephone Encounter (Signed)
I spoke with pt's spouse and notified of results/recs per MW. She verbalized understanding and states nothing further needed.

## 2012-07-04 ENCOUNTER — Emergency Department (HOSPITAL_COMMUNITY): Payer: Medicare Other

## 2012-07-04 ENCOUNTER — Encounter (HOSPITAL_COMMUNITY): Payer: Self-pay | Admitting: Emergency Medicine

## 2012-07-04 ENCOUNTER — Inpatient Hospital Stay (HOSPITAL_COMMUNITY)
Admission: EM | Admit: 2012-07-04 | Discharge: 2012-07-07 | DRG: 309 | Disposition: A | Discharge status: Skilled Nursing Facility | Payer: Medicare Other | Attending: Internal Medicine | Admitting: Internal Medicine

## 2012-07-04 DIAGNOSIS — R4182 Altered mental status, unspecified: Secondary | ICD-10-CM | POA: Diagnosis present

## 2012-07-04 DIAGNOSIS — I4891 Unspecified atrial fibrillation: Principal | ICD-10-CM | POA: Diagnosis present

## 2012-07-04 DIAGNOSIS — G459 Transient cerebral ischemic attack, unspecified: Secondary | ICD-10-CM | POA: Diagnosis present

## 2012-07-04 DIAGNOSIS — I10 Essential (primary) hypertension: Secondary | ICD-10-CM | POA: Diagnosis present

## 2012-07-04 DIAGNOSIS — I2510 Atherosclerotic heart disease of native coronary artery without angina pectoris: Secondary | ICD-10-CM | POA: Diagnosis present

## 2012-07-04 DIAGNOSIS — M47817 Spondylosis without myelopathy or radiculopathy, lumbosacral region: Secondary | ICD-10-CM | POA: Diagnosis present

## 2012-07-04 DIAGNOSIS — E871 Hypo-osmolality and hyponatremia: Secondary | ICD-10-CM | POA: Diagnosis present

## 2012-07-04 DIAGNOSIS — E876 Hypokalemia: Secondary | ICD-10-CM | POA: Diagnosis present

## 2012-07-04 DIAGNOSIS — J4489 Other specified chronic obstructive pulmonary disease: Secondary | ICD-10-CM | POA: Diagnosis present

## 2012-07-04 DIAGNOSIS — W010XXA Fall on same level from slipping, tripping and stumbling without subsequent striking against object, initial encounter: Secondary | ICD-10-CM | POA: Diagnosis present

## 2012-07-04 DIAGNOSIS — J449 Chronic obstructive pulmonary disease, unspecified: Secondary | ICD-10-CM | POA: Diagnosis present

## 2012-07-04 DIAGNOSIS — R4701 Aphasia: Secondary | ICD-10-CM | POA: Diagnosis present

## 2012-07-04 DIAGNOSIS — E039 Hypothyroidism, unspecified: Secondary | ICD-10-CM | POA: Diagnosis present

## 2012-07-04 DIAGNOSIS — Y92009 Unspecified place in unspecified non-institutional (private) residence as the place of occurrence of the external cause: Secondary | ICD-10-CM

## 2012-07-04 DIAGNOSIS — Z9861 Coronary angioplasty status: Secondary | ICD-10-CM

## 2012-07-04 DIAGNOSIS — W19XXXA Unspecified fall, initial encounter: Secondary | ICD-10-CM

## 2012-07-04 LAB — BLOOD GAS, VENOUS
Acid-Base Excess: 1.6 mmol/L (ref 0.0–2.0)
Bicarbonate: 25.1 meq/L — ABNORMAL HIGH (ref 20.0–24.0)
Drawn by: 362341
FIO2: 0.21 %
O2 Saturation: 28.2 %
Patient temperature: 98.6
TCO2: 22.7 mmol/L (ref 0–100)
pCO2, Ven: 37.5 mmHg — ABNORMAL LOW (ref 45.0–50.0)
pH, Ven: 7.44 — ABNORMAL HIGH (ref 7.250–7.300)
pO2, Ven: 19.9 mmHg — CL (ref 30.0–45.0)

## 2012-07-04 LAB — CBC WITH DIFFERENTIAL/PLATELET
Basophils Absolute: 0 10*3/uL (ref 0.0–0.1)
Eosinophils Relative: 0 % (ref 0–5)
HCT: 37.8 % — ABNORMAL LOW (ref 39.0–52.0)
Hemoglobin: 12.8 g/dL — ABNORMAL LOW (ref 13.0–17.0)
Lymphocytes Relative: 17 % (ref 12–46)
Lymphs Abs: 1.5 10*3/uL (ref 0.7–4.0)
MCV: 99.2 fL (ref 78.0–100.0)
Monocytes Absolute: 0.7 10*3/uL (ref 0.1–1.0)
Monocytes Relative: 8 % (ref 3–12)
Neutro Abs: 6.5 10*3/uL (ref 1.7–7.7)
RBC: 3.81 MIL/uL — ABNORMAL LOW (ref 4.22–5.81)
RDW: 14.6 % (ref 11.5–15.5)
WBC: 8.7 10*3/uL (ref 4.0–10.5)

## 2012-07-04 LAB — URINE MICROSCOPIC-ADD ON

## 2012-07-04 LAB — URINALYSIS, ROUTINE W REFLEX MICROSCOPIC
Bilirubin Urine: NEGATIVE
Nitrite: NEGATIVE
Specific Gravity, Urine: 1.017 (ref 1.005–1.030)
Urobilinogen, UA: 1 mg/dL (ref 0.0–1.0)
pH: 7.5 (ref 5.0–8.0)

## 2012-07-04 LAB — PROTIME-INR
INR: 1.1 (ref 0.00–1.49)
Prothrombin Time: 14.4 s (ref 11.6–15.2)

## 2012-07-04 LAB — BASIC METABOLIC PANEL
CO2: 24 mEq/L (ref 19–32)
Chloride: 96 mEq/L (ref 96–112)
Creatinine, Ser: 1.19 mg/dL (ref 0.50–1.35)
GFR calc Af Amer: 62 mL/min — ABNORMAL LOW (ref >90)
Potassium: 5.3 mEq/L — ABNORMAL HIGH (ref 3.5–5.1)
Sodium: 129 mEq/L — ABNORMAL LOW (ref 135–145)

## 2012-07-04 LAB — TROPONIN I: Troponin I: <0.3 ng/mL

## 2012-07-04 LAB — MAGNESIUM: Magnesium: 2.1 mg/dL (ref 1.5–2.5)

## 2012-07-04 LAB — APTT: aPTT: 26 seconds (ref 24–37)

## 2012-07-04 MED ORDER — DILTIAZEM HCL 60 MG PO TABS
60.0000 mg | ORAL_TABLET | Freq: Once | ORAL | Status: AC
  Administered 2012-07-04: 60 mg via ORAL
  Filled 2012-07-04: qty 1

## 2012-07-04 MED ORDER — SODIUM CHLORIDE 0.9 % IV BOLUS (SEPSIS)
1000.0000 mL | Freq: Once | INTRAVENOUS | Status: AC
  Administered 2012-07-04: 1000 mL via INTRAVENOUS

## 2012-07-04 MED ORDER — SODIUM CHLORIDE 0.9 % IV SOLN
Freq: Once | INTRAVENOUS | Status: AC
  Administered 2012-07-04: 21:00:00 via INTRAVENOUS

## 2012-07-04 NOTE — H&P (Signed)
PCP:   Minda Meo, MD   Chief Complaint:  AMS, new afib   HPI: Patient is an 78 year old male, seen last in our office at [facility] by our NP as a work in on the 9th for L sided facial pain with chewing, treated conservatively.  He had a fall this morning at about 11am and according to his wife has had some difficulty expressing himself ever since.  He got up late and she really did not see him before the incident, but he was his usual self last night.  She fed him breakfast after the fall without difficulty.  He was brought to the ER where he has had a workup consisting of a normal CT head and neck, negative U/A and a slightly low Na and slightly high K+.  Of note however, his EKG shows Afib + RVR, which by record is new for him.  He was previously on Coumadin for DVT in the past and has been followed at [facility] and is treated for CAD medically.  Review of Systems:  Review of Systems General:       Complains of fatigue.        Denies fevers, chills, headache, sweats, anorexia, malaise, weight loss.   Cardiovascular:       Denies chest pains, claudication, palpitations, syncope, dyspnea on exertion, orthopnea, PND, peripheral edema.   Respiratory:       Denies cough, excessive sputum, hemoptysis, wheezing.   Gastrointestinal:       Denies nausea, vomiting, diarrhea, constipation, heartburn, change in bowel habits, abdominal pain, melena, hematochezia, jaundice.   Genitourinary:       Denies dysuria, hematuria, discharge, urinary frequency, urinary hesitancy, nocturia, incontinence, genital sores, erectile dysfunction, decreased libido.   Musculoskeletal:        Denies joint swelling, muscle cramps, muscle weakness, stiffness, arthritis.   Skin:       Old bruising, abrasions R arm from recent fall.  Neurologic:       Denies radicular symptoms, weakness, paresthesias, seizures, syncope, tremors, vertigo, dizziness, gait instability.   Psychiatric:   Denies Family notes difficulty with expressing self.  Past Medical History: Past Medical History  Diagnosis Date  . Allergic rhinitis   . 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 COPD (chronic obstructive pulmonary disease)   . Diverticula of colon   . Right leg DVT 01/2010  . IHD (ischemic heart disease)   . Hyperlipidemia   . Hypertension   . Depression   . Myalgia   . Hypothyroidism   . Bronchiectasis   . Spinal stenosis   . History of heartburn   . Joint pain   . 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 Coronary artery disease    Past Surgical History  Procedure Date  . Cardiac catheterization 06/13/2000    normal. ef 60%  . Cardiovascular stress test 04/2010    ef 54%, and normal perfusion  . Coronary angioplasty 1991    1st diagonal  . Carpal tunnel release     right  . Rotator cuff repair     left shoulder  . Knee surgery August  2006    S/P right knee replacement  . Laminectomy     2005  . Cataract extraction     Bilateral     Medications: Prior to Admission medications   Medication Sig Start Date End Date Taking? Authorizing Provider  acetaminophen (TYLENOL) 500 MG  tablet Take 500 mg by mouth every 6 (six) hours as needed. pain   Yes Historical Provider, MD  aspirin 325 MG tablet Take 325 mg by mouth daily.   Yes Historical Provider, MD  budesonide-formoterol (SYMBICORT) 160-4.5 MCG/ACT inhaler Inhale 2 puffs into the lungs 2 (two) times daily. 04/14/12  Yes Nyoka Cowden, MD  diltiazem (CARDIZEM CD) 300 MG 24 hr capsule Take 300 mg by mouth daily.    Yes Historical Provider, MD  famotidine (PEPCID) 20 MG tablet Take 20 mg by mouth at bedtime.   Yes Historical Provider, MD  fluticasone (FLONASE) 50 MCG/ACT nasal spray Place 2 sprays into the nose 2 (two) times daily.   Yes Historical Provider, MD  levothyroxine (SYNTHROID, LEVOTHROID) 50 MCG tablet Take 50 mcg by mouth daily.     Yes Historical Provider, MD  meloxicam (MOBIC)  7.5 MG tablet Take 7.5 mg by mouth 2 (two) times daily.   Yes Historical Provider, MD  Multiple Vitamins-Minerals (PRESERVISION AREDS) CAPS Take 1 capsule by mouth daily.   Yes Historical Provider, MD  nitroGLYCERIN (NITROSTAT) 0.4 MG SL tablet Place 0.4 mg under the tongue every 5 (five) minutes as needed. May repeat x3    Historical Provider, MD    Allergies:   Allergies  Allergen Reactions  . Sulfonamide Derivatives Other (See Comments)    unknown  . Penicillins Rash    Social History:  reports that he quit smoking about 59 years ago. His smoking use included Cigarettes. He has a 25 pack-year smoking history. He has never used smokeless tobacco. He reports that he does not drink alcohol or use illicit drugs.  Family History: Family History  Problem Relation Age of Onset  . Heart disease Father   . Heart failure Father   . Breast cancer Maternal Aunt     Physical Exam: Filed Vitals:   07/04/12 1742 07/04/12 1800 07/04/12 2000 07/04/12 2100  BP: 125/84 141/80 124/74 107/67  Pulse: 72 120 111 92  Temp: 99.2 F (37.3 C)     TempSrc: Oral     Resp: 23 21 24 26   SpO2: 95% 96% 96% 96%   General appearance: alert, cooperative, appears stated age and no distress Head: Normocephalic, without obvious abnormality, atraumatic Eyes: conjunctivae/corneas clear. PERRL, EOM's intact.  Nose: Nares normal. Septum midline. Mucosa normal. No drainage or sinus tenderness. Throat: lips, mucosa, and tongue normal; teeth and gums normal Neck: no adenopathy, no carotid bruit, no JVD and thyroid not enlarged, symmetric, no tenderness/mass/nodules Resp: clear to auscultation bilaterally Cardio: irregularly irregular rhythm GI: soft, non-tender; bowel sounds normal; no masses,  no organomegaly Extremities: extremities normal, atraumatic, no cyanosis or edema Pulses: 2+ and symmetric Lymph nodes: Cervical adenopathy: no cervical lymphadenopathy Neurologic: Alert but disoriented., Normal strength  and tone. Normal symmetric reflexes.     Labs on Admission:   [facility] 07/04/12 1915  NA 129*  K 5.3*  CL 96  CO2 24  GLUCOSE 90  BUN 20  CREATININE 1.19  CALCIUM 9.2  MG 2.1  PHOS 2.7    Basename 07/04/12 1915  CKTOTAL --  CKMB --  CKMBINDEX --  TROPONINI <0.30   Lab Results  Component Value Date   INR 1.10 07/04/2012   INR 2.19* 05/03/2010   INR 2.29* 05/02/2010   Radiological Exams on Admission: Dg Chest 2 View  07/04/2012  *RADIOLOGY REPORT*  Clinical Data: Chest pain, COPD.  CHEST - 2 VIEW  Comparison: 05/01/2012  Findings: Again noted is the cavitary  lesion in the right upper lobe.  Previously seen air fluid level not visualized on the current study.  Chronic scarring in both upper lobes.  Heart is upper limits normal in size.  No effusions or acute airspace opacities.  IMPRESSION: Stable chronic upper lobe densities bilaterally compatible with scarring.  Cavitary lesion in the right upper lobe again noted.  No current air-fluid level as seen previously.  Original Report Authenticated By: Cyndie Chime, M.D.   Ct Head Wo Contrast  07/04/2012  *RADIOLOGY REPORT*  Clinical Data:  Fall, altered mental status.  CT HEAD WITHOUT CONTRAST CT CERVICAL SPINE WITHOUT CONTRAST  Technique:  Multidetector CT imaging of the head and cervical spine was performed following the standard protocol without intravenous contrast.  Multiplanar CT image reconstructions of the cervical spine were also generated.  Comparison:  10/15/2011.  CT cervical spine 12/30/2006  CT HEAD  Findings: There is atrophy and chronic small vessel disease changes.  No acute intracranial abnormality.  Specifically, no hemorrhage, hydrocephalus, mass lesion, acute infarction, or significant intracranial injury.  No acute calvarial abnormality.  Rounded soft tissue in the maxillary sinuses bilaterally, likely mucous retention cysts or polyps.  Mastoids are clear.  IMPRESSION: No acute intracranial abnormality.  Atrophy,  chronic microvascular disease.  CT CERVICAL SPINE  Findings: Advanced degenerative disc disease throughout the cervical spine.  Moderate to advanced degenerative facet disease bilaterally.  Normal alignment.  Severe arthritic changes at the atlantoaxial articulation.  Severe erosions within the odontoid. No acute fracture.  Prevertebral soft tissues are normal.  No epidural or paraspinal hematoma.  Imaging into the apices demonstrates scarring in the upper lobes bilaterally.  Partially imaged is the cavitary lesions seen in the right upper lobe on prior chest radiographs.  IMPRESSION: Severe spondylosis.  No acute bony abnormality.  Original Report Authenticated By: Cyndie Chime, M.D.   Ct Cervical Spine Wo Contrast  07/04/2012  *RADIOLOGY REPORT*  Clinical Data:  Fall, altered mental status.  CT HEAD WITHOUT CONTRAST CT CERVICAL SPINE WITHOUT CONTRAST  Technique:  Multidetector CT imaging of the head and cervical spine was performed following the standard protocol without intravenous contrast.  Multiplanar CT image reconstructions of the cervical spine were also generated.  Comparison:  10/15/2011.  CT cervical spine 12/30/2006  CT HEAD  Findings: There is atrophy and chronic small vessel disease changes.  No acute intracranial abnormality.  Specifically, no hemorrhage, hydrocephalus, mass lesion, acute infarction, or significant intracranial injury.  No acute calvarial abnormality.  Rounded soft tissue in the maxillary sinuses bilaterally, likely mucous retention cysts or polyps.  Mastoids are clear.  IMPRESSION: No acute intracranial abnormality.  Atrophy, chronic microvascular disease.  CT CERVICAL SPINE  Findings: Advanced degenerative disc disease throughout the cervical spine.  Moderate to advanced degenerative facet disease bilaterally.  Normal alignment.  Severe arthritic changes at the atlantoaxial articulation.  Severe erosions within the odontoid. No acute fracture.  Prevertebral soft tissues are  normal.  No epidural or paraspinal hematoma.  Imaging into the apices demonstrates scarring in the upper lobes bilaterally.  Partially imaged is the cavitary lesions seen in the right upper lobe on prior chest radiographs.  IMPRESSION: Severe spondylosis.  No acute bony abnormality.  Original Report Authenticated By: Cyndie Chime, M.D.   Orders placed during the hospital encounter of 07/04/12  . EKG 12-LEAD  . EKG 12-LEAD    Assessment/Plan 1) AMS- Since he has new onset Afib, I am concerned that he either has had an embolic CVA  or it is related to a low output cardic condition.  Despite his family noting fevers, he is afebrile with a benign u/a, CXR without acuity and normal WBC.  Blood cultures drawn in ER, will follow fever curve and retest based on symptoms.  Neuro consult recommended MRI to ER MD, will order. 2) Afib-  Will await MRI result and if negative, consider Pradaxa.  Echo will be ordered and will have Cards see him in consult in the morning, as he is associated with [facility].  Added a dose of PO Cardizem in the ER as his wife says that he did not get any of his meds today, which probably is not helping his rate control.   3) COPD- Continue inhaler, benign appearing. 4) Hypothyroid- Check TSH, continue replacement, especially due to new Afib. 5)Hyponatremia- IV fluids 6) Hypokalemia- Replace by IV fluids. 7) Hypotension- relative, will use BP meds mostly for rate control, will keep in bed tonight.  TISOVEC,RICHARD W 07/04/2012, 10:34 PM

## 2012-07-04 NOTE — ED Notes (Signed)
ZOX:[id number]<AV> Expected date:07/04/12<BR> Expected time: 4:52 PM<BR> Means of arrival:Ambulance<BR> Comments:<BR> Weakness

## 2012-07-04 NOTE — Consult Note (Signed)
Referring Physician: Rhunette Croft    Chief Complaint: Confusion  HPI: [name] is an 78 y.o. male who had a fall in the bathroom today.  He was in the bathroom alone and does not remember the fall.  His wife found him and can only report that he did not hit his head and fell on the left side.  She got some assistance to get him to the bed where  She fed him and he rested.  Later in the after noon when he attempted to get up he was unable to get from the side of the bed and was confused. He has remained confused and not at his baseline.  He was brought in for evaluation at that time.  CT of the head has been performed in work up that shows no acute changes.    LSN: 1500 (when confusion began) tPA Given: No: No focal abnormality noted.    Past Medical History  Diagnosis Date  . Allergic rhinitis   . 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 COPD (chronic obstructive pulmonary disease)   . Diverticula of colon   . Right leg DVT 01/2010  . IHD (ischemic heart disease)   . Hyperlipidemia   . Hypertension   . Depression   . Myalgia   . Hypothyroidism   . Bronchiectasis   . Spinal stenosis   . History of heartburn   . Joint pain   . 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 Coronary artery disease     Past Surgical History  Procedure Date  . Cardiac catheterization 06/13/2000    normal. ef 60%  . Cardiovascular stress test 04/2010    ef 54%, and normal perfusion  . Coronary angioplasty 1991    1st diagonal  . Carpal tunnel release     right  . Rotator cuff repair     left shoulder  . Knee surgery August  2006    S/P right knee replacement  . Laminectomy     2005  . Cataract extraction     Bilateral     Family History  Problem Relation Age of Onset  . Heart disease Father   . Heart failure Father   . Breast cancer Maternal Aunt    Social History:  reports that he quit smoking about 59 years ago. His smoking use included Cigarettes. He has  a 25 pack-year smoking history. He has never used smokeless tobacco. He reports that he does not drink alcohol or use illicit drugs.  Allergies:  Allergies  Allergen Reactions  . Sulfonamide Derivatives Other (See Comments)    unknown  . Penicillins Rash    Medications: I have reviewed the patient's current medications. Prior to Admission:  Current outpatient prescriptions:acetaminophen (TYLENOL) 500 MG tablet, Take 500 mg by mouth every 6 (six) hours as needed. pain, Disp: , Rfl: ;  aspirin 325 MG tablet, Take 325 mg by mouth daily., Disp: , Rfl: ;  budesonide-formoterol (SYMBICORT) 160-4.5 MCG/ACT inhaler, Inhale 2 puffs into the lungs 2 (two) times daily., Disp: 3 Inhaler, Rfl: 1;  diltiazem (CARDIZEM CD) 300 MG 24 hr capsule, Take 300 mg by mouth daily. , Disp: , Rfl:  famotidine (PEPCID) 20 MG tablet, Take 20 mg by mouth at bedtime., Disp: , Rfl: ;  fluticasone (FLONASE) 50 MCG/ACT nasal spray, Place 2 sprays into the nose 2 (two) times daily., Disp: , Rfl: ;  levothyroxine (SYNTHROID, LEVOTHROID) 50 MCG tablet, Take  50 mcg by mouth daily.  , Disp: , Rfl: ;  meloxicam (MOBIC) 7.5 MG tablet, Take 7.5 mg by mouth 2 (two) times daily., Disp: , Rfl:  Multiple Vitamins-Minerals (PRESERVISION AREDS) CAPS, Take 1 capsule by mouth daily., Disp: , Rfl: ;  nitroGLYCERIN (NITROSTAT) 0.4 MG SL tablet, Place 0.4 mg under the tongue every 5 (five) minutes as needed. May repeat x3, Disp: , Rfl:   ROS: Patient confused and unable to obtain information  Physical Examination: Blood pressure 102/57, pulse 86, temperature 99.2 F (37.3 C), temperature source Oral, resp. rate 24, SpO2 96.00%.  Neurologic Examination: Mental Status: Lethargic but easily alerted.  Follows one and two-step commands.  Speech fluent.   Cranial Nerves: II: Discs flat bilaterally; Visual fields grossly normal, pupils equal, round, reactive to light and accommodation III,IV, VI: ptosis not present, extra-ocular motions intact  bilaterally V,VII: smile symmetric, facial light touch sensation normal bilaterally VIII: hearing normal bilaterally IX,X: gag reflex present XI: bilateral shoulder shrug XII: tongue strength normal  Motor: Patient with generalized weakness-lifts LLE less than right and RUE less then left.   Tone and bulk:normal tone throughout; Sensory: Pinprick and light touch decreased in the LLE Deep Tendon Reflexes: 1+ throughout with absent AJ's bilaterally Plantars: Right: equivocal   Left: equivocal Cerebellar: normal finger-to-nose and normal heel-to-shin test   Laboratory Studies:  Basic Metabolic Panel:  Lab 07/04/12 4540  NA 129*  K 5.3*  CL 96  CO2 24  GLUCOSE 90  BUN 20  CREATININE 1.19  CALCIUM 9.2  MG 2.1  PHOS 2.7    Liver Function Tests: No results found for this basename: AST:5,ALT:5,ALKPHOS:5,BILITOT:5,PROT:5,ALBUMIN:5 in the last 168 hours No results found for this basename: LIPASE:5,AMYLASE:5 in the last 168 hours No results found for this basename: AMMONIA:3 in the last 168 hours  CBC:  Lab 07/04/12 1915  WBC 8.7  NEUTROABS 6.5  HGB 12.8*  HCT 37.8*  MCV 99.2  PLT 185    Cardiac Enzymes:  Lab 07/04/12 1915  CKTOTAL --  CKMB --  CKMBINDEX --  TROPONINI <0.30    BNP: No components found with this basename: POCBNP:5  CBG: No results found for this basename: GLUCAP:5 in the last 168 hours  Microbiology: Results for orders placed during the hospital encounter of 05/02/10  MRSA PCR SCREENING     Status: Normal   Collection Time   05/02/10 11:31 AM      Component Value Range Status Comment   MRSA by PCR    NEGATIVE Final    Value: NEGATIVE            The GeneXpert MRSA Assay (FDA     approved for NASAL specimens     only), is one component of a     comprehensive MRSA colonization     surveillance program. It is not     intended to diagnose MRSA     infection nor to guide or     monitor treatment for     MRSA infections.    Coagulation  Studies:  Basename 07/04/12 1915  LABPROT 14.4  INR 1.10    Urinalysis:  Lab 07/04/12 1745  COLORURINE YELLOW  LABSPEC 1.017  PHURINE 7.5  GLUCOSEU NEGATIVE  HGBUR TRACE*  BILIRUBINUR NEGATIVE  KETONESUR NEGATIVE  PROTEINUR 30*  UROBILINOGEN 1.0  NITRITE NEGATIVE  LEUKOCYTESUR NEGATIVE    Lipid Panel:    Component Value Date/Time   CHOL  Value: 117        ATP  III CLASSIFICATION:  <200     mg/dL   Desirable  161-096  mg/dL   Borderline High  >=045    mg/dL   High        02/24/8118 0430   TRIG 38 05/03/2010 0430   HDL 52 05/03/2010 0430   CHOLHDL 2.3 05/03/2010 0430   VLDL 8 05/03/2010 0430   LDLCALC  Value: 57        Total Cholesterol/HDL:CHD Risk Coronary Heart Disease Risk Table                     Men   Women  1/2 Average Risk   3.4   3.3  Average Risk       5.0   4.4  2 X Average Risk   9.6   7.1  3 X Average Risk  23.4   11.0        Use the calculated Patient Ratio above and the CHD Risk Table to determine the patient's CHD Risk.        ATP III CLASSIFICATION (LDL):  <100     mg/dL   Optimal  147-829  mg/dL   Near or Above                    Optimal  130-159  mg/dL   Borderline  562-130  mg/dL   High  >865     mg/dL   Very High 7/84/6962 9528    HgbA1C:  No results found for this basename: HGBA1C    Urine Drug Screen:   No results found for this basename: labopia, cocainscrnur, labbenz, amphetmu, thcu, labbarb    Alcohol Level: No results found for this basename: ETH:2 in the last 168 hours  Imaging: Dg Chest 2 View  07/04/2012  *RADIOLOGY REPORT*  Clinical Data: Chest pain, COPD.  CHEST - 2 VIEW  Comparison: 05/01/2012  Findings: Again noted is the cavitary lesion in the right upper lobe.  Previously seen air fluid level not visualized on the current study.  Chronic scarring in both upper lobes.  Heart is upper limits normal in size.  No effusions or acute airspace opacities.  IMPRESSION: Stable chronic upper lobe densities bilaterally compatible with scarring.  Cavitary  lesion in the right upper lobe again noted.  No current air-fluid level as seen previously.  Original Report Authenticated By: Cyndie Chime, M.D.   Ct Head Wo Contrast  07/04/2012  *RADIOLOGY REPORT*  Clinical Data:  Fall, altered mental status.  CT HEAD WITHOUT CONTRAST CT CERVICAL SPINE WITHOUT CONTRAST  Technique:  Multidetector CT imaging of the head and cervical spine was performed following the standard protocol without intravenous contrast.  Multiplanar CT image reconstructions of the cervical spine were also generated.  Comparison:  10/15/2011.  CT cervical spine 12/30/2006  CT HEAD  Findings: There is atrophy and chronic small vessel disease changes.  No acute intracranial abnormality.  Specifically, no hemorrhage, hydrocephalus, mass lesion, acute infarction, or significant intracranial injury.  No acute calvarial abnormality.  Rounded soft tissue in the maxillary sinuses bilaterally, likely mucous retention cysts or polyps.  Mastoids are clear.  IMPRESSION: No acute intracranial abnormality.  Atrophy, chronic microvascular disease.  CT CERVICAL SPINE  Findings: Advanced degenerative disc disease throughout the cervical spine.  Moderate to advanced degenerative facet disease bilaterally.  Normal alignment.  Severe arthritic changes at the atlantoaxial articulation.  Severe erosions within the odontoid. No acute fracture.  Prevertebral soft tissues are normal.  No  epidural or paraspinal hematoma.  Imaging into the apices demonstrates scarring in the upper lobes bilaterally.  Partially imaged is the cavitary lesions seen in the right upper lobe on prior chest radiographs.  IMPRESSION: Severe spondylosis.  No acute bony abnormality.  Original Report Authenticated By: Cyndie Chime, M.D.   Ct Cervical Spine Wo Contrast  07/04/2012  *RADIOLOGY REPORT*  Clinical Data:  Fall, altered mental status.  CT HEAD WITHOUT CONTRAST CT CERVICAL SPINE WITHOUT CONTRAST  Technique:  Multidetector CT imaging of the  head and cervical spine was performed following the standard protocol without intravenous contrast.  Multiplanar CT image reconstructions of the cervical spine were also generated.  Comparison:  10/15/2011.  CT cervical spine 12/30/2006  CT HEAD  Findings: There is atrophy and chronic small vessel disease changes.  No acute intracranial abnormality.  Specifically, no hemorrhage, hydrocephalus, mass lesion, acute infarction, or significant intracranial injury.  No acute calvarial abnormality.  Rounded soft tissue in the maxillary sinuses bilaterally, likely mucous retention cysts or polyps.  Mastoids are clear.  IMPRESSION: No acute intracranial abnormality.  Atrophy, chronic microvascular disease.  CT CERVICAL SPINE  Findings: Advanced degenerative disc disease throughout the cervical spine.  Moderate to advanced degenerative facet disease bilaterally.  Normal alignment.  Severe arthritic changes at the atlantoaxial articulation.  Severe erosions within the odontoid. No acute fracture.  Prevertebral soft tissues are normal.  No epidural or paraspinal hematoma.  Imaging into the apices demonstrates scarring in the upper lobes bilaterally.  Partially imaged is the cavitary lesions seen in the right upper lobe on prior chest radiographs.  IMPRESSION: Severe spondylosis.  No acute bony abnormality.  Original Report Authenticated By: Cyndie Chime, M.D.    Assessment: 78 y.o. male who presents with altered mental status after a fall.  No evidence of hemorrhage on imaging.  Although patient without history of arrhythmia can not rule out the possibility of a showering of small emboli that may cause a mental status change alone without extensive evidence of focal findings or patchy focal findings.  Patient also has metabolic abnormalities, to include hyponatremia and hyperkalemia, which may affect mental status change as well.      Stroke Risk Factors - hyperlipidemia and hypertension  Plan: 1. HgbA1c, fasting  lipid panel 2. MRI, MRA  of the brain without contrast 3. PT consult, OT consult, Speech consult 4. Would only move further with stroke work up if imaging indicative of an acute ischemic lesion. 5. Continue ASA daily 6. Telemetry monitoring 7. Frequent neuro checks 8. Agree with addressing metabolic issues.   9. Unclear why patient fell-would rule out seizure with EEG    Thana Farr, MD [facility] [phone] 07/04/2012, 11:24 PM

## 2012-07-04 NOTE — ED Notes (Signed)
PER EMS- picked [facility] from [facility], independent living with c/o generalized weakness and lethargy.  Per wife pt slipped out of bed and was unable to get up which is unusual.  Wife also reports that pt fell and then became weak afterwards around 11a.  Pt fell landing on left arm, skin tear present.  Denies head injury.  Pt alert, but disoriented.  Per wife pt's baseline is "alive and well", slightly forgetful at times.  No hx of dementia or alzheimer.   Hx of a-fib.

## 2012-07-04 NOTE — ED Notes (Signed)
Per telephone conversation with lab, pt CBC hasn't resulted due abnormality of blood that requires warming.  Will re-run blood, if warming doesn't work, CBC will need to be redrawn.

## 2012-07-04 NOTE — ED Provider Notes (Addendum)
History     CSN: [id number]  Arrival date & time 07/04/12  1732   First MD Initiated Contact with Patient 07/04/12 1755      Chief Complaint  Patient presents with  . Weakness  . Fall  . Altered Mental Status    (Consider location/radiation/quality/duration/timing/severity/associated sxs/prior treatment) HPI Comments:  78 yo WM with history of CAD, COPD, DVT not on coumadin, HTN, HLD comes in with cc of fall, AMS. LEVEL 5 CAVEAT FOR ALTERED MENTAL STATUS.  Pt's wife reports that patient had a fall around 11 am. Unknown cause, unknown if there was any syncope. Pt had no LOC after the fall, but he has been disoriented, not answering any questions appropriately, and being more sleepy than usual. He has not complained of anything. No hx of PE, + hx of DVT, no hx of syncope in the past, and he has CAD - with last cath showing moderately significant clotting.  Pt also noted to be in afib w/ rvr. No hx of afib in our records, but he is on cardiazem. Wife in unaware of afib hx.  Wife also notes a fever of 102 at home this afternoon.  Patient is a 78 y.o. male presenting with weakness, fall, and altered mental status. The history is provided by the patient, the spouse and medical records.  Weakness The primary symptoms include altered mental status and fever.  Additional symptoms include weakness.  Fall Associated symptoms include a fever. Pertinent negatives include no hematuria.  Altered Mental Status Pertinent negatives include no chest pain.    Past Medical History  Diagnosis Date  . Allergic rhinitis   . 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 COPD (chronic obstructive pulmonary disease)   . Diverticula of colon   . Right leg DVT 01/2010  . IHD (ischemic heart disease)   . Hyperlipidemia   . Hypertension   . Depression   . Myalgia   . Hypothyroidism   . Bronchiectasis   . Spinal stenosis   . History of heartburn   . Joint pain   . 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 Coronary artery disease     Past Surgical History  Procedure Date  . Cardiac catheterization 06/13/2000    normal. ef 60%  . Cardiovascular stress test 04/2010    ef 54%, and normal perfusion  . Coronary angioplasty 1991    1st diagonal  . Carpal tunnel release     right  . Rotator cuff repair     left shoulder  . Knee surgery August  2006    S/P right knee replacement  . Laminectomy     2005  . Cataract extraction     Bilateral     Family History  Problem Relation Age of Onset  . Heart disease Father   . Heart failure Father   . Breast cancer Maternal Aunt     History  Substance Use Topics  . Smoking status: Former Smoker -- 0.5 packs/day for 50 years    Types: Cigarettes    Quit date: 11/18/1952  . Smokeless tobacco: Never Used  . Alcohol Use: No      Review of Systems  Constitutional: Positive for fever and activity change.  HENT: Negative for neck pain.   Respiratory: Negative for cough.   Cardiovascular: Negative for chest pain.  Genitourinary: Negative for dysuria and hematuria.  Neurological: Positive for weakness.  Hematological: Bruises/bleeds easily.  Psychiatric/Behavioral: Positive  for confusion and altered mental status.    Allergies  Sulfonamide derivatives and Penicillins  Home Medications   Current Outpatient Rx  Name Route Sig Dispense Refill  . ACETAMINOPHEN 500 MG PO TABS Oral Take 500 mg by mouth every 6 (six) hours as needed. pain    . ASPIRIN 325 MG PO TABS Oral Take 325 mg by mouth daily.    . BUDESONIDE-FORMOTEROL FUMARATE 160-4.5 MCG/ACT IN AERO Inhalation Inhale 2 puffs into the lungs 2 (two) times daily. 3 Inhaler 1  . DILTIAZEM HCL ER COATED BEADS 300 MG PO CP24 Oral Take 300 mg by mouth daily.     Marland Kitchen FAMOTIDINE 20 MG PO TABS Oral Take 20 mg by mouth at bedtime.    Marland Kitchen FLUTICASONE PROPIONATE 50 MCG/ACT NA SUSP Nasal Place 2 sprays into the nose 2 (two) times daily.    Marland Kitchen LEVOTHYROXINE SODIUM 50  MCG PO TABS Oral Take 50 mcg by mouth daily.      . MELOXICAM 15 MG PO TABS Oral Take 15 mg by mouth daily.    Marland Kitchen PRESERVISION AREDS PO CAPS Oral Take 1 capsule by mouth daily.    Marland Kitchen NITROGLYCERIN 0.4 MG SL SUBL Sublingual Place 0.4 mg under the tongue every 5 (five) minutes as needed. May repeat x3      BP 125/84  Pulse 72  Temp 99.2 F (37.3 C) (Oral)  Resp 23  SpO2 95%  Physical Exam  Nursing note and vitals reviewed. Constitutional: He is oriented to person, place, and time. He appears well-developed.  HENT:  Head: Normocephalic and atraumatic.  Eyes: Conjunctivae and EOM are normal. Pupils are equal, round, and reactive to light.  Neck: Normal range of motion. Neck supple.  Cardiovascular: Normal heart sounds.   No murmur heard.      Irregularly irregular - tachycardia  Pulmonary/Chest: Effort normal and breath sounds normal. No respiratory distress. He has no wheezes.  Abdominal: Soft. Bowel sounds are normal. He exhibits no distension. There is no tenderness. There is no rebound and no guarding.  Musculoskeletal: He exhibits no edema.  Neurological: He is alert and oriented to person, place, and time.  Skin: Skin is warm.       Diffuse bruising over the upper extremity    ED Course  Procedures (including critical care time)   Labs Reviewed  URINALYSIS, ROUTINE W REFLEX MICROSCOPIC  BLOOD GAS, VENOUS  LACTIC ACID, PLASMA  PROTIME-INR  APTT  BASIC METABOLIC PANEL  CBC WITH DIFFERENTIAL  TROPONIN I  CULTURE, BLOOD (ROUTINE X 2)  CULTURE, BLOOD (ROUTINE X 2)  URINE CULTURE   No results found.   No diagnosis found.    MDM   Date: 07/04/2012  Rate: 120  Rhythm: atrial fibrillation  QRS Axis: left  Intervals: normal  ST/T Wave abnormalities: nonspecific ST/T changes  Conduction Disutrbances:left bundle branch block  Narrative Interpretation:   Old EKG Reviewed: changes noted  DDx: Sepsis syndrome ACS syndrome ICH Stroke CHF exacerbation COPD  exacerbation Infection - pneumonia/UTI/Cellulitis PE Dehydration Electrolyte abnormality Tox syndrome  Pt comes in with cc of AMS after a fall. We are not sure what caused the fall. Pt has a afib that is new per our records, and has LBBB that is new -so will need potential evaluation for syncope. With hx of DVT, we will also consider PE workup if Cr is WNL. Pt has 3 SIRs criteria upon arrival. Exam or hx not suggestive of any source of infection - but with AMS,  meningitis/encephalitis are possible. Will get lactate and cultures. CT head to r/o ICH DDx is wide, and patient will be monitored closely.   Derwood Kaplan, MD 07/04/12 1832\  12:13 AM Afib - new hx CHADS2 score is 2 - given the acute neurologic findings, the admitting physician wants MRI done first before starting anticoagulation, which is fair. A-fib - not sure what the etiology is. Likely infection - given subjective fevers. Pt never got home dose of cardiazem, so rate control will be with po meds. Neuro - recommendations are MRI. We also discussed PE possibility, given hx of DVT - the pcp wants to hold off on CT PE at this time. Pt to be admitted. Problem list: -Alterered mental status, may be aphasia - fevers, sepsis of unknown source. NO ANTIBIOTICS provided, pcp aware. - afib  Derwood Kaplan, MD 07/05/12 0016  CRITICAL CARE Performed by: Derwood Kaplan   Total critical care time: 35  Critical care time was exclusive of separately billable procedures and treating other patients.  Critical care was necessary to treat or prevent imminent or life-threatening deterioration.  Critical care was time spent personally by me on the following activities: development of treatment plan with patient and/or surrogate as well as nursing, discussions with consultants, evaluation of patient's response to treatment, examination of patient, obtaining history from patient or surrogate, ordering and performing treatments and  interventions, ordering and review of laboratory studies, ordering and review of radiographic studies, pulse oximetry and re-evaluation of patient's condition.  Derwood Kaplan, MD 07/05/12 [phone]

## 2012-07-05 ENCOUNTER — Encounter (HOSPITAL_COMMUNITY): Payer: Self-pay | Admitting: *Deleted

## 2012-07-05 ENCOUNTER — Inpatient Hospital Stay (HOSPITAL_COMMUNITY): Payer: Medicare Other

## 2012-07-05 DIAGNOSIS — I2510 Atherosclerotic heart disease of native coronary artery without angina pectoris: Secondary | ICD-10-CM

## 2012-07-05 DIAGNOSIS — I4891 Unspecified atrial fibrillation: Secondary | ICD-10-CM

## 2012-07-05 DIAGNOSIS — I359 Nonrheumatic aortic valve disorder, unspecified: Secondary | ICD-10-CM

## 2012-07-05 LAB — CBC
MCH: 38.2 pg — ABNORMAL HIGH (ref 26.0–34.0)
MCHC: 36.5 g/dL — ABNORMAL HIGH (ref 30.0–36.0)
RDW: 15.4 % (ref 11.5–15.5)

## 2012-07-05 LAB — CARDIAC PANEL(CRET KIN+CKTOT+MB+TROPI)
CK, MB: 4.4 ng/mL — ABNORMAL HIGH (ref 0.3–4.0)
CK, MB: 4 ng/mL (ref 0.3–4.0)
Total CK: 1262 U/L — ABNORMAL HIGH (ref 7–232)
Total CK: 1091 U/L — ABNORMAL HIGH (ref 7–232)
Troponin I: <0.3 ng/mL (ref <0.30)

## 2012-07-05 LAB — COMPREHENSIVE METABOLIC PANEL
ALT: 11 U/L (ref 0–53)
Albumin: 2.7 g/dL — ABNORMAL LOW (ref 3.5–5.2)
Alkaline Phosphatase: 49 U/L (ref 39–117)
Calcium: 7.8 mg/dL — ABNORMAL LOW (ref 8.4–10.5)
Potassium: 3.3 mEq/L — ABNORMAL LOW (ref 3.5–5.1)
Sodium: 132 mEq/L — ABNORMAL LOW (ref 135–145)
Total Protein: 6.4 g/dL (ref 6.0–8.3)

## 2012-07-05 LAB — TSH: TSH: 2.652 u[IU]/mL (ref 0.350–4.500)

## 2012-07-05 MED ORDER — FAMOTIDINE 20 MG PO TABS
20.0000 mg | ORAL_TABLET | Freq: Every day | ORAL | Status: DC
  Administered 2012-07-05 – 2012-07-06 (×2): 20 mg via ORAL
  Filled 2012-07-05 (×3): qty 1

## 2012-07-05 MED ORDER — POTASSIUM CHLORIDE CRYS ER 20 MEQ PO TBCR
40.0000 meq | EXTENDED_RELEASE_TABLET | Freq: Once | ORAL | Status: AC
  Administered 2012-07-05: 40 meq via ORAL
  Filled 2012-07-05 (×2): qty 2

## 2012-07-05 MED ORDER — SENNOSIDES-DOCUSATE SODIUM 8.6-50 MG PO TABS
1.0000 | ORAL_TABLET | Freq: Every evening | ORAL | Status: DC | PRN
  Filled 2012-07-05: qty 1

## 2012-07-05 MED ORDER — PROSIGHT PO TABS
1.0000 | ORAL_TABLET | Freq: Every day | ORAL | Status: DC
  Administered 2012-07-05 – 2012-07-07 (×3): 1 via ORAL
  Filled 2012-07-05 (×3): qty 1

## 2012-07-05 MED ORDER — ACETAMINOPHEN 325 MG PO TABS
650.0000 mg | ORAL_TABLET | Freq: Four times a day (QID) | ORAL | Status: DC | PRN
  Administered 2012-07-05 – 2012-07-07 (×3): 650 mg via ORAL
  Filled 2012-07-05 (×4): qty 2

## 2012-07-05 MED ORDER — NITROGLYCERIN 0.4 MG SL SUBL: 0.4000 mg | SUBLINGUAL_TABLET | SUBLINGUAL | Status: DC | PRN

## 2012-07-05 MED ORDER — BUDESONIDE-FORMOTEROL FUMARATE 160-4.5 MCG/ACT IN AERO
2.0000 | INHALATION_SPRAY | Freq: Two times a day (BID) | RESPIRATORY_TRACT | Status: DC
  Administered 2012-07-05 – 2012-07-07 (×5): 2 via RESPIRATORY_TRACT
  Filled 2012-07-05: qty 6

## 2012-07-05 MED ORDER — DILTIAZEM HCL ER COATED BEADS 300 MG PO CP24
300.0000 mg | ORAL_CAPSULE | Freq: Every day | ORAL | Status: DC
  Administered 2012-07-05 – 2012-07-07 (×3): 300 mg via ORAL
  Filled 2012-07-05 (×3): qty 1

## 2012-07-05 MED ORDER — SODIUM CHLORIDE 0.9 % IV SOLN
INTRAVENOUS | Status: DC
  Administered 2012-07-05: 02:00:00 via INTRAVENOUS

## 2012-07-05 MED ORDER — PRESERVISION AREDS PO CAPS: 1.0000 | ORAL_CAPSULE | Freq: Every day | ORAL | Status: DC

## 2012-07-05 MED ORDER — ONDANSETRON HCL 4 MG/2ML IJ SOLN: 4.0000 mg | Freq: Four times a day (QID) | INTRAMUSCULAR | Status: DC | PRN

## 2012-07-05 MED ORDER — ONDANSETRON HCL 4 MG PO TABS: 4.0000 mg | ORAL_TABLET | Freq: Four times a day (QID) | ORAL | Status: DC | PRN

## 2012-07-05 MED ORDER — SODIUM CHLORIDE 0.9 % IJ SOLN
3.0000 mL | Freq: Two times a day (BID) | INTRAMUSCULAR | Status: DC
  Administered 2012-07-05 – 2012-07-07 (×5): 3 mL via INTRAVENOUS

## 2012-07-05 MED ORDER — ASPIRIN 325 MG PO TABS
325.0000 mg | ORAL_TABLET | Freq: Every day | ORAL | Status: DC
  Administered 2012-07-05 – 2012-07-07 (×3): 325 mg via ORAL
  Filled 2012-07-05 (×3): qty 1

## 2012-07-05 MED ORDER — LEVOTHYROXINE SODIUM 50 MCG PO TABS
50.0000 ug | ORAL_TABLET | Freq: Every day | ORAL | Status: DC
  Administered 2012-07-05 – 2012-07-07 (×3): 50 ug via ORAL
  Filled 2012-07-05 (×4): qty 1

## 2012-07-05 MED ORDER — POTASSIUM CHLORIDE IN NACL 20-0.9 MEQ/L-% IV SOLN
INTRAVENOUS | Status: DC
  Filled 2012-07-05: qty 1000

## 2012-07-05 MED ORDER — ACETAMINOPHEN 650 MG RE SUPP: 650.0000 mg | Freq: Four times a day (QID) | RECTAL | Status: DC | PRN

## 2012-07-05 NOTE — Progress Notes (Signed)
2.98 sec Pause in HR called to [facility]. Pt had one pause during the night and another around 10:30. Unable to fit pt in to MRI scanner due to curvature in spine.

## 2012-07-05 NOTE — Progress Notes (Signed)
Subjective: Uneventful night except for a brief pause of 2.4 secs on tele, remains in Afib.  Wife feeding him breakfast.  Says that he seems a bit better, but still has his aphasia.  Imaging with MRI/Echo pending.  Objective: Vital signs in last 24 hours: Temp:  [99.2 F (37.3 C)-99.8 F (37.7 C)] 99.7 F (37.6 C) (08/18 0550) Pulse Rate:  [64-120] 64  (08/18 0550) Resp:  [18-26] 18  (08/18 0550) BP: (102-141)/(57-84) 118/71 mmHg (08/18 0550) SpO2:  [95 %-97 %] 97 % (08/18 0550) Weight:  [80.9 kg (178 lb 5.6 oz)] 80.9 kg (178 lb 5.6 oz) (08/18 0202) Weight change:     Intake/Output from previous day: 08/17 0701 - 08/18 0700 In: 193.3 [I.V.:193.3] Out: 450 [Urine:450] Intake/Output this shift:    General appearance: alert and cooperative Neck: no adenopathy, no carotid bruit, no JVD, supple, symmetrical, trachea midline and thyroid not enlarged, symmetric, no tenderness/mass/nodules Resp: clear to auscultation bilaterally Cardio: irregular rate and rhythm GI: soft, non-tender; bowel sounds normal; no masses,  no organomegaly Neurologic: No new deficits, still aphasic. Lab Results:  Basename 07/05/12 0230 07/04/12 1915  WBC 7.9 8.7  HGB 11.0* 12.8*  HCT 30.1* 37.8*  PLT 184 185   BMET  Basename 07/05/12 0230 07/04/12 1915  NA 132* 129*  K 3.3* 5.3*  CL 101 96  CO2 21 24  GLUCOSE 94 90  BUN 19 20  CREATININE 1.15 1.19  CALCIUM 7.8* 9.2    Studies/Results: Dg Chest 2 View  07/04/2012  *RADIOLOGY REPORT*  Clinical Data: Chest pain, COPD.  CHEST - 2 VIEW  Comparison: 05/01/2012  Findings: Again noted is the cavitary lesion in the right upper lobe.  Previously seen air fluid level not visualized on the current study.  Chronic scarring in both upper lobes.  Heart is upper limits normal in size.  No effusions or acute airspace opacities.  IMPRESSION: Stable chronic upper lobe densities bilaterally compatible with scarring.  Cavitary lesion in the right upper lobe again  noted.  No current air-fluid level as seen previously.  Original Report Authenticated By: Cyndie Chime, M.D.   Ct Head Wo Contrast  07/04/2012  *RADIOLOGY REPORT*  Clinical Data:  Fall, altered mental status.  CT HEAD WITHOUT CONTRAST CT CERVICAL SPINE WITHOUT CONTRAST  Technique:  Multidetector CT imaging of the head and cervical spine was performed following the standard protocol without intravenous contrast.  Multiplanar CT image reconstructions of the cervical spine were also generated.  Comparison:  10/15/2011.  CT cervical spine 12/30/2006  CT HEAD  Findings: There is atrophy and chronic small vessel disease changes.  No acute intracranial abnormality.  Specifically, no hemorrhage, hydrocephalus, mass lesion, acute infarction, or significant intracranial injury.  No acute calvarial abnormality.  Rounded soft tissue in the maxillary sinuses bilaterally, likely mucous retention cysts or polyps.  Mastoids are clear.  IMPRESSION: No acute intracranial abnormality.  Atrophy, chronic microvascular disease.  CT CERVICAL SPINE  Findings: Advanced degenerative disc disease throughout the cervical spine.  Moderate to advanced degenerative facet disease bilaterally.  Normal alignment.  Severe arthritic changes at the atlantoaxial articulation.  Severe erosions within the odontoid. No acute fracture.  Prevertebral soft tissues are normal.  No epidural or paraspinal hematoma.  Imaging into the apices demonstrates scarring in the upper lobes bilaterally.  Partially imaged is the cavitary lesions seen in the right upper lobe on prior chest radiographs.  IMPRESSION: Severe spondylosis.  No acute bony abnormality.  Original Report Authenticated By: Caryn Bee  G. Kearney Hard, M.D.   Ct Cervical Spine Wo Contrast  07/04/2012  *RADIOLOGY REPORT*  Clinical Data:  Fall, altered mental status.  CT HEAD WITHOUT CONTRAST CT CERVICAL SPINE WITHOUT CONTRAST  Technique:  Multidetector CT imaging of the head and cervical spine was performed  following the standard protocol without intravenous contrast.  Multiplanar CT image reconstructions of the cervical spine were also generated.  Comparison:  10/15/2011.  CT cervical spine 12/30/2006  CT HEAD  Findings: There is atrophy and chronic small vessel disease changes.  No acute intracranial abnormality.  Specifically, no hemorrhage, hydrocephalus, mass lesion, acute infarction, or significant intracranial injury.  No acute calvarial abnormality.  Rounded soft tissue in the maxillary sinuses bilaterally, likely mucous retention cysts or polyps.  Mastoids are clear.  IMPRESSION: No acute intracranial abnormality.  Atrophy, chronic microvascular disease.  CT CERVICAL SPINE  Findings: Advanced degenerative disc disease throughout the cervical spine.  Moderate to advanced degenerative facet disease bilaterally.  Normal alignment.  Severe arthritic changes at the atlantoaxial articulation.  Severe erosions within the odontoid. No acute fracture.  Prevertebral soft tissues are normal.  No epidural or paraspinal hematoma.  Imaging into the apices demonstrates scarring in the upper lobes bilaterally.  Partially imaged is the cavitary lesions seen in the right upper lobe on prior chest radiographs.  IMPRESSION: Severe spondylosis.  No acute bony abnormality.  Original Report Authenticated By: Cyndie Chime, M.D.    Medications:  I have reviewed the patient's current medications. Scheduled:   . sodium chloride   Intravenous Once  . aspirin  325 mg Oral Daily  . budesonide-formoterol  2 puff Inhalation BID  . diltiazem  300 mg Oral Daily  . diltiazem  60 mg Oral Once  . famotidine  20 mg Oral QHS  . levothyroxine  50 mcg Oral QAC breakfast  . multivitamin  1 tablet Oral Daily  . potassium chloride  40 mEq Oral Once  . sodium chloride  1,000 mL Intravenous Once  . sodium chloride  3 mL Intravenous Q12H  . DISCONTD: PreserVision AREDS  1 capsule Oral Daily   Continuous:   . DISCONTD: sodium  chloride 50 mL/hr at 07/05/12 0208  . DISCONTD: 0.9 % NaCl with KCl 20 mEq / L     RUE:[id number], acetaminophen, nitroGLYCERIN, ondansetron (ZOFRAN) IV, ondansetron, senna-docusate  Assessment/Plan: 1) AMS- Since he has new onset Afib, I am concerned that he either has had an embolic CVA or it is related to a low output cardic condition. Despite his family noting fevers, he is afebrile with a benign u/a, CXR without acuity and normal WBC. Blood cultures drawn in ER, will follow fever curve and retest based on symptoms. Neuro consult recommended MRI to ER MD, will order.  2) Afib- Will await MRI result and if negative, consider Pradaxa. Echo will be ordered and will have Cards see him in consult in the morning, as he is associated with [facility]. Added a dose of PO Cardizem in the ER as his wife says that he did not get any of his meds today, which probably is not helping his rate control. He will get his usual 300mg  CD dose with his morning meds.  HR more manageable, in the 90's now.  Cards consult will be obtained today. 3) COPD- Continue inhaler, benign appearing.  4) Hypothyroid- Check TSH, pending, continue replacement, especially due to new Afib.  5)Hyponatremia- IV fluids  6) Hypokalemia- Replace orally today.  7) Hypotension- relative, will use BP meds  mostly for rate control, will keep in bed tonight.   LOS: 1 day   Maximiliano Cromartie W 07/05/2012, 8:31 AM

## 2012-07-05 NOTE — Progress Notes (Signed)
PT Cancellation Note  Treatment cancelled today due to patient receiving procedure or test. Noted MRI ordered, results pending. Will hold PT until MRI results are in. Will follow.  Ralene Bathe Kistler 07/05/2012, 11:49 AM

## 2012-07-05 NOTE — Progress Notes (Signed)
Pt had a 2.43sec pause, pt in no apparent distress. Lying in bed asleep. VS stable, notified MD on call. No new orders given. Will continue to monitor pt.

## 2012-07-05 NOTE — Progress Notes (Signed)
Clinical Social Work Department BRIEF PSYCHOSOCIAL ASSESSMENT 07/05/2012  Patient:  JOSEL, KEO     Account Number:  [id number]     Admit date:  07/04/2012  Clinical Social Worker:  Leron Croak, CLINICAL SOCIAL WORKER  Date/Time:  07/05/2012 05:56 PM  Referred by:  Physician  Date Referred:  07/05/2012 Referred for  SNF Placement   Other Referral:   Interview type:  Patient Other interview type:   Wife was at the bedside.    PSYCHOSOCIAL DATA Living Status:  FACILITY Admitted from facility:  [facility] Level of care:  Independent Living Primary support name:  Christen Bedoya Primary support relationship to patient:  SPOUSE Degree of support available:   excellant    CURRENT CONCERNS Current Concerns  Post-Acute Placement   Other Concerns:    SOCIAL WORK ASSESSMENT / PLAN CSW met with the wife at the bedside. Pt was awake but was non-verbal. Pt's wife was anxious and CSW explained the process and that weekday would follow along with Pt to assess if Pt's nursing level changes.   Assessment/plan status:  Psychosocial Support/Ongoing Assessment of Needs Other assessment/ plan:   Will follow along for PT rec.   Information/referral to community resources:   None at this time    PATIENT'S/FAMILY'S RESPONSE TO PLAN OF CARE: Pt's wife appreciative and was given weekday CSW's number for add. questions      Leron Croak, Judie Grieve Weekend Coverage [phone]

## 2012-07-05 NOTE — Consult Note (Signed)
CARDIOLOGY CONSULT NOTE    Patient ID: [name] MRN: [id number] DOB/AGE: March 02, 1924 78 y.o.  Admit date: 07/04/2012 Referring Physician:  Tisovic Primary Physician: Minda Meo, MD Primary Cardiologist: Herminio Heads Reason for Consultation: Afib  Active Problems:  Mental status change   HPI:  78 yo with distant history of CAD with PCI diagonal by [facility] in 91 and cath 2001 60% LAD.  Normal EF.  History of DVT  Coumadin eventually stopped due to frequent falls.  When he last saw Dr Elberta Fortis 12/12 he was in NSR.  Admitted with fall and likely CVA with rapid afib.  Patient denies chest pain , palpitations or dyspnea. Since admission aphasia markedly improved.  Wife indicates he is just "slow" now.  Noted difficulty speaking after fall  A few days prior to admission.  CT negative for bleed or acute defect.  MRI pending.  Echo also pending.  Afib better rate controlled on cardizem.  On thyroid replacement and TSH normal on admission at 2.6.  CPK's elevated  But negative troponin more consistant with fall or rhabdo and not cardiac origin.    @ROS @ All other systems reviewed and negative except as noted above  Past Medical History  Diagnosis Date  . Allergic rhinitis   . 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 COPD (chronic obstructive pulmonary disease)   . Diverticula of colon   . Right leg DVT 01/2010  . IHD (ischemic heart disease)   . Hyperlipidemia   . Hypertension   . Depression   . Myalgia   . Hypothyroidism   . Bronchiectasis   . Spinal stenosis   . History of heartburn   . Joint pain   . 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 Coronary artery disease     Family History  Problem Relation Age of Onset  . Heart disease Father   . Heart failure Father   . Breast cancer Maternal Aunt     History   Social History  . Marital Status: Married    Spouse Name: N/A    Number of Children: N/A  . Years of Education: N/A    Occupational History  . Not on file.   Social History Main Topics  . Smoking status: Former Smoker -- 0.5 packs/day for 50 years    Types: Cigarettes    Quit date: 11/18/1952  . Smokeless tobacco: Never Used  . Alcohol Use: No  . Drug Use: No  . Sexually Active: Not on file   Other Topics Concern  . Not on file   Social History Narrative  . No narrative on file    Past Surgical History  Procedure Date  . Cardiac catheterization 06/13/2000    normal. ef 60%  . Cardiovascular stress test 04/2010    ef 54%, and normal perfusion  . Coronary angioplasty 1991    1st diagonal  . Carpal tunnel release     right  . Rotator cuff repair     left shoulder  . Knee surgery August  2006    S/P right knee replacement  . Laminectomy     2005  . Cataract extraction     Bilateral         . sodium chloride   Intravenous Once  . aspirin  325 mg Oral Daily  . budesonide-formoterol  2 puff Inhalation BID  . diltiazem  300 mg Oral Daily  . diltiazem  60 mg Oral Once  .  famotidine  20 mg Oral QHS  . levothyroxine  50 mcg Oral QAC breakfast  . multivitamin  1 tablet Oral Daily  . potassium chloride  40 mEq Oral Once  . sodium chloride  1,000 mL Intravenous Once  . sodium chloride  3 mL Intravenous Q12H  . DISCONTD: PreserVision AREDS  1 capsule Oral Daily      . DISCONTD: sodium chloride 50 mL/hr at 07/05/12 0208  . DISCONTD: 0.9 % NaCl with KCl 20 mEq / L      Physical Exam:   Affect appropriate Elderly white male  HEENT: normal Neck supple with no adenopathy JVP normal no bruits no thyromegaly Lungs clear with no wheezing and good diaphragmatic motion Heart:  S1/S2 no murmur, no rub, gallop or click PMI normal Abdomen: benighn, BS positve, no tenderness, no AAA no bruit.  No HSM or HJR Distal pulses intact with no bruits No edema Neuro non-focal aphasia improved  Skin warm and dry No muscular weakness  Labs:   Lab Results  Component Value Date   WBC 7.9  07/05/2012   HGB 11.0* 07/05/2012   HCT 30.1* 07/05/2012   MCV 104.5* 07/05/2012   PLT 184 07/05/2012    Lab 07/05/12 0230  NA 132*  K 3.3*  CL 101  CO2 21  BUN 19  CREATININE 1.15  CALCIUM 7.8*  PROT 6.4  BILITOT 0.5  ALKPHOS 49  ALT 11  AST 34  GLUCOSE 94   Lab Results  Component Value Date   CKTOTAL 1262* 07/05/2012   CKMB 4.4* 07/05/2012   TROPONINI <0.30 07/05/2012    Lab Results  Component Value Date   CHOL  Value: 117        ATP III CLASSIFICATION:  <200     mg/dL   Desirable  161-096  mg/dL   Borderline High  >=045    mg/dL   High        02/24/8118   Lab Results  Component Value Date   HDL 52 05/03/2010   Lab Results  Component Value Date   LDLCALC  Value: 57        Total Cholesterol/HDL:CHD Risk Coronary Heart Disease Risk Table                     Men   Women  1/2 Average Risk   3.4   3.3  Average Risk       5.0   4.4  2 X Average Risk   9.6   7.1  3 X Average Risk  23.4   11.0        Use the calculated Patient Ratio above and the CHD Risk Table to determine the patient's CHD Risk.        ATP III CLASSIFICATION (LDL):  <100     mg/dL   Optimal  147-829  mg/dL   Near or Above                    Optimal  130-159  mg/dL   Borderline  562-130  mg/dL   High  >865     mg/dL   Very High 7/84/6962   Lab Results  Component Value Date   TRIG 38 05/03/2010   Lab Results  Component Value Date   CHOLHDL 2.3 05/03/2010   No results found for this basename: LDLDIRECT      Radiology: Dg Chest 2 View  07/04/2012  *RADIOLOGY REPORT*  Clinical Data: Chest pain, COPD.  CHEST - 2 VIEW  Comparison: 05/01/2012  Findings: Again noted is the cavitary lesion in the right upper lobe.  Previously seen air fluid level not visualized on the current study.  Chronic scarring in both upper lobes.  Heart is upper limits normal in size.  No effusions or acute airspace opacities.  IMPRESSION: Stable chronic upper lobe densities bilaterally compatible with scarring.  Cavitary lesion in the right upper  lobe again noted.  No current air-fluid level as seen previously.  Original Report Authenticated By: Cyndie Chime, M.D.   Ct Head Wo Contrast  07/04/2012  *RADIOLOGY REPORT*  Clinical Data:  Fall, altered mental status.  CT HEAD WITHOUT CONTRAST CT CERVICAL SPINE WITHOUT CONTRAST  Technique:  Multidetector CT imaging of the head and cervical spine was performed following the standard protocol without intravenous contrast.  Multiplanar CT image reconstructions of the cervical spine were also generated.  Comparison:  10/15/2011.  CT cervical spine 12/30/2006  CT HEAD  Findings: There is atrophy and chronic small vessel disease changes.  No acute intracranial abnormality.  Specifically, no hemorrhage, hydrocephalus, mass lesion, acute infarction, or significant intracranial injury.  No acute calvarial abnormality.  Rounded soft tissue in the maxillary sinuses bilaterally, likely mucous retention cysts or polyps.  Mastoids are clear.  IMPRESSION: No acute intracranial abnormality.  Atrophy, chronic microvascular disease.  CT CERVICAL SPINE  Findings: Advanced degenerative disc disease throughout the cervical spine.  Moderate to advanced degenerative facet disease bilaterally.  Normal alignment.  Severe arthritic changes at the atlantoaxial articulation.  Severe erosions within the odontoid. No acute fracture.  Prevertebral soft tissues are normal.  No epidural or paraspinal hematoma.  Imaging into the apices demonstrates scarring in the upper lobes bilaterally.  Partially imaged is the cavitary lesions seen in the right upper lobe on prior chest radiographs.  IMPRESSION: Severe spondylosis.  No acute bony abnormality.  Original Report Authenticated By: Cyndie Chime, M.D.   Ct Cervical Spine Wo Contrast  07/04/2012  *RADIOLOGY REPORT*  Clinical Data:  Fall, altered mental status.  CT HEAD WITHOUT CONTRAST CT CERVICAL SPINE WITHOUT CONTRAST  Technique:  Multidetector CT imaging of the head and cervical spine was  performed following the standard protocol without intravenous contrast.  Multiplanar CT image reconstructions of the cervical spine were also generated.  Comparison:  10/15/2011.  CT cervical spine 12/30/2006  CT HEAD  Findings: There is atrophy and chronic small vessel disease changes.  No acute intracranial abnormality.  Specifically, no hemorrhage, hydrocephalus, mass lesion, acute infarction, or significant intracranial injury.  No acute calvarial abnormality.  Rounded soft tissue in the maxillary sinuses bilaterally, likely mucous retention cysts or polyps.  Mastoids are clear.  IMPRESSION: No acute intracranial abnormality.  Atrophy, chronic microvascular disease.  CT CERVICAL SPINE  Findings: Advanced degenerative disc disease throughout the cervical spine.  Moderate to advanced degenerative facet disease bilaterally.  Normal alignment.  Severe arthritic changes at the atlantoaxial articulation.  Severe erosions within the odontoid. No acute fracture.  Prevertebral soft tissues are normal.  No epidural or paraspinal hematoma.  Imaging into the apices demonstrates scarring in the upper lobes bilaterally.  Partially imaged is the cavitary lesions seen in the right upper lobe on prior chest radiographs.  IMPRESSION: Severe spondylosis.  No acute bony abnormality.  Original Report Authenticated By: Cyndie Chime, M.D.    EKG: Rapid afib with no ischemic changes   ASSESSMENT AND PLAN:  Afib:  Continue rate control with cardizem.  Dr Wetzel Bjornstad note  indicates possibility of Pradaxa.  However his age and frequent falls with coumadin previously D/C due to falling worrisome.  If MRI shows CVA would consider coumadin rather than novel agent as it is reversable.  If novel agent used consider Eliquis with lower rate of intracranial bleeding.  Final decision on anticoagulation choice should be made by neuro or primary service  Will also f/u on echo Continue telemetry CAD: stable with no angina or ischemic changes  on ECG Chol: with CAD and CVA would start statin. Thyroid:  Continue replacement  Signed: Charlton Haws 07/05/2012, 11:30 AM

## 2012-07-05 NOTE — Progress Notes (Signed)
*  PRELIMINARY RESULTS* Echocardiogram 2D Echocardiogram has been performed.  [name] 07/05/2012, 11:14 AM

## 2012-07-06 ENCOUNTER — Inpatient Hospital Stay (HOSPITAL_COMMUNITY): Payer: Medicare Other

## 2012-07-06 LAB — BASIC METABOLIC PANEL
BUN: 17 mg/dL (ref 6–23)
CO2: 21 mEq/L (ref 19–32)
GFR calc non Af Amer: 58 mL/min — ABNORMAL LOW (ref >90)
Glucose, Bld: 104 mg/dL — ABNORMAL HIGH (ref 70–99)
Potassium: 3.7 mEq/L (ref 3.5–5.1)
Sodium: 128 mEq/L — ABNORMAL LOW (ref 135–145)

## 2012-07-06 LAB — URINE CULTURE
Colony Count: NO GROWTH
Culture: NO GROWTH

## 2012-07-06 LAB — LIPID PANEL
Cholesterol: 151 mg/dL (ref 0–200)
Total CHOL/HDL Ratio: 3.4 RATIO

## 2012-07-06 LAB — CBC
MCH: 33.5 pg (ref 26.0–34.0)
Platelets: 145 10*3/uL — ABNORMAL LOW (ref 150–400)
RBC: 3.13 MIL/uL — ABNORMAL LOW (ref 4.22–5.81)

## 2012-07-06 NOTE — Progress Notes (Signed)
   CARE MANAGEMENT NOTE 07/06/2012  Patient:  [name], [name]   Account Number:  [id number]  Date Initiated:  07/06/2012  Documentation initiated by:  Jiles Crocker  Subjective/Objective Assessment:   ADMITTED WITH ALTERED MENTAL STATUS, ATRIAL FIB WITH RVR,     Action/Plan:   PCP: Minda Meo, MD  PATIENT RESIDES IN AN ASSISTED LIVING FACILITY; SOC WORKER REFERRAL PLACED   Anticipated DC Date:  07/13/2012   Anticipated DC Plan:  ASSISTED LIVING / REST HOME  In-house referral  Clinical Social Worker             Status of service:  In process, will continue to follow Medicare Important Message given?  NA - LOS <3 / Initial given by admissions (If response is "NO", the following Medicare IM given date fields will be blank)  Per UR Regulation:  Reviewed for med. necessity/level of care/duration of stay  Comments:  07/06/2012- B Tate Jerkins RN, BSN, MHA

## 2012-07-06 NOTE — Progress Notes (Signed)
Pt has a 2.56 second pause at 2000. I was in the pt's room at the time changing his linens and he was asymptomatic. MD was not paged, MD is aware this is happening. Pt is now resting and sleeping comfortably.

## 2012-07-06 NOTE — Evaluation (Signed)
Physical Therapy Evaluation Patient Details Name: [name] MRN: [id number] DOB: 1924-01-08 Today's Date: 07/06/2012 Time: 4098-1191 PT Time Calculation (min): 26 min  PT Assessment / Plan / Recommendation Clinical Impression  Pt. was admitted after a fall. Found to have afib. Pt. will benefit from PT to improve w/ functional mobility and safety to DC to next venue.    PT Assessment  Patient needs continued PT services    Follow Up Recommendations  Skilled nursing facility    Barriers to Discharge        Equipment Recommendations  Rolling walker with 5" wheels;Other (comment);Toilet rise with handles (pt. has a borrowed , May want to get a 4 wheeled.)    Recommendations for Other Services OT consult   Frequency Min 3X/week    Precautions / Restrictions Precautions Precautions: Fall   Pertinent Vitals/Pain C/o L side pain, not rated.      Mobility  Bed Mobility Bed Mobility: Supine to Sit;Sitting - Scoot to Edge of Bed Supine to Sit: 3: Mod assist;HOB elevated;With rails Sitting - Scoot to Edge of Bed: 3: Mod assist Details for Bed Mobility Assistance: HOB raised to asssit pt. moving to edge, and to right trunk. Transfers Transfers: Sit to Stand;Stand to Sit Sit to Stand: 4: Min assist;From bed;From elevated surface;With upper extremity assist Stand to Sit: 4: Min assist;To chair/3-in-1;With upper extremity assist Details for Transfer Assistance: Pt. moves guardedly and slowly due to pain Ambulation/Gait Ambulation/Gait Assistance: 1: +2 Total assist (+1 for safety and follow  w/ chair.) Ambulation/Gait: Patient Percentage: 80% Ambulation Distance (Feet): 150 Feet Assistive device: Rolling walker Ambulation/Gait Assistance Details: Pt. able to guide RW, gait is steady w/ RW. Gait Pattern: Step-through pattern;Decreased step length - left Gait velocity: decreased    Exercises     PT Diagnosis: Difficulty walking;Acute pain  PT Problem List: Decreased  strength;Decreased range of motion;Decreased activity tolerance;Decreased mobility;Pain;Decreased knowledge of use of DME PT Treatment Interventions: DME instruction;Gait training;Functional mobility training;Therapeutic activities;Patient/family education   PT Goals Acute Rehab PT Goals PT Goal Formulation: With patient/family Time For Goal Achievement: 07/20/12 Potential to Achieve Goals: Good Pt will go Supine/Side to Sit: with supervision;with HOB 0 degrees PT Goal: Supine/Side to Sit - Progress: Goal set today Pt will go Sit to Supine/Side: with supervision;with HOB 0 degrees PT Goal: Sit to Supine/Side - Progress: Goal set today Pt will go Sit to Stand: with supervision PT Goal: Sit to Stand - Progress: Goal set today Pt will go Stand to Sit: with supervision PT Goal: Stand to Sit - Progress: Goal set today Pt will Ambulate: >150 feet;with supervision;with rolling walker PT Goal: Ambulate - Progress: Goal set today  Visit Information  Last PT Received On: 07/06/12 Assistance Needed: +1    Subjective Data  Subjective: My side hurts Patient Stated Goal: I want to walk   Prior Functioning  Home Living Lives With: Spouse Available Help at Discharge: Family Type of Home: Apartment Home Access: Elevator Home Layout: One level Bathroom Shower/Tub: Health visitor: Handicapped height Bathroom Accessibility: Yes Home Adaptive Equipment: Environmental consultant - rolling Prior Function Level of Independence: Independent Driving: Yes Vocation: Retired Musician: No difficulties    Cognition  Overall Cognitive Status: Appears within functional limits for tasks assessed/performed Arousal/Alertness: Awake/alert Orientation Level: Appears intact for tasks assessed Behavior During Session: [facility] for tasks performed    Extremity/Trunk Assessment Right Upper Extremity Assessment RUE ROM/Strength/Tone: [facility] for tasks assessed RUE Sensation: WFL - Light Touch Left  Upper Extremity  Assessment LUE ROM/Strength/Tone: Deficits;Due to pain LUE ROM/Strength/Tone Deficits: difficulty lifting LUE to RW LUE Sensation: WFL - Light Touch Right Lower Extremity Assessment RLE ROM/Strength/Tone: WFL for tasks assessed RLE Sensation: WFL - Light Touch Left Lower Extremity Assessment LLE ROM/Strength/Tone: WFL for tasks assessed LLE Sensation: WFL - Light Touch Trunk Assessment Trunk Assessment: Kyphotic   Balance Static Sitting Balance Static Sitting - Balance Support: Bilateral upper extremity supported Static Sitting - Level of Assistance: 5: Stand by assistance  End of Session PT - End of Session Activity Tolerance: Patient tolerated treatment well Patient left: in chair;with call bell/phone within reach;with family/visitor present Nurse Communication: Mobility status  GP     Rada Hay 07/06/2012, 12:40 PM  [phone]

## 2012-07-06 NOTE — Progress Notes (Signed)
Patient has a bed at [facility] when medically stable. Anticipating discharge Tuesday.   *FL2 on shadow chart for MD signature   Unice Bailey, LCSW [facility] Clinical Social Worker cell #: [phone]

## 2012-07-06 NOTE — Progress Notes (Signed)
SUBJECTIVE:  Awake, alert. No chest pain.  No SOB.   PHYSICAL EXAM Filed Vitals:   07/05/12 1404 07/05/12 2032 07/05/12 2300 07/06/12 0530  BP: 104/71  106/65 109/63  Pulse: 82  66 61  Temp: 98.3 F (36.8 C)  98.7 F (37.1 C) 99.6 F (37.6 C)  TempSrc: Oral  Oral Oral  Resp: 20  20 20   Height:      Weight:      SpO2: 96% 96% 96% 95%   General:  No distress,  Frail. Lungs:  Clear Heart:  Irregular Abdomen:  Positive bowel sounds, no rebound no guarding Extremities:  No edema  LABS: Lab Results  Component Value Date   CKTOTAL 1091* 07/05/2012   CKMB 4.0 07/05/2012   TROPONINI <0.30 07/05/2012   Results for orders placed during the hospital encounter of 07/04/12 (from the past 24 hour(s))  CARDIAC PANEL(CRET KIN+CKTOT+MB+TROPI)     Status: Abnormal   Collection Time   07/05/12  9:19 AM      Component Value Range   Total CK 1262 (*) 7 - 232 U/L   CK, MB 4.4 (*) 0.3 - 4.0 ng/mL   Troponin I <0.30  <0.30 ng/mL   Relative Index 0.3  0.0 - 2.5  CARDIAC PANEL(CRET KIN+CKTOT+MB+TROPI)     Status: Abnormal   Collection Time   07/05/12  5:09 PM      Component Value Range   Total CK 1091 (*) 7 - 232 U/L   CK, MB 4.0  0.3 - 4.0 ng/mL   Troponin I <0.30  <0.30 ng/mL   Relative Index 0.4  0.0 - 2.5  BASIC METABOLIC PANEL     Status: Abnormal   Collection Time   07/06/12  4:15 AM      Component Value Range   Sodium 128 (*) 135 - 145 mEq/L   Potassium 3.7  3.5 - 5.1 mEq/L   Chloride 98  96 - 112 mEq/L   CO2 21  19 - 32 mEq/L   Glucose, Bld 104 (*) 70 - 99 mg/dL   BUN 17  6 - 23 mg/dL   Creatinine, Ser 1.61  0.50 - 1.35 mg/dL   Calcium 7.6 (*) 8.4 - 10.5 mg/dL   GFR calc non Af Amer 58 (*) >90 mL/min   GFR calc Af Amer 67 (*) >90 mL/min  CBC     Status: Abnormal   Collection Time   07/06/12  4:15 AM      Component Value Range   WBC 8.3  4.0 - 10.5 K/uL   RBC 3.13 (*) 4.22 - 5.81 MIL/uL   Hemoglobin 10.5 (*) 13.0 - 17.0 g/dL   HCT 09.6 (*) 04.5 - 40.9 %   MCV 97.8  78.0  - 100.0 fL   MCH 33.5  26.0 - 34.0 pg   MCHC 34.3  30.0 - 36.0 g/dL   RDW 81.1  91.4 - 78.2 %   Platelets 145 (*) 150 - 400 K/uL  LIPID PANEL     Status: Normal   Collection Time   07/06/12  4:15 AM      Component Value Range   Cholesterol 151  0 - 200 mg/dL   Triglycerides 70  <956 mg/dL   HDL 44  >21 mg/dL   Total CHOL/HDL Ratio 3.4     VLDL 14  0 - 40 mg/dL   LDL Cholesterol 93  0 - 99 mg/dL    Intake/Output Summary (Last 24 hours) at 07/06/12 3086  Last data filed at 07/06/12 0700  Gross per 24 hour  Intake   1220 ml  Output    625 ml  Net    595 ml   ECHO:  - Left ventricle: The cavity size was normal. Wall thickness was increased in a pattern of mild LVH. Systolic function was mildly reduced. The estimated ejection fraction was in the range of 45% to 50%. Regional wall motion abnormalities cannot be excluded. - Ventricular septum: Septal motion showed paradox. - Aortic valve: Mild to moderate regurgitation. - Mitral valve: Mild regurgitation.    ASSESSMENT AND PLAN:  Atrial fibrillation:  Atrial fibrillation.  He does have some short post termination pauses when he briefly goes into sinus rhythm.  However, for the most part he is in fib with good rate control.  As in Dr. Fabio Bering note we would suggest treatment with warfarin.  (Eliquis would be reasonable with the low risk of CNS bleeding particularly in the elderly.  However, the cost is usually prohibitive.)  CAD:  No active ischemic issues.  I do note that the EF is slightly low compared with previous.  However, he seems to be euvolemic.  No change to therapy is indicated.     Fayrene Fearing [facility] 07/06/2012 8:42 AM

## 2012-07-06 NOTE — Progress Notes (Signed)
Subjective: Shouldn't is awake alert wife at the bedside. Lengthy discussion regarding events and potential anticoagulation need versus risk of fall and bleeding.  Objective: Vital signs in last 24 hours: Temp:  [98.3 F (36.8 C)-98.7 F (37.1 C)] 98.7 F (37.1 C) (08/18 2300) Pulse Rate:  [66-82] 66  (08/18 2300) Resp:  [20] 20  (08/18 2300) BP: (104-106)/(65-71) 106/65 mmHg (08/18 2300) SpO2:  [94 %-96 %] 96 % (08/18 2300) Weight change:   CBG (last 3)  No results found for this basename: GLUCAP:3 in the last 72 hours  Intake/Output from previous day: 08/18 0701 - 08/19 0700 In: 1220 [P.O.:720; I.V.:500] Out: 50 [Urine:50]  Physical Exam: Patient is awake alert conversant appropriate at baseline mentally. Good facial symmetry no droop. Neck senile changes. Cardiovascular regular rate and rhythm no obvious murmur. Abdomen soft and nontender. Extremities without edema intact distal pulses. Back with fairly severe kyphosis no focal tenderness. Neurologically awake alert higher cortical functioning intact. Cranial nerves intact. Moves extremities x4 but diffuse weakness. Gait not assessed.   Lab Results:  Basename 07/06/12 0415 07/05/12 0230 07/04/12 1915  NA 128* 132* --  K 3.7 3.3* --  CL 98 101 --  CO2 21 21 --  GLUCOSE 104* 94 --  BUN 17 19 --  CREATININE 1.12 1.15 --  CALCIUM 7.6* 7.8* --  MG -- -- 2.1  PHOS -- -- 2.7    Basename 07/05/12 0230  AST 34  ALT 11  ALKPHOS 49  BILITOT 0.5  PROT 6.4  ALBUMIN 2.7*    Basename 07/06/12 0415 07/05/12 0230 07/04/12 1915  WBC 8.3 7.9 --  NEUTROABS -- -- 6.5  HGB 10.5* 11.0* --  HCT 30.6* 30.1* --  MCV 97.8 104.5* --  PLT 145* 184 --   Lab Results  Component Value Date   INR 1.10 07/04/2012   INR 2.19* 05/03/2010   INR 2.29* 05/02/2010    Basename 07/05/12 1709 07/05/12 0919 07/04/12 1915  CKTOTAL 1091* 1262* --  CKMB 4.0 4.4* --  CKMBINDEX -- -- --  TROPONINI <0.30 <0.30 <0.30    Basename 07/05/12 0230    TSH 2.652  T4TOTAL --  T3FREE --  THYROIDAB --   No results found for this basename: VITAMINB12:2,FOLATE:2,FERRITIN:2,TIBC:2,IRON:2,RETICCTPCT:2 in the last 72 hours  Studies/Results: Dg Chest 2 View  07/04/2012  *RADIOLOGY REPORT*  Clinical Data: Chest pain, COPD.  CHEST - 2 VIEW  Comparison: 05/01/2012  Findings: Again noted is the cavitary lesion in the right upper lobe.  Previously seen air fluid level not visualized on the current study.  Chronic scarring in both upper lobes.  Heart is upper limits normal in size.  No effusions or acute airspace opacities.  IMPRESSION: Stable chronic upper lobe densities bilaterally compatible with scarring.  Cavitary lesion in the right upper lobe again noted.  No current air-fluid level as seen previously.  Original Report Authenticated By: Cyndie Chime, M.D.   Ct Head Wo Contrast  07/04/2012  *RADIOLOGY REPORT*  Clinical Data:  Fall, altered mental status.  CT HEAD WITHOUT CONTRAST CT CERVICAL SPINE WITHOUT CONTRAST  Technique:  Multidetector CT imaging of the head and cervical spine was performed following the standard protocol without intravenous contrast.  Multiplanar CT image reconstructions of the cervical spine were also generated.  Comparison:  10/15/2011.  CT cervical spine 12/30/2006  CT HEAD  Findings: There is atrophy and chronic small vessel disease changes.  No acute intracranial abnormality.  Specifically, no hemorrhage, hydrocephalus, mass lesion, acute infarction,  or significant intracranial injury.  No acute calvarial abnormality.  Rounded soft tissue in the maxillary sinuses bilaterally, likely mucous retention cysts or polyps.  Mastoids are clear.  IMPRESSION: No acute intracranial abnormality.  Atrophy, chronic microvascular disease.  CT CERVICAL SPINE  Findings: Advanced degenerative disc disease throughout the cervical spine.  Moderate to advanced degenerative facet disease bilaterally.  Normal alignment.  Severe arthritic changes at the  atlantoaxial articulation.  Severe erosions within the odontoid. No acute fracture.  Prevertebral soft tissues are normal.  No epidural or paraspinal hematoma.  Imaging into the apices demonstrates scarring in the upper lobes bilaterally.  Partially imaged is the cavitary lesions seen in the right upper lobe on prior chest radiographs.  IMPRESSION: Severe spondylosis.  No acute bony abnormality.  Original Report Authenticated By: Cyndie Chime, M.D.   Ct Cervical Spine Wo Contrast  07/04/2012  *RADIOLOGY REPORT*  Clinical Data:  Fall, altered mental status.  CT HEAD WITHOUT CONTRAST CT CERVICAL SPINE WITHOUT CONTRAST  Technique:  Multidetector CT imaging of the head and cervical spine was performed following the standard protocol without intravenous contrast.  Multiplanar CT image reconstructions of the cervical spine were also generated.  Comparison:  10/15/2011.  CT cervical spine 12/30/2006  CT HEAD  Findings: There is atrophy and chronic small vessel disease changes.  No acute intracranial abnormality.  Specifically, no hemorrhage, hydrocephalus, mass lesion, acute infarction, or significant intracranial injury.  No acute calvarial abnormality.  Rounded soft tissue in the maxillary sinuses bilaterally, likely mucous retention cysts or polyps.  Mastoids are clear.  IMPRESSION: No acute intracranial abnormality.  Atrophy, chronic microvascular disease.  CT CERVICAL SPINE  Findings: Advanced degenerative disc disease throughout the cervical spine.  Moderate to advanced degenerative facet disease bilaterally.  Normal alignment.  Severe arthritic changes at the atlantoaxial articulation.  Severe erosions within the odontoid. No acute fracture.  Prevertebral soft tissues are normal.  No epidural or paraspinal hematoma.  Imaging into the apices demonstrates scarring in the upper lobes bilaterally.  Partially imaged is the cavitary lesions seen in the right upper lobe on prior chest radiographs.  IMPRESSION: Severe  spondylosis.  No acute bony abnormality.  Original Report Authenticated By: Cyndie Chime, M.D.     Assessment/Plan: #1 atrial fibrillation rapid ventricular response currently controlled rate in the 60-70 range with stable blood pressure  #2 altered mental status cleared unable to perform MRI because of positioning and senile changes involving the neck no deficits at this time neurologically  #3 coronary artery disease with remote PCI  #4 COPD stable at baseline  #5 primary hypothyroidism stable  #6 hyponatremia felt not to be causative in the events that unfolded  Plan we'll initiate PT and OT, obtain thoracic and lumbar spine films following his fall and continued pain, probably with hold any anticoagulation given her propensity for falls knowing the bleeding complications discussed with wife and look for a bed at [facility].   LOS: 2 days   Mayur Duman A 07/06/2012, 7:21 AM

## 2012-07-07 LAB — CBC
HCT: 28.3 % — ABNORMAL LOW (ref 39.0–52.0)
Hemoglobin: 9.8 g/dL — ABNORMAL LOW (ref 13.0–17.0)
MCH: 33.2 pg (ref 26.0–34.0)
MCHC: 34.6 g/dL (ref 30.0–36.0)
MCV: 95.9 fL (ref 78.0–100.0)

## 2012-07-07 LAB — BASIC METABOLIC PANEL
BUN: 12 mg/dL (ref 6–23)
Chloride: 98 mEq/L (ref 96–112)
GFR calc non Af Amer: 56 mL/min — ABNORMAL LOW (ref >90)
Glucose, Bld: 99 mg/dL (ref 70–99)
Potassium: 3.9 mEq/L (ref 3.5–5.1)

## 2012-07-07 NOTE — Progress Notes (Signed)
Patient is set to discharge to [facility] today. Wife & son at bedside aware. [facility] called for 2:45 pickup.   Unice Bailey, LCSW [facility] Clinical Social Worker cell #: [phone]

## 2012-07-07 NOTE — Plan of Care (Signed)
Problem: Phase I Progression Outcomes Goal: Anticoagulation Therapy per MD order Outcome: Completed/Met Date Met:  07/07/12 Ted hose, no blood thinners d/t high risk for fall

## 2012-07-07 NOTE — Discharge Summary (Signed)
DISCHARGE SUMMARY  MIKLOS BIDINGER  MR#: [id number]  DOB:11-Mar-1924  Date of Admission: 07/04/2012 Date of Discharge: 07/07/2012  Attending Physician:Copelan Maultsby [name]  Patient's [name],[name] A, MD  Consults:Treatment Team:  Kym Groom, MD [facility], MD  Discharge Diagnoses: Active Problems:  Mental status change- probable TIA with expressive aphasia resolved unable to perform MRI because of cervical spine  [name]-fib- controlled ventricular rate with contraindications to quite elation given propensity for falls Coronary artery disease with remote PCI COPD stable at baseline Chronic hyponatremia baseline sodium 120-132 Osteoarthritis and lumbar spondylosis Essential hypertension   Discharge Medications: Medication List  As of 07/07/2012 11:34 AM   TAKE these medications         acetaminophen 500 MG tablet   Commonly known as: TYLENOL   Take 500 mg by mouth every 6 (six) hours as needed. pain      aspirin 325 MG tablet   Take 325 mg by mouth daily.      budesonide-formoterol 160-4.5 MCG/ACT inhaler   Commonly known as: SYMBICORT   Inhale 2 puffs into the lungs 2 (two) times daily.      diltiazem 300 MG 24 hr capsule   Commonly known as: CARDIZEM CD   Take 300 mg by mouth daily.      famotidine 20 MG tablet   Commonly known as: PEPCID   Take 20 mg by mouth at bedtime.      fluticasone 50 MCG/ACT nasal spray   Commonly known as: FLONASE   Place 2 sprays into the nose 2 (two) times daily.      levothyroxine 50 MCG tablet   Commonly known as: SYNTHROID, LEVOTHROID   Take 50 mcg by mouth daily.      meloxicam 7.5 MG tablet   Commonly known as: MOBIC   Take 7.5 mg by mouth 2 (two) times daily.      nitroGLYCERIN 0.4 MG SL tablet   Commonly known as: NITROSTAT   Place 0.4 mg under the tongue every 5 (five) minutes as needed. May repeat x3      PreserVision AREDS Caps   Take 1 capsule by mouth daily.            Hospital Procedures: Dg  Chest 2 View  07/04/2012  *RADIOLOGY REPORT*  Clinical Data: Chest pain, COPD.  CHEST - 2 VIEW  Comparison: 05/01/2012  Findings: Again noted is the cavitary lesion in the right upper lobe.  Previously seen air fluid level not visualized on the current study.  Chronic scarring in both upper lobes.  Heart is upper limits normal in size.  No effusions or acute airspace opacities.  IMPRESSION: Stable chronic upper lobe densities bilaterally compatible with scarring.  Cavitary lesion in the right upper lobe again noted.  No current air-fluid level as seen previously.  Original Report Authenticated By: Cyndie Chime, M.D.   Dg Thoracic Spine 2 View  07/06/2012  *RADIOLOGY REPORT*  Clinical Data: Fall with left sided pain.  THORACIC SPINE - 2 VIEW  Comparison: Chest radiograph 07/04/2012 and 10/01/2011.  Findings: Upper thoracic compression fracture and mild loss of vertebral body height in the mid thoracic spine are unchanged from 10/01/2011.  Alignment is anatomic.  Multilevel endplate degenerative changes are noted.  Incidental imaging of the lumbar spine shows grade 1 anterolisthesis of L3 on L4, stable. Degenerative changes in the lumbar spine are noted.  IMPRESSION:  1.  Thoracic spondylosis with stable compression fractures in the upper and mid thoracic spine. 2.  Lumbar  spondylosis, better evaluated on dedicated imaging performed the same day.   Original Report Authenticated By: Reyes Ivan, M.D. ( 07/06/2012 08:31:44 )    Dg Lumbar Spine 2-3 Views  07/06/2012  *RADIOLOGY REPORT*  Clinical Data: Fall with left sided pain.  LUMBAR SPINE - 2-3 VIEW  Comparison: 10/13/2007.  Findings: On the lateral view, the lumbar spine is visualized from L2-S1.  Approximately 6 mm of anterolisthesis of L3 and L4, stable. Facet hypertrophy at multiple levels.  Endplate degenerative changes and loss of disc space height are worst at L5-S1. Postoperative changes are seen in the spine.  Atherosclerotic calcification of  the arterial vasculature.  IMPRESSION:  1.  Spondylosis, worst at L5-S1. 2.  Stable grade 1 anterolisthesis at L3-4.   Original Report Authenticated By: Reyes Ivan, M.D. ( 07/06/2012 16:10:96 )    Ct Head Wo Contrast  07/04/2012  *RADIOLOGY REPORT*  Clinical Data:  Fall, altered mental status.  CT HEAD WITHOUT CONTRAST CT CERVICAL SPINE WITHOUT CONTRAST  Technique:  Multidetector CT imaging of the head and cervical spine was performed following the standard protocol without intravenous contrast.  Multiplanar CT image reconstructions of the cervical spine were also generated.  Comparison:  10/15/2011.  CT cervical spine 12/30/2006  CT HEAD  Findings: There is atrophy and chronic small vessel disease changes.  No acute intracranial abnormality.  Specifically, no hemorrhage, hydrocephalus, mass lesion, acute infarction, or significant intracranial injury.  No acute calvarial abnormality.  Rounded soft tissue in the maxillary sinuses bilaterally, likely mucous retention cysts or polyps.  Mastoids are clear.  IMPRESSION: No acute intracranial abnormality.  Atrophy, chronic microvascular disease.  CT CERVICAL SPINE  Findings: Advanced degenerative disc disease throughout the cervical spine.  Moderate to advanced degenerative facet disease bilaterally.  Normal alignment.  Severe arthritic changes at the atlantoaxial articulation.  Severe erosions within the odontoid. No acute fracture.  Prevertebral soft tissues are normal.  No epidural or paraspinal hematoma.  Imaging into the apices demonstrates scarring in the upper lobes bilaterally.  Partially imaged is the cavitary lesions seen in the right upper lobe on prior chest radiographs.  IMPRESSION: Severe spondylosis.  No acute bony abnormality.  Original Report Authenticated By: Cyndie Chime, M.D.   Ct Cervical Spine Wo Contrast  07/04/2012  *RADIOLOGY REPORT*  Clinical Data:  Fall, altered mental status.  CT HEAD WITHOUT CONTRAST CT CERVICAL SPINE WITHOUT  CONTRAST  Technique:  Multidetector CT imaging of the head and cervical spine was performed following the standard protocol without intravenous contrast.  Multiplanar CT image reconstructions of the cervical spine were also generated.  Comparison:  10/15/2011.  CT cervical spine 12/30/2006  CT HEAD  Findings: There is atrophy and chronic small vessel disease changes.  No acute intracranial abnormality.  Specifically, no hemorrhage, hydrocephalus, mass lesion, acute infarction, or significant intracranial injury.  No acute calvarial abnormality.  Rounded soft tissue in the maxillary sinuses bilaterally, likely mucous retention cysts or polyps.  Mastoids are clear.  IMPRESSION: No acute intracranial abnormality.  Atrophy, chronic microvascular disease.  CT CERVICAL SPINE  Findings: Advanced degenerative disc disease throughout the cervical spine.  Moderate to advanced degenerative facet disease bilaterally.  Normal alignment.  Severe arthritic changes at the atlantoaxial articulation.  Severe erosions within the odontoid. No acute fracture.  Prevertebral soft tissues are normal.  No epidural or paraspinal hematoma.  Imaging into the apices demonstrates scarring in the upper lobes bilaterally.  Partially imaged is the cavitary lesions seen in the right  upper lobe on prior chest radiographs.  IMPRESSION: Severe spondylosis.  No acute bony abnormality.  Original Report Authenticated By: Cyndie Chime, M.D.    History of Present Illness: Fall and altered mental status.Patient is an 78 year old male, seen last in our office at [facility] by our NP as [name] work in on the 9th for L sided facial pain with chewing, treated conservatively.  He had [name] fall this morning at about 11am and according to his wife has had some difficulty expressing himself ever since. He got up late and she really did not see him before the incident, but he was his usual self last night. She fed him breakfast after the fall without  difficulty.  He was brought to the ER where he has had [name] workup consisting of [name] normal CT head and neck, negative U/[name] and [name] slightly low Na and slightly high K+. Of note however, his EKG shows Afib + RVR, which by record is new for him. He was previously on Coumadin for DVT in the past and has been followed at [facility] and is treated for CAD medically   Hospital Course: Patient was admitted to the medical service on telemetry. His altered mental status may indeed have been transient expressive aphasia from [name] TIA. This remains unclear as he was not [name] candidate for MRI he was unable the B. position because of the cervical spine which is chronically deformed because of chronic changes and initial CT was negative. Of note, he was in atrial fibrillation with rapid ventricular response but this quickly came under control flexing in and out of [name]. fib all of which controlled in the 60s. He does [name] remote cardiac history with coronary artery disease remote PTCA and was seen by the cardiology service. 2-3 cardiologists that did see him agreed with myself that he was probably [name] poor risk of anticoagulation because of fall risk. I did explain the risk of stroke versus bleeding from anticoagulation and fall risk and wife is in agreement with aspirin alone at this point. He does remain weak and debilitated after the event as prior to this he was ambulating. Evaluation of the cervical, thoracic and lumbar spine reveal [name] chronic spondylitic changes and old thoracic compression fractures but nothing new. Physical therapy was consulted and he is beginning to mobilize but clearly not at baseline. He is eating well and mentally back to baseline with normal speech pattern. [facility] is [facility] for 2-3 weeks of rehabilitation efforts. Wife and he both understand that he is at risk for further events but nothing more aggressive to be done at this point.  Day of Discharge Exam BP 113/79  Pulse 123  Temp  99.2 F (37.3 C) (Oral)  Resp 20  Ht 6' (1.829 m)  Wt 80.9 kg (178 lb 5.6 oz)  BMI 24.19 kg/m2  SpO2 94%  Physical Exam: General appearance: alert and cooperative Eyes: no scleral icterus Throat: oropharynx moist without erythema Resp: clear to auscultation bilaterally Cardio: regular rate and rhythm Extremities: no clubbing, cyanosis or edema Back does reveal [name] severe kyphoscoliosis no focal tenderness or contusions Neurologically he is awake alert [name] bit hard of hearing conversing good facial symmetry nonlateralizing  Discharge Labs:  [facility] 07/07/12 0445 07/06/12 0415 07/04/12 1915  NA 129* 128* --  K 3.9 3.7 --  CL 98 98 --  CO2 21 21 --  GLUCOSE 99 104* --  BUN 12 17 --  CREATININE 1.15  1.12 --  CALCIUM 8.0* 7.6* --  MG -- -- 2.1  PHOS -- -- 2.7    Basename 07/05/12 0230  AST 34  ALT 11  ALKPHOS 49  BILITOT 0.5  PROT 6.4  ALBUMIN 2.7*    Basename 07/07/12 0445 07/06/12 0415 07/04/12 1915  WBC 8.8 8.3 --  NEUTROABS -- -- 6.5  HGB 9.8* 10.5* --  HCT 28.3* 30.6* --  MCV 95.9 97.8 --  PLT 183 145* --    Basename 07/05/12 1709 07/05/12 0919 07/04/12 1915  CKTOTAL 1091* 1262* --  CKMB 4.0 4.4* --  CKMBINDEX -- -- --  TROPONINI <0.30 <0.30 <0.30    Basename 07/05/12 0230  TSH 2.652  T4TOTAL --  T3FREE --  THYROIDAB --   No results found for this basename: VITAMINB12:2,FOLATE:2,FERRITIN:2,TIBC:2,IRON:2,RETICCTPCT:2 in the last 72 hours  Discharge instructions: Discharge Orders    Future Appointments: Provider: Department: Dept Phone: Center:   07/30/2012 3:15 PM Kathleene Hazel, MD [facility] [phone] [facility]   08/18/2012 2:00 PM Nyoka Cowden, MD [facility] [phone] None   10/02/2012 9:00 AM Delcie Roch [facility] [phone] None   10/02/2012 9:30 AM Laurice Record, MD [facility] [phone] None     Future Orders Please Complete By Expires   Diet - low sodium heart healthy       Increase activity slowly         Disposition: [facility]  Follow-up Appts: Follow-up with Dr. Jacky Kindle at [facility] in following discharge from skilled back to independent living.  Call for appointment.  Condition on Discharge: Improved and stable condition with but with [name] guarded prognosis  Tests Needing Follow-up: Physical and occupational therapy  Signed: Jaquisha Frech [name] 07/07/2012, 11:34 AM

## 2012-07-07 NOTE — Progress Notes (Signed)
    SUBJECTIVE:  Awake, alert. No chest pain.  No SOB.  He apparently had some delerium last night.   PHYSICAL EXAM Filed Vitals:   07/06/12 1105 07/06/12 1334 07/06/12 2101 07/07/12 0533  BP:  100/64 121/65 113/79  Pulse:  64 62 123  Temp:  98.3 F (36.8 C) 98.2 F (36.8 C) 99.2 F (37.3 C)  TempSrc:  Oral Oral Oral  Resp:  20 19 20   Height:      Weight:      SpO2: 95% 96% 96% 94%   General:  No distress,  Frail. Lungs:  Clear Heart:  Irregular Abdomen:  Positive bowel sounds, no rebound no guarding Extremities:  No edema  LABS: Lab Results  Component Value Date   CKTOTAL 1091* 07/05/2012   CKMB 4.0 07/05/2012   TROPONINI <0.30 07/05/2012   Results for orders placed during the hospital encounter of 07/04/12 (from the past 24 hour(s))  BASIC METABOLIC PANEL     Status: Abnormal   Collection Time   07/07/12  4:45 AM      Component Value Range   Sodium 129 (*) 135 - 145 mEq/L   Potassium 3.9  3.5 - 5.1 mEq/L   Chloride 98  96 - 112 mEq/L   CO2 21  19 - 32 mEq/L   Glucose, Bld 99  70 - 99 mg/dL   BUN 12  6 - 23 mg/dL   Creatinine, Ser 4.54  0.50 - 1.35 mg/dL   Calcium 8.0 (*) 8.4 - 10.5 mg/dL   GFR calc non Af Amer 56 (*) >90 mL/min   GFR calc Af Amer 65 (*) >90 mL/min  CBC     Status: Abnormal   Collection Time   07/07/12  4:45 AM      Component Value Range   WBC 8.8  4.0 - 10.5 K/uL   RBC 2.95 (*) 4.22 - 5.81 MIL/uL   Hemoglobin 9.8 (*) 13.0 - 17.0 g/dL   HCT 09.8 (*) 11.9 - 14.7 %   MCV 95.9  78.0 - 100.0 fL   MCH 33.2  26.0 - 34.0 pg   MCHC 34.6  30.0 - 36.0 g/dL   RDW 82.9  56.2 - 13.0 %   Platelets 183  150 - 400 K/uL    Intake/Output Summary (Last 24 hours) at 07/07/12 8657 Last data filed at 07/07/12 0535  Gross per 24 hour  Intake    600 ml  Output   3150 ml  Net  -2550 ml     ASSESSMENT AND PLAN:  Atrial fibrillation:  No pauses overnight.  Telemetry reviewed.  I note Dr. Lanell Matar plan for no anticoagulation secondary to fall risk.  He has  been in sinus rhythm for long periods during the day.  No change in therapy.    CAD:  No active ischemic issues.  Continue current medications.  Our office will arrange cardiology follow up.      Fayrene Fearing [facility] 07/07/2012 6:24 AM

## 2012-07-08 DIAGNOSIS — E871 Hypo-osmolality and hyponatremia: Secondary | ICD-10-CM

## 2012-07-08 DIAGNOSIS — F329 Major depressive disorder, single episode, unspecified: Secondary | ICD-10-CM

## 2012-07-08 DIAGNOSIS — R079 Chest pain, unspecified: Secondary | ICD-10-CM

## 2012-07-08 DIAGNOSIS — D62 Acute posthemorrhagic anemia: Secondary | ICD-10-CM

## 2012-07-08 DIAGNOSIS — I959 Hypotension, unspecified: Secondary | ICD-10-CM

## 2012-07-08 DIAGNOSIS — J449 Chronic obstructive pulmonary disease, unspecified: Secondary | ICD-10-CM

## 2012-07-08 DIAGNOSIS — M4800 Spinal stenosis, site unspecified: Secondary | ICD-10-CM

## 2012-07-08 DIAGNOSIS — I4891 Unspecified atrial fibrillation: Secondary | ICD-10-CM

## 2012-07-08 DIAGNOSIS — Z8673 Personal history of transient ischemic attack (TIA), and cerebral infarction without residual deficits: Secondary | ICD-10-CM

## 2012-07-08 DIAGNOSIS — J4489 Other specified chronic obstructive pulmonary disease: Secondary | ICD-10-CM

## 2012-07-08 DIAGNOSIS — K2100 Gastro-esophageal reflux disease with esophagitis, without bleeding: Secondary | ICD-10-CM

## 2012-07-08 DIAGNOSIS — R609 Edema, unspecified: Secondary | ICD-10-CM

## 2012-07-08 DIAGNOSIS — J479 Bronchiectasis, uncomplicated: Secondary | ICD-10-CM

## 2012-07-08 DIAGNOSIS — J301 Allergic rhinitis due to pollen: Secondary | ICD-10-CM

## 2012-07-08 DIAGNOSIS — E876 Hypokalemia: Secondary | ICD-10-CM

## 2012-07-08 HISTORY — DX: Major depressive disorder, single episode, unspecified: F32.9

## 2012-07-08 HISTORY — DX: Chest pain, unspecified: R07.9

## 2012-07-08 HISTORY — DX: Personal history of transient ischemic attack (TIA), and cerebral infarction without residual deficits: Z86.73

## 2012-07-08 HISTORY — DX: Hypokalemia: E87.6

## 2012-07-08 HISTORY — DX: Chronic obstructive pulmonary disease, unspecified: J44.9

## 2012-07-08 HISTORY — DX: Allergic rhinitis due to pollen: J30.1

## 2012-07-08 HISTORY — DX: Other specified chronic obstructive pulmonary disease: J44.89

## 2012-07-08 HISTORY — DX: Hypotension, unspecified: I95.9

## 2012-07-08 HISTORY — DX: Unspecified atrial fibrillation: I48.91

## 2012-07-08 HISTORY — DX: Hypo-osmolality and hyponatremia: E87.1

## 2012-07-08 HISTORY — DX: Spinal stenosis, site unspecified: M48.00

## 2012-07-08 HISTORY — DX: Bronchiectasis, uncomplicated: J47.9

## 2012-07-08 HISTORY — DX: Edema, unspecified: R60.9

## 2012-07-08 HISTORY — DX: Gastro-esophageal reflux disease with esophagitis, without bleeding: K21.00

## 2012-07-08 HISTORY — DX: Acute posthemorrhagic anemia: D62

## 2012-07-11 LAB — CULTURE, BLOOD (ROUTINE X 2): Culture: NO GROWTH

## 2012-07-13 DIAGNOSIS — R413 Other amnesia: Secondary | ICD-10-CM

## 2012-07-13 HISTORY — DX: Other amnesia: R41.3

## 2012-07-30 ENCOUNTER — Encounter: Payer: Self-pay | Admitting: Cardiovascular Disease

## 2012-07-30 ENCOUNTER — Ambulatory Visit (INDEPENDENT_AMBULATORY_CARE_PROVIDER_SITE_OTHER): Payer: Medicare Other | Admitting: Cardiovascular Disease

## 2012-07-30 VITALS — BP 106/54 | HR 56 | Ht 69.0 in | Wt 179.0 lb

## 2012-07-30 DIAGNOSIS — I25810 Atherosclerosis of coronary artery bypass graft(s) without angina pectoris: Secondary | ICD-10-CM

## 2012-07-30 DIAGNOSIS — I4891 Unspecified atrial fibrillation: Secondary | ICD-10-CM

## 2012-07-30 NOTE — Patient Instructions (Addendum)
Your physician wants you to follow-up in:  6 months. You will receive a reminder letter in the mail two months in advance. If you don't receive a letter, please call our office to schedule the follow-up appointment.   

## 2012-07-30 NOTE — Progress Notes (Signed)
History of Present Illness: 78 yo WM with history of CAD, COPD, DVT, HTN, HLD here today for cardiac follow up. He has been followed in the past by Dr Deborah Chalk and was last seen in this office by Norma Fredrickson, NP in June 2012. Cardiac cath in July 2001 with 60% ostial LAD stenosis, minor disease in the RCA. He has a history of right LE DVT in 2010. He has been off of coumadin per Dr. Jacky Kindle because of his fall risk. He was admitted to [facility] 07/04/12 for altered mental status after a fall. He does not remember why he fell. He had expressive aphasia. He was found to be in atrial fibrillation with RVR. He was started on Cardizem and had periods of sinus rhythm. He was felt to have had a TIA. He was not started on anti-coagulation secondary to advanced age and fall risk.   He is here today for cardiac follow up. He tells me that he feels well. He denies any chest pain or pressure. No SOB, palpitations.He and his wife report in imbalance but he is working with PT. Crestor stopped in primary care.   Primary Care Physician: Dr. Jacky Kindle.   Last Lipid Profile:Lipid Panel     Component Value Date/Time   CHOL 151 07/06/2012 0415   TRIG 70 07/06/2012 0415   HDL 44 07/06/2012 0415   CHOLHDL 3.4 07/06/2012 0415   VLDL 14 07/06/2012 0415   LDLCALC 93 07/06/2012 0415     Past Medical History  Diagnosis Date  . Allergic rhinitis   . 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 COPD (chronic obstructive pulmonary disease)   . Diverticula of colon   . Right leg DVT 01/2010  . IHD (ischemic heart disease)   . Hyperlipidemia   . Hypertension   . Depression   . Myalgia   . Hypothyroidism   . Bronchiectasis   . Spinal stenosis   . History of heartburn   . Joint pain   . 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 Coronary artery disease     Past Surgical History  Procedure Date  . Cardiac catheterization 06/13/2000    normal. ef 60%  . Cardiovascular stress test  04/2010    ef 54%, and normal perfusion  . Coronary angioplasty 1991    1st diagonal  . Carpal tunnel release     right  . Rotator cuff repair     left shoulder  . Knee surgery August  2006    S/P right knee replacement  . Laminectomy     2005  . Cataract extraction     Bilateral     Current Outpatient Prescriptions  Medication Sig Dispense Refill  . acetaminophen (TYLENOL) 500 MG tablet Take 500 mg by mouth every 6 (six) hours as needed. pain      . aspirin 325 MG tablet Take 325 mg by mouth daily.      . nitroGLYCERIN (NITROSTAT) 0.4 MG SL tablet Place 0.4 mg under the tongue every 5 (five) minutes as needed. May repeat x3      . budesonide-formoterol (SYMBICORT) 160-4.5 MCG/ACT inhaler Inhale 2 puffs into the lungs 2 (two) times daily.  3 Inhaler  1  . diltiazem (CARDIZEM CD) 300 MG 24 hr capsule Take 300 mg by mouth daily.       . famotidine (PEPCID) 20 MG tablet Take 20 mg by mouth at bedtime.      Marland Kitchen  fluticasone (FLONASE) 50 MCG/ACT nasal spray Place 2 sprays into the nose 2 (two) times daily.      Marland Kitchen levothyroxine (SYNTHROID, LEVOTHROID) 50 MCG tablet Take 50 mcg by mouth daily.        . meloxicam (MOBIC) 7.5 MG tablet Take 7.5 mg by mouth 2 (two) times daily.      . Multiple Vitamins-Minerals (PRESERVISION AREDS) CAPS Take 1 capsule by mouth daily.        Allergies  Allergen Reactions  . Sulfonamide Derivatives Other (See Comments)    unknown  . Penicillins Rash    History   Social History  . Marital Status: Married    Spouse Name: N/A    Number of Children: N/A  . Years of Education: N/A   Occupational History  . Not on file.   Social History Main Topics  . Smoking status: Former Smoker -- 0.5 packs/day for 50 years    Types: Cigarettes    Quit date: 11/18/1952  . Smokeless tobacco: Never Used  . Alcohol Use: No  . Drug Use: No  . Sexually Active: Not on file   Other Topics Concern  . Not on file   Social History Narrative  . No narrative on file     Family History  Problem Relation Age of Onset  . Heart disease Father   . Heart failure Father   . Breast cancer Maternal Aunt     Review of Systems:  As stated in the HPI and otherwise negative.   BP 106/54  Pulse 56  Ht 5\' 9"  (1.753 m)  Wt 179 lb (81.194 kg)  BMI 26.43 kg/m2  Physical Examination: General: Well developed, well nourished, NAD HEENT: OP clear, mucus membranes moist SKIN: warm, dry. No rashes. Neuro: No focal deficits Musculoskeletal: Muscle strength 5/5 all ext Psychiatric: Mood and affect normal Neck: No JVD, no carotid bruits, no thyromegaly, no lymphadenopathy. Lungs:Clear bilaterally, no wheezes, rhonci, crackles Cardiovascular: Regular rate and rhythm. No murmurs, gallops or rubs. Abdomen:Soft. Bowel sounds present. Non-tender.  Extremities: No lower extremity edema. Pulses are 2 + in the bilateral DP/PT.  ECHO: - Left ventricle: The cavity size was normal. Wall thickness was increased in a pattern of mild LVH. Systolic function was mildly reduced. The estimated ejection fraction was in the range of 45% to 50%. Regional wall motion abnormalities cannot be excluded. - Ventricular septum: Septal motion showed paradox. - Aortic valve: Mild to moderate regurgitation. - Mitral valve: Mild regurgitation.  EKG: Sinus brady, rate 57 bpm. IVCD  Assessment and Plan:   1. CAD: Stable. He is only on ASA. He has been off of Crestor. This was stopped in primary care per pt wife and she is not sure why. LDL 93 last month.   2. Atrial fibrillation: He is in sinus today. Continue Cardizem. Recent symptoms c/w TIA. He has not been felt to be a candidate for long term anti-coagulation secondary to his fall risk. Will continue ASA 325 mg po Qdaily only. I have spent 15 minutes reviewing the mechanism and treatment of atrial fibrillation including future CVA risk and reasoning for not starting anti-coagulation.

## 2012-07-31 ENCOUNTER — Telehealth: Payer: Self-pay | Admitting: Internal Medicine

## 2012-07-31 ENCOUNTER — Encounter: Payer: Self-pay | Admitting: Adult Health

## 2012-07-31 ENCOUNTER — Ambulatory Visit (INDEPENDENT_AMBULATORY_CARE_PROVIDER_SITE_OTHER): Payer: Medicare Other | Admitting: Adult Health

## 2012-07-31 ENCOUNTER — Ambulatory Visit (INDEPENDENT_AMBULATORY_CARE_PROVIDER_SITE_OTHER)
Admission: RE | Admit: 2012-07-31 | Discharge: 2012-07-31 | Disposition: A | Discharge status: Home / Self Care | Payer: Medicare Other | Source: Ambulatory Visit | Attending: Adult Health | Admitting: Adult Health

## 2012-07-31 VITALS — BP 122/74 | HR 63 | Temp 97.7°F | Ht 69.0 in | Wt 177.4 lb

## 2012-07-31 DIAGNOSIS — J471 Bronchiectasis with (acute) exacerbation: Secondary | ICD-10-CM

## 2012-07-31 MED ORDER — LEVOFLOXACIN 500 MG PO TABS: 500.0000 mg | ORAL_TABLET | Freq: Every day | ORAL | Status: DC

## 2012-07-31 NOTE — Telephone Encounter (Signed)
[email or web address]  

## 2012-07-31 NOTE — Progress Notes (Signed)
Subjective:    Patient ID: [name], male    DOB: 11-26-1923.   MRN: [id number]  Brief patient profile:  87 yowm quit smoking in 1953 with minimal airflow obstruction by PFT's 12/07 and evidence of MAI infection by previous FOB (see PMHX)   September 08, 2009 Acute visit. Pt c/o prod cough with yellow sputum x 1 wk. Pt states that his breathing has been okay but he does state that lying flat on his back can make him SOB. better on mucinex. Wife reports more hoarseness x 1 year ? advair related. symbicort 160 two times a day    05/01/2012 f/u ov/Wert found couldn't do s symbicort  cc variable worse overall sob x 2 months, indolent onset, gradually worse, esp at hs but not typically with exertion, doesn't wake up with it, better if prop up, no assoc cough, no leg swelling, hfa poor even  with spacer >>PPI/pepcid rx , CXR w/ chronic changes, no acute process.   05/15/2012 Follow up and Med review  Returns for follow up and med review.  We reviewed all his meds and organized them into a med calendar w/ pt education His wife helps him with his meds.  Unable to tolerate PPI and Pepcid-causes stomach ache and sore throat.  Breathing is at baseline with no increased cough/congestion or fever.  >>med calendar   07/31/2012 Acute OV  Complains of fever up to 101, prod cough with yellow/green mucus x3 weeks .  Was admitted 1 month ago for Atrial Fib , discharged to [facility] until 3 days ago.  Has had cough /congesiton x 3 weeks. Worse x 2 days . Last night fever 101 and fell getting out of bed. Did have some intermittent confusion that resolved today . Weaker than usual.  Eating and drinking okay.  No hemoptysis or edema.     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)  - FOB 3/98  - Rx 3/06 > 11/06  COPD (ICD-496)  - PFT's with minimal changes 10/2006 - HFA 50% January 17, 2010 > 50% 06/27/2011 with spacer added for  hoarseness [facility]  Health Maintenance.............................................................................Marland KitchenJacky Kindle  - Pneumovax 2/04 second shot  Right DVT  - 01/2010 >>coumadin  Complex med regimen>med calendar 05/15/2012   ROS;  Constitutional:   No  weight loss, night sweats,  Fevers, chills,  +fatigue, or  lassitude.  HEENT:   No headaches,  Difficulty swallowing,  Tooth/dental problems, or  Sore throat,                No sneezing, itching, ear ache, nasal congestion, post nasal drip,   CV:  No chest pain,  Orthopnea, PND, swelling in lower extremities, anasarca, dizziness, palpitations, syncope.   GI  No heartburn, indigestion, abdominal pain, nausea, vomiting, diarrhea, change in bowel habits, loss of appetite, bloody stools.   Resp:    No coughing up of blood.     No chest wall deformity  Skin: no rash or lesions.  GU: no dysuria, change in color of urine, no urgency or frequency.  No flank pain, no hematuria   MS:  No joint pain or swelling.  No decreased range of motion.    Psych: . No depression or anxiety          Objective:   Physical Exam  wt 186 November 22, 2008 > 182 September 25, 2010 >   184 02/13/2011 > 182 06/27/2011 > 05/01/2012 180 >180 05/15/2012 >>177  amb chronically  ill wm nad in wheelchair  HEENT mild turbinate edema. Oropharynx no thrush or excess pnd or cobblestoning. No JVD or cervical adenopathy. Mild accessory muscle hypertrophy. Trachea midline, nl thryroid. Chest  Coarse BS  Regular rate and rhythm without murmur gallop or rub or increase P2 no edema. Abd: no hsm, nl excursion. Ext warm without calf tenderness or clubbing.       cxr  07/04/12  Stable chronic upper lobe densities bilaterally compatible with  scarring. Cavitary lesion in the right upper lobe again noted. No  current air-fluid level as seen previously.      Assessment &  Plan:

## 2012-07-31 NOTE — Patient Instructions (Addendum)
Levaquin 500mg  daily for 10 days  Mucinex DM Twice daily  As needed  Cough/congestion  Fluids and rest  Tylenol As needed  Fever  If he is not improving or worsens will need to go to ER for admission  Please contact office for sooner follow up if symptoms do not improve or worsen or seek emergency care  Follow up Dr. Sherene Sires  In 2 weeks

## 2012-07-31 NOTE — Telephone Encounter (Signed)
Spoke with pt's spouse. She states that the pt is having increased cough with green to yellow sputum and fever. OV with TP for 3:15 pm today.

## 2012-07-31 NOTE — Assessment & Plan Note (Signed)
Exacerbation  Declines admission   Plan;  Levaquin 500mg  daily for 10 days  Mucinex DM Twice daily  As needed  Cough/congestion  Fluids and rest  Tylenol As needed  Fever  If he is not improving or worsens will need to go to ER for admission  Please contact office for sooner follow up if symptoms do not improve or worsen or seek emergency care  Follow up Dr. Sherene Sires  In 2 weeks

## 2012-08-02 ENCOUNTER — Encounter (HOSPITAL_COMMUNITY): Payer: Self-pay

## 2012-08-02 ENCOUNTER — Inpatient Hospital Stay (HOSPITAL_COMMUNITY)
Admission: EM | Admit: 2012-08-02 | Discharge: 2012-08-11 | DRG: 177 | Disposition: A | Discharge status: Skilled Nursing Facility | Payer: Medicare Other | Attending: Internal Medicine | Admitting: Internal Medicine

## 2012-08-02 ENCOUNTER — Emergency Department (HOSPITAL_COMMUNITY): Payer: Medicare Other

## 2012-08-02 DIAGNOSIS — E871 Hypo-osmolality and hyponatremia: Secondary | ICD-10-CM | POA: Diagnosis present

## 2012-08-02 DIAGNOSIS — R0600 Dyspnea, unspecified: Secondary | ICD-10-CM | POA: Diagnosis present

## 2012-08-02 DIAGNOSIS — Z87891 Personal history of nicotine dependence: Secondary | ICD-10-CM

## 2012-08-02 DIAGNOSIS — B999 Unspecified infectious disease: Secondary | ICD-10-CM | POA: Diagnosis present

## 2012-08-02 DIAGNOSIS — J449 Chronic obstructive pulmonary disease, unspecified: Secondary | ICD-10-CM | POA: Diagnosis present

## 2012-08-02 DIAGNOSIS — I259 Chronic ischemic heart disease, unspecified: Secondary | ICD-10-CM | POA: Diagnosis present

## 2012-08-02 DIAGNOSIS — E876 Hypokalemia: Secondary | ICD-10-CM | POA: Diagnosis present

## 2012-08-02 DIAGNOSIS — J189 Pneumonia, unspecified organism: Secondary | ICD-10-CM | POA: Diagnosis present

## 2012-08-02 DIAGNOSIS — IMO0002 Reserved for concepts with insufficient information to code with codable children: Secondary | ICD-10-CM

## 2012-08-02 DIAGNOSIS — K219 Gastro-esophageal reflux disease without esophagitis: Secondary | ICD-10-CM | POA: Diagnosis present

## 2012-08-02 DIAGNOSIS — R627 Adult failure to thrive: Secondary | ICD-10-CM | POA: Diagnosis present

## 2012-08-02 DIAGNOSIS — J471 Bronchiectasis with (acute) exacerbation: Secondary | ICD-10-CM

## 2012-08-02 DIAGNOSIS — E43 Unspecified severe protein-calorie malnutrition: Secondary | ICD-10-CM | POA: Diagnosis present

## 2012-08-02 DIAGNOSIS — A318 Other mycobacterial infections: Secondary | ICD-10-CM | POA: Diagnosis not present

## 2012-08-02 DIAGNOSIS — F0390 Unspecified dementia without behavioral disturbance: Secondary | ICD-10-CM | POA: Diagnosis present

## 2012-08-02 DIAGNOSIS — D649 Anemia, unspecified: Secondary | ICD-10-CM | POA: Diagnosis present

## 2012-08-02 DIAGNOSIS — R0989 Other specified symptoms and signs involving the circulatory and respiratory systems: Secondary | ICD-10-CM | POA: Diagnosis present

## 2012-08-02 DIAGNOSIS — G929 Unspecified toxic encephalopathy: Secondary | ICD-10-CM | POA: Diagnosis present

## 2012-08-02 DIAGNOSIS — E785 Hyperlipidemia, unspecified: Secondary | ICD-10-CM | POA: Diagnosis present

## 2012-08-02 DIAGNOSIS — Z66 Do not resuscitate: Secondary | ICD-10-CM | POA: Diagnosis present

## 2012-08-02 DIAGNOSIS — J309 Allergic rhinitis, unspecified: Secondary | ICD-10-CM | POA: Diagnosis present

## 2012-08-02 DIAGNOSIS — Z515 Encounter for palliative care: Secondary | ICD-10-CM

## 2012-08-02 DIAGNOSIS — R0609 Other forms of dyspnea: Secondary | ICD-10-CM | POA: Diagnosis present

## 2012-08-02 DIAGNOSIS — A310 Pulmonary mycobacterial infection: Principal | ICD-10-CM | POA: Diagnosis present

## 2012-08-02 DIAGNOSIS — G92 Toxic encephalopathy: Secondary | ICD-10-CM | POA: Diagnosis present

## 2012-08-02 DIAGNOSIS — J4489 Other specified chronic obstructive pulmonary disease: Secondary | ICD-10-CM | POA: Diagnosis present

## 2012-08-02 DIAGNOSIS — R7881 Bacteremia: Secondary | ICD-10-CM | POA: Diagnosis present

## 2012-08-02 DIAGNOSIS — E039 Hypothyroidism, unspecified: Secondary | ICD-10-CM | POA: Diagnosis present

## 2012-08-02 DIAGNOSIS — I4891 Unspecified atrial fibrillation: Secondary | ICD-10-CM | POA: Diagnosis present

## 2012-08-02 DIAGNOSIS — I10 Essential (primary) hypertension: Secondary | ICD-10-CM | POA: Diagnosis present

## 2012-08-02 DIAGNOSIS — F3289 Other specified depressive episodes: Secondary | ICD-10-CM | POA: Diagnosis present

## 2012-08-02 DIAGNOSIS — R4182 Altered mental status, unspecified: Secondary | ICD-10-CM | POA: Diagnosis present

## 2012-08-02 DIAGNOSIS — F329 Major depressive disorder, single episode, unspecified: Secondary | ICD-10-CM | POA: Diagnosis present

## 2012-08-02 LAB — COMPREHENSIVE METABOLIC PANEL
ALT: 9 U/L (ref 0–53)
AST: 24 U/L (ref 0–37)
Calcium: 8.1 mg/dL — ABNORMAL LOW (ref 8.4–10.5)
Creatinine, Ser: 1.13 mg/dL (ref 0.50–1.35)
GFR calc Af Amer: 66 mL/min — ABNORMAL LOW (ref >90)
Sodium: 120 mEq/L — ABNORMAL LOW (ref 135–145)
Total Protein: 7 g/dL (ref 6.0–8.3)

## 2012-08-02 LAB — URINALYSIS, ROUTINE W REFLEX MICROSCOPIC
Bilirubin Urine: NEGATIVE
Nitrite: NEGATIVE
Specific Gravity, Urine: 1.023 (ref 1.005–1.030)
pH: 6.5 (ref 5.0–8.0)

## 2012-08-02 LAB — BLOOD GAS, ARTERIAL
Bicarbonate: 18 mEq/L — ABNORMAL LOW (ref 20.0–24.0)
Drawn by: 340271
FIO2: 0.28 %
O2 Saturation: 95.7 %
Patient temperature: 98.6
pH, Arterial: 7.461 — ABNORMAL HIGH (ref 7.350–7.450)

## 2012-08-02 LAB — CBC WITH DIFFERENTIAL/PLATELET
Basophils Absolute: 0 10*3/uL (ref 0.0–0.1)
Eosinophils Absolute: 0 10*3/uL (ref 0.0–0.7)
Eosinophils Relative: 0 % (ref 0–5)
MCH: 33.2 pg (ref 26.0–34.0)
MCHC: 35.9 g/dL (ref 30.0–36.0)
MCV: 92.4 fL (ref 78.0–100.0)
Platelets: 243 10*3/uL (ref 150–400)
RDW: 13.9 % (ref 11.5–15.5)

## 2012-08-02 LAB — CREATININE, SERUM
Creatinine, Ser: 1.01 mg/dL (ref 0.50–1.35)
GFR calc non Af Amer: 65 mL/min — ABNORMAL LOW (ref >90)

## 2012-08-02 LAB — CBC
MCHC: 35.8 g/dL (ref 30.0–36.0)
Platelets: 236 10*3/uL (ref 150–400)
RDW: 14.1 % (ref 11.5–15.5)

## 2012-08-02 LAB — URINE MICROSCOPIC-ADD ON

## 2012-08-02 MED ORDER — SODIUM CHLORIDE 0.9 % IV SOLN: Freq: Once | INTRAVENOUS | Status: DC

## 2012-08-02 MED ORDER — FLUTICASONE PROPIONATE 50 MCG/ACT NA SUSP
2.0000 | Freq: Two times a day (BID) | NASAL | Status: DC
  Administered 2012-08-02 – 2012-08-11 (×17): 2 via NASAL
  Filled 2012-08-02: qty 16

## 2012-08-02 MED ORDER — DEXTROSE 5 % IV SOLN
500.0000 mg | Freq: Once | INTRAVENOUS | Status: AC
  Administered 2012-08-02: 500 mg via INTRAVENOUS
  Filled 2012-08-02: qty 500

## 2012-08-02 MED ORDER — GUAIFENESIN ER 600 MG PO TB12
1200.0000 mg | ORAL_TABLET | Freq: Two times a day (BID) | ORAL | Status: DC
  Administered 2012-08-02 – 2012-08-03 (×3): 1200 mg via ORAL
  Filled 2012-08-02 (×5): qty 2

## 2012-08-02 MED ORDER — ACETAMINOPHEN 500 MG PO TABS
500.0000 mg | ORAL_TABLET | Freq: Four times a day (QID) | ORAL | Status: DC | PRN
  Filled 2012-08-02: qty 1

## 2012-08-02 MED ORDER — MEMANTINE HCL 10 MG PO TABS
10.0000 mg | ORAL_TABLET | Freq: Two times a day (BID) | ORAL | Status: DC
  Administered 2012-08-02 – 2012-08-11 (×18): 10 mg via ORAL
  Filled 2012-08-02 (×19): qty 1

## 2012-08-02 MED ORDER — ASPIRIN 325 MG PO TABS
325.0000 mg | ORAL_TABLET | Freq: Every day | ORAL | Status: DC
  Administered 2012-08-03 – 2012-08-11 (×9): 325 mg via ORAL
  Filled 2012-08-02 (×9): qty 1

## 2012-08-02 MED ORDER — FLUTICASONE-SALMETEROL 250-50 MCG/DOSE IN AEPB
1.0000 | INHALATION_SPRAY | Freq: Two times a day (BID) | RESPIRATORY_TRACT | Status: DC
  Administered 2012-08-02 – 2012-08-07 (×10): 1 via RESPIRATORY_TRACT
  Filled 2012-08-02: qty 14

## 2012-08-02 MED ORDER — SODIUM CHLORIDE 0.9 % IV SOLN
INTRAVENOUS | Status: DC
  Administered 2012-08-03: 05:00:00 via INTRAVENOUS

## 2012-08-02 MED ORDER — ENOXAPARIN SODIUM 30 MG/0.3ML ~~LOC~~ SOLN
30.0000 mg | SUBCUTANEOUS | Status: DC
  Administered 2012-08-02 – 2012-08-03 (×2): 30 mg via SUBCUTANEOUS
  Filled 2012-08-02 (×3): qty 0.3

## 2012-08-02 MED ORDER — INFLUENZA VIRUS VACC SPLIT PF IM SUSP
0.5000 mL | INTRAMUSCULAR | Status: AC
  Administered 2012-08-03: 0.5 mL via INTRAMUSCULAR
  Filled 2012-08-02: qty 0.5

## 2012-08-02 MED ORDER — NITROGLYCERIN 0.4 MG SL SUBL: 0.4000 mg | SUBLINGUAL_TABLET | SUBLINGUAL | Status: DC | PRN

## 2012-08-02 MED ORDER — ALBUTEROL SULFATE (5 MG/ML) 0.5% IN NEBU
2.5000 mg | INHALATION_SOLUTION | RESPIRATORY_TRACT | Status: DC | PRN
  Filled 2012-08-02 (×2): qty 0.5

## 2012-08-02 MED ORDER — DEXTROSE 5 % IV SOLN
1.0000 g | INTRAVENOUS | Status: DC
  Administered 2012-08-02 – 2012-08-06 (×5): 1 g via INTRAVENOUS
  Filled 2012-08-02 (×6): qty 10

## 2012-08-02 MED ORDER — FAMOTIDINE 20 MG PO TABS
20.0000 mg | ORAL_TABLET | Freq: Every day | ORAL | Status: DC
  Administered 2012-08-02 – 2012-08-09 (×8): 20 mg via ORAL
  Filled 2012-08-02 (×9): qty 1

## 2012-08-02 MED ORDER — LEVOTHYROXINE SODIUM 50 MCG PO TABS
50.0000 ug | ORAL_TABLET | Freq: Every day | ORAL | Status: DC
  Administered 2012-08-03 – 2012-08-11 (×9): 50 ug via ORAL
  Filled 2012-08-02 (×10): qty 1

## 2012-08-02 MED ORDER — DILTIAZEM HCL ER COATED BEADS 300 MG PO CP24
300.0000 mg | ORAL_CAPSULE | Freq: Every day | ORAL | Status: DC
  Administered 2012-08-03 – 2012-08-11 (×9): 300 mg via ORAL
  Filled 2012-08-02 (×10): qty 1

## 2012-08-02 MED ORDER — SODIUM CHLORIDE 0.9 % IJ SOLN
3.0000 mL | Freq: Two times a day (BID) | INTRAMUSCULAR | Status: DC
  Administered 2012-08-03 – 2012-08-10 (×5): 3 mL via INTRAVENOUS

## 2012-08-02 MED ORDER — SODIUM CHLORIDE 0.9 % IV BOLUS (SEPSIS)
1000.0000 mL | Freq: Once | INTRAVENOUS | Status: AC
  Administered 2012-08-02: 1000 mL via INTRAVENOUS

## 2012-08-02 NOTE — ED Provider Notes (Signed)
History     CSN: [id number]  Arrival date & time 08/02/12  1118   First MD Initiated Contact with Patient 08/02/12 1131      Chief Complaint  Patient presents with  . Fever  . Fatigue    (Consider location/radiation/quality/duration/timing/severity/associated sxs/prior treatment) Patient is a 78 y.o. male presenting with fever. The history is provided by the patient and the spouse. The history is limited by the condition of the patient (Altered mental status).  Fever Primary symptoms of the febrile illness include fever.  He has had a fever at home for the last 3 days. Fevers been as high as 102. Today, his spouse was unable to arouse him. He had been seen by his pulmonologist 3 days ago, and chest x-ray had been obtained. He denies chest pain, cough, dyspnea, nausea, vomiting, diarrhea. He denies arthralgias and myalgias. He had been admitted to the hospital last month and was felt to have a TIA and possible episode of atrial fibrillation.  Past Medical History  Diagnosis Date  . Allergic rhinitis   . 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 COPD (chronic obstructive pulmonary disease)   . Diverticula of colon   . Right leg DVT 01/2010  . IHD (ischemic heart disease)   . Hyperlipidemia   . Hypertension   . Depression   . Myalgia   . Hypothyroidism   . Bronchiectasis   . Spinal stenosis   . History of heartburn   . Joint pain   . 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 Coronary artery disease     Past Surgical History  Procedure Date  . Cardiac catheterization 06/13/2000    normal. ef 60%  . Cardiovascular stress test 04/2010    ef 54%, and normal perfusion  . Coronary angioplasty 1991    1st diagonal  . Carpal tunnel release     right  . Rotator cuff repair     left shoulder  . Knee surgery August  2006    S/P right knee replacement  . Laminectomy     2005  . Cataract extraction     Bilateral     Family History  Problem  Relation Age of Onset  . Heart disease Father   . Heart failure Father   . Breast cancer Maternal Aunt     History  Substance Use Topics  . Smoking status: Former Smoker -- 0.5 packs/day for 50 years    Types: Cigarettes    Quit date: 11/18/1952  . Smokeless tobacco: Never Used  . Alcohol Use: No      Review of Systems  Unable to perform ROS: Mental status change  Constitutional: Positive for fever.    Allergies  Sulfonamide derivatives and Penicillins  Home Medications   Current Outpatient Rx  Name Route Sig Dispense Refill  . ACETAMINOPHEN 500 MG PO TABS Oral Take 500 mg by mouth every 6 (six) hours as needed. pain    . ASPIRIN 325 MG PO TABS Oral Take 325 mg by mouth daily.    Marland Kitchen DILTIAZEM HCL ER COATED BEADS 300 MG PO CP24 Oral Take 300 mg by mouth daily.     Marland Kitchen FAMOTIDINE 20 MG PO TABS Oral Take 20 mg by mouth at bedtime.    Marland Kitchen FLUTICASONE PROPIONATE 50 MCG/ACT NA SUSP Nasal Place 2 sprays into the nose 2 (two) times daily.    Marland Kitchen FLUTICASONE-SALMETEROL 250-50 MCG/DOSE IN AEPB Inhalation Inhale 1 puff  into the lungs every 12 (twelve) hours.    Marland Kitchen LEVOFLOXACIN 500 MG PO TABS Oral Take 1 tablet (500 mg total) by mouth daily. 10 tablet 0  . LEVOTHYROXINE SODIUM 50 MCG PO TABS Oral Take 50 mcg by mouth daily.      . MELOXICAM 7.5 MG PO TABS Oral Take 7.5 mg by mouth 2 (two) times daily.    Marland Kitchen MEMANTINE HCL 10 MG PO TABS Oral Take 10 mg by mouth 2 (two) times daily.    Marland Kitchen PRESERVISION AREDS PO CAPS Oral Take 1 capsule by mouth daily.    Marland Kitchen NITROGLYCERIN 0.4 MG SL SUBL Sublingual Place 0.4 mg under the tongue every 5 (five) minutes as needed. May repeat x3      BP 125/71  Pulse 87  Temp 102.5 F (39.2 C) (Rectal)  Resp 28  SpO2 100%  Physical Exam  Nursing note and vitals reviewed. 78year old male, resting comfortably and in no acute distress. Vital signs are significant for fever with temperature 102.5, and tachypnea with respiratory rate of 28. Oxygen saturation is 91%,  which is hypoxic, oxygen saturation increased to 100% when placed on nasal oxygen. Head is normocephalic and atraumatic. PERRLA, EOMI. Oropharynx is clear. Neck is nontender and supple without adenopathy or JVD. Back is nontender and there is no CVA tenderness. Lungs are clear without rales, wheezes, or rhonchi. Breath sounds are somewhat distant. Chest is nontender. Heart has regular rate and rhythm without murmur. Abdomen is soft, flat, nontender without masses or hepatosplenomegaly and peristalsis is normoactive. Extremities have no cyanosis or edema, full range of motion is present. Skin is warm and dry without rash. Neurologic: He is somnolent but arousable, he is oriented to person but not place or time, cranial nerves are intact, there are no gross motor or sensory deficits.   ED Course  Procedures (including critical care time)  Results for orders placed during the hospital encounter of 08/02/12  CBC WITH DIFFERENTIAL      Component Value Range   WBC 12.1 (*) 4.0 - 10.5 K/uL   RBC 3.31 (*) 4.22 - 5.81 MIL/uL   Hemoglobin 11.0 (*) 13.0 - 17.0 g/dL   HCT 45.4 (*) 09.8 - 11.9 %   MCV 92.4  78.0 - 100.0 fL   MCH 33.2  26.0 - 34.0 pg   MCHC 35.9  30.0 - 36.0 g/dL   RDW 14.7  82.9 - 56.2 %   Platelets 243  150 - 400 K/uL   Neutrophils Relative 78 (*) 43 - 77 %   Neutro Abs 9.4 (*) 1.7 - 7.7 K/uL   Lymphocytes Relative 7 (*) 12 - 46 %   Lymphs Abs 0.9  0.7 - 4.0 K/uL   Monocytes Relative 15 (*) 3 - 12 %   Monocytes Absolute 1.8 (*) 0.1 - 1.0 K/uL   Eosinophils Relative 0  0 - 5 %   Eosinophils Absolute 0.0  0.0 - 0.7 K/uL   Basophils Relative 0  0 - 1 %   Basophils Absolute 0.0  0.0 - 0.1 K/uL  COMPREHENSIVE METABOLIC PANEL      Component Value Range   Sodium 120 (*) 135 - 145 mEq/L   Potassium 3.6  3.5 - 5.1 mEq/L   Chloride 87 (*) 96 - 112 mEq/L   CO2 19  19 - 32 mEq/L   Glucose, Bld 82  70 - 99 mg/dL   BUN 15  6 - 23 mg/dL   Creatinine, Ser 1.30  0.50 - 1.35 mg/dL    Calcium 8.1 (*) 8.4 - 10.5 mg/dL   Total Protein 7.0  6.0 - 8.3 g/dL   Albumin 2.6 (*) 3.5 - 5.2 g/dL   AST 24  0 - 37 U/L   ALT 9  0 - 53 U/L   Alkaline Phosphatase 60  39 - 117 U/L   Total Bilirubin 0.5  0.3 - 1.2 mg/dL   GFR calc non Af Amer 57 (*) >90 mL/min   GFR calc Af Amer 66 (*) >90 mL/min  URINALYSIS, ROUTINE W REFLEX MICROSCOPIC      Component Value Range   Color, Urine YELLOW  YELLOW   APPearance CLEAR  CLEAR   Specific Gravity, Urine 1.023  1.005 - 1.030   pH 6.5  5.0 - 8.0   Glucose, UA NEGATIVE  NEGATIVE mg/dL   Hgb urine dipstick MODERATE (*) NEGATIVE   Bilirubin Urine NEGATIVE  NEGATIVE   Ketones, ur 15 (*) NEGATIVE mg/dL   Protein, ur 30 (*) NEGATIVE mg/dL   Urobilinogen, UA 1.0  0.0 - 1.0 mg/dL   Nitrite NEGATIVE  NEGATIVE   Leukocytes, UA NEGATIVE  NEGATIVE  BLOOD GAS, ARTERIAL      Component Value Range   FIO2 0.28     Delivery systems NASAL CANNULA     pH, Arterial 7.461 (*) 7.350 - 7.450   pCO2 arterial 25.6 (*) 35.0 - 45.0 mmHg   pO2, Arterial 81.9  80.0 - 100.0 mmHg   Bicarbonate 18.0 (*) 20.0 - 24.0 mEq/L   TCO2 16.3  0 - 100 mmol/L   Acid-base deficit 4.3 (*) 0.0 - 2.0 mmol/L   O2 Saturation 95.7     Patient temperature 98.6     Collection site RIGHT RADIAL     Drawn by [id number]     Sample type ARTERIAL DRAW     Allens test (pass/fail) PASS  PASS  URINE MICROSCOPIC-ADD ON      Component Value Range   WBC, UA 0-2  <3 WBC/hpf   RBC / HPF 3-6  <3 RBC/hpf   Bacteria, UA RARE  RARE   Dg Chest Portable 1 View  08/02/2012  *RADIOLOGY REPORT*  Clinical Data: Fever.  Fatigue.  Hypoxia.  PORTABLE CHEST - 1 VIEW  Comparison: 07/31/2012  Findings: Cavitary lesion in the right upper lobe measures approximately 6.2 x 3.8 cm.  Previously seen internal air-fluid level is not readily apparent although given in semi erect positioning would not necessarily be apparent.  Adjacent pleural thickening appears stable.  Hazy interstitial opacity is present throughout  both lungs, increased from prior.  Airspace opacity obscures the left heart border and left lung base, new compared to prior.  There is evidence of old granulomatous disease.  Atherosclerotic calcification of the aortic arch noted.  IMPRESSION:  1. Essentially stable in size cavitary lesion in the right upper lobe, possibly from malignancy or infection such as fungal pneumonia or staphylococcal pneumonia.  Adjacent pleural thickening noted. 2.  Increased bilateral interstitial accentuation with airspace opacity in the lingula and left lower lobe, possibly from pneumonia.   Original Report Authenticated By: Dellia Cloud, M.D.       1. Community acquired pneumonia   2. Hyponatremia       MDM  Fever with altered mentation. Need to consider pneumonia and urinary tract infection is likely sources of occult infection. Also, given his history of COPD, ABG will be obtained to rule out CO2 retention. Prior records are reviewed and  he had been submitted for altered mental status last month which was felt to represent a TIA.  X-ray looks like he has developed left lower lobe pneumonia and will be treated with Rocephin and Zithromax. Also, significant hyponatremia is noted with sodium of 120. This is a decrease from the last level on record which was 129. Hyponatremia could be related to infection or medication side effect. He'll be given IV hydration. ABG does not show any evidence of CO2 retention.  Case is discussed with Dr. Felipa Eth, who will come to the emergency department to admit the patient.  Dione Booze, MD 08/02/12 1357

## 2012-08-02 NOTE — Progress Notes (Signed)
PCP:   Minda Meo, MD   Chief Complaint:  Lethargy, unarousable, high fevers, cough and shortness of breath  HPI: This is an 78 year old Caucasian male with a fairly complicated past medical history including COPD, cavitary lesion in the right upper lobe being managed by pulmonology, history of MAI infection but thought to be quiet at this time, residing at a [facility] home with his wife, admitted in August 4 what appears to be atrial fibrillation and a TIA, with decision not to anticoagulate given recurrent falls. Mental status changes did resolve with probable TIA indicating expressive aphasia. MRI not performed secondary to cervical spine issues at the time. Patient also has a past medical history significant for coronary artery disease and chronic hypernatremia at baseline with baseline sodium ranging from 120-132. He was discharged at that time to rehabilitation facility [facility] [place], and discharged back to his independent living facility on this past Tuesday. 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 Presented with fevers, cough, some shortness of breath. Patient was initiated on a ten-day regimen of Levaquin at the time, and offered hospitalization per note however patient declined. Discharged back to independent living, where her appetite was stable, however patient has not been using his Advair or Symbicort. Continued to have fevers low grade of the weekend however this morning was barely arousable, could not take his medications, and presented to the emergency room for further evaluation and management. In the emergency room, patient did have a mild leukocytosis, fever of 102.31F, and chest x-ray indicating possibly a new left lower lobe pneumonia compared with previous chest x-ray obtained 2 days prior. This is complicated by continued hypernatremia with a value of 120, previous body 129. Patient was started on  normal saline IV fluids along with treatment for community-acquired pneumonia with Rocephin and azithromycin by emergency room physician. Patient hemodynamically stable.  Review of Systems:  Review of systems Limited from patient who is arousable but answers questioning questions with minimal words and responses are, denies any pain, overt chills but does have fevers, denies any shortness of breath yet he is coughing with a moist productive cough, denies chest pain, palpitations, nausea, vomiting, abdominal pain but admits to continued issues with chronic arthritic type discomfort.  Past Medical History: Past Medical History  Diagnosis Date  . Allergic rhinitis   . 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 COPD (chronic obstructive pulmonary disease)   . Diverticula of colon   . Right leg DVT 01/2010  . IHD (ischemic heart disease)   . Hyperlipidemia   . Hypertension   . Depression   . Myalgia   . Hypothyroidism   . Bronchiectasis   . Spinal stenosis   . History of heartburn   . Joint pain   . 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 Coronary artery disease    Past Surgical History  Procedure Date  . Cardiac catheterization 06/13/2000    normal. ef 60%  . Cardiovascular stress test 04/2010    ef 54%, and normal perfusion  . Coronary angioplasty 1991    1st diagonal  . Carpal tunnel release     right  . Rotator cuff repair     left shoulder  . Knee surgery August  2006    S/P right knee replacement  . Laminectomy     2005  . Cataract extraction     Bilateral  Medications: Prior to Admission medications   Medication Sig Start Date End Date Taking? Authorizing Provider  acetaminophen (TYLENOL) 500 MG tablet Take 500 mg by mouth every 6 (six) hours as needed. pain   Yes Historical Provider, MD  aspirin 325 MG tablet Take 325 mg by mouth daily.   Yes Historical Provider, MD  diltiazem (CARDIZEM CD) 300 MG 24 hr capsule Take 300 mg by mouth  daily.    Yes Historical Provider, MD  famotidine (PEPCID) 20 MG tablet Take 20 mg by mouth at bedtime.   Yes Historical Provider, MD  fluticasone (FLONASE) 50 MCG/ACT nasal spray Place 2 sprays into the nose 2 (two) times daily.   Yes Historical Provider, MD  Fluticasone-Salmeterol (ADVAIR DISKUS) 250-50 MCG/DOSE AEPB Inhale 1 puff into the lungs every 12 (twelve) hours.   Yes Historical Provider, MD  guaiFENesin (MUCINEX) 600 MG 12 hr tablet Take 1,200 mg by mouth 2 (two) times daily.   Yes Historical Provider, MD  levofloxacin (LEVAQUIN) 500 MG tablet Take 500 mg by mouth daily. 07/31/12 08/10/12 Yes Tammy S Parrett, NP  levothyroxine (SYNTHROID, LEVOTHROID) 50 MCG tablet Take 50 mcg by mouth daily.     Yes Historical Provider, MD  meloxicam (MOBIC) 7.5 MG tablet Take 7.5 mg by mouth 2 (two) times daily.   Yes Historical Provider, MD  memantine (NAMENDA) 10 MG tablet Take 10 mg by mouth 2 (two) times daily.   Yes Historical Provider, MD  Multiple Vitamins-Minerals (PRESERVISION AREDS) CAPS Take 1 capsule by mouth daily.   Yes Historical Provider, MD  nitroGLYCERIN (NITROSTAT) 0.4 MG SL tablet Place 0.4 mg under the tongue every 5 (five) minutes as needed. May repeat x3   Yes Historical Provider, MD    Allergies:   Allergies  Allergen Reactions  . Sulfonamide Derivatives Other (See Comments)    unknown  . Penicillins Rash    Social History:  reports that he quit smoking about 59 years ago. His smoking use included Cigarettes. He has a 25 pack-year smoking history. He has never used smokeless tobacco. He reports that he does not drink alcohol or use illicit drugs. Patient currently resides [facility] [place], but recently discharged from rehabilitation at the same facility within the last one week.  Family History: Family History  Problem Relation Age of Onset  . Heart disease Father   . Heart failure Father   . Breast cancer Maternal Aunt     Physical  Exam: Filed Vitals:   08/02/12 1400 08/02/12 1430 08/02/12 1500 08/02/12 1530  BP: 113/69 117/75 106/62 110/66  Pulse:  93 81 81  Temp:      TempSrc:      Resp: 20 24 25 26   SpO2:  97% 95% 97%   vital signs as above however temperature 90.3 102.67F, respiratory rate improved compared with previous or admission to the emergency room General appearance-arousable but falls asleep quite readily, some simple one-word answers Sclera anicteric, extraocular movements appear intact however difficult to ascertain, face normal cephalic, atraumatic, No oropharyngeal lesions, possibly some postnasal drip, no cervical lymphadenopathy, neck supple Lungs reveal coarse breath sounds bilaterally with no overt rhonchi or wheezing Cardiovascular exam reveals a regular rate and rhythm with no murmurs appreciated Abdomen-soft, nontender, nondistended, bowel sounds present Extremities reveal intact pedal pulses, no cyanosis, no edema, onychomycosis present No axillary lymphadenopathy Neurologically the patient can move all 4 extremities, hand grip appears equal bilaterally, light touch appears intact however difficult to ascertain again given  patient's mental status, no resting tremors    Labs on Admission:   [facility] 08/02/12 1215  NA 120*  K 3.6  CL 87*  CO2 19  GLUCOSE 82  BUN 15  CREATININE 1.13  CALCIUM 8.1*  MG --  PHOS --    Basename 08/02/12 1215  AST 24  ALT 9  ALKPHOS 60  BILITOT 0.5  PROT 7.0  ALBUMIN 2.6*   No results found for this basename: LIPASE:2,AMYLASE:2 in the last 72 hours  Basename 08/02/12 1215  WBC 12.1*  NEUTROABS 9.4*  HGB 11.0*  HCT 30.6*  MCV 92.4  PLT 243   No results found for this basename: CKTOTAL:3,CKMB:3,CKMBINDEX:3,TROPONINI:3 in the last 72 hours No results found for this basename: TSH,T4TOTAL,FREET3,T3FREE,THYROIDAB in the last 72 hours No results found for this basename: VITAMINB12:2,FOLATE:2,FERRITIN:2,TIBC:2,IRON:2,RETICCTPCT:2 in the last 72  hours  Radiological Exams on Admission: Dg Chest 2 View  07/31/2012  *RADIOLOGY REPORT*  Clinical Data: Productive cough with fever.  CHEST - 2 VIEW  Comparison: Chest radiographs 05/01/2012 and 07/04/2012.  Findings: Cavitary right upper lobe lesion is again noted.  Air fluid level within this cavitary lesion has increased in height. The surrounding right apical parenchymal density appears about the same.  There is stable bilateral hilar retraction.  Calcified lymph nodes are present in both hila.  There is diffuse nodularity of the interstitium which appears chronic.  No significant pleural effusion is seen.  IMPRESSION: Stable chronic lung disease and cavitary right upper lobe lesion. The air-fluid level within this cavitary lesion has increased, possibly secondary to superimposed infection or bleeding. Consider follow-up CT.   Original Report Authenticated By: Gerrianne Scale, M.D.    Dg Chest 2 View  07/04/2012  *RADIOLOGY REPORT*  Clinical Data: Chest pain, COPD.  CHEST - 2 VIEW  Comparison: 05/01/2012  Findings: Again noted is the cavitary lesion in the right upper lobe.  Previously seen air fluid level not visualized on the current study.  Chronic scarring in both upper lobes.  Heart is upper limits normal in size.  No effusions or acute airspace opacities.  IMPRESSION: Stable chronic upper lobe densities bilaterally compatible with scarring.  Cavitary lesion in the right upper lobe again noted.  No current air-fluid level as seen previously.  Original Report Authenticated By: Cyndie Chime, M.D.   Dg Thoracic Spine 2 View  07/06/2012  *RADIOLOGY REPORT*  Clinical Data: Fall with left sided pain.  THORACIC SPINE - 2 VIEW  Comparison: Chest radiograph 07/04/2012 and 10/01/2011.  Findings: Upper thoracic compression fracture and mild loss of vertebral body height in the mid thoracic spine are unchanged from 10/01/2011.  Alignment is anatomic.  Multilevel endplate degenerative changes are noted.   Incidental imaging of the lumbar spine shows grade 1 anterolisthesis of L3 on L4, stable. Degenerative changes in the lumbar spine are noted.  IMPRESSION:  1.  Thoracic spondylosis with stable compression fractures in the upper and mid thoracic spine. 2.  Lumbar spondylosis, better evaluated on dedicated imaging performed the same day.   Original Report Authenticated By: Reyes Ivan, M.D. ( 07/06/2012 08:31:44 )    Dg Lumbar Spine 2-3 Views  07/06/2012  *RADIOLOGY REPORT*  Clinical Data: Fall with left sided pain.  LUMBAR SPINE - 2-3 VIEW  Comparison: 10/13/2007.  Findings: On the lateral view, the lumbar spine is visualized from L2-S1.  Approximately 6 mm of anterolisthesis of L3 and L4, stable. Facet hypertrophy at multiple levels.  Endplate degenerative changes and loss of disc  space height are worst at L5-S1. Postoperative changes are seen in the spine.  Atherosclerotic calcification of the arterial vasculature.  IMPRESSION:  1.  Spondylosis, worst at L5-S1. 2.  Stable grade 1 anterolisthesis at L3-4.   Original Report Authenticated By: Reyes Ivan, M.D. ( 07/06/2012 16:10:96 )    Ct Head Wo Contrast  07/04/2012  *RADIOLOGY REPORT*  Clinical Data:  Fall, altered mental status.  CT HEAD WITHOUT CONTRAST CT CERVICAL SPINE WITHOUT CONTRAST  Technique:  Multidetector CT imaging of the head and cervical spine was performed following the standard protocol without intravenous contrast.  Multiplanar CT image reconstructions of the cervical spine were also generated.  Comparison:  10/15/2011.  CT cervical spine 12/30/2006  CT HEAD  Findings: There is atrophy and chronic small vessel disease changes.  No acute intracranial abnormality.  Specifically, no hemorrhage, hydrocephalus, mass lesion, acute infarction, or significant intracranial injury.  No acute calvarial abnormality.  Rounded soft tissue in the maxillary sinuses bilaterally, likely mucous retention cysts or polyps.  Mastoids are clear.   IMPRESSION: No acute intracranial abnormality.  Atrophy, chronic microvascular disease.  CT CERVICAL SPINE  Findings: Advanced degenerative disc disease throughout the cervical spine.  Moderate to advanced degenerative facet disease bilaterally.  Normal alignment.  Severe arthritic changes at the atlantoaxial articulation.  Severe erosions within the odontoid. No acute fracture.  Prevertebral soft tissues are normal.  No epidural or paraspinal hematoma.  Imaging into the apices demonstrates scarring in the upper lobes bilaterally.  Partially imaged is the cavitary lesions seen in the right upper lobe on prior chest radiographs.  IMPRESSION: Severe spondylosis.  No acute bony abnormality.  Original Report Authenticated By: Cyndie Chime, M.D.   Ct Cervical Spine Wo Contrast  07/04/2012  *RADIOLOGY REPORT*  Clinical Data:  Fall, altered mental status.  CT HEAD WITHOUT CONTRAST CT CERVICAL SPINE WITHOUT CONTRAST  Technique:  Multidetector CT imaging of the head and cervical spine was performed following the standard protocol without intravenous contrast.  Multiplanar CT image reconstructions of the cervical spine were also generated.  Comparison:  10/15/2011.  CT cervical spine 12/30/2006  CT HEAD  Findings: There is atrophy and chronic small vessel disease changes.  No acute intracranial abnormality.  Specifically, no hemorrhage, hydrocephalus, mass lesion, acute infarction, or significant intracranial injury.  No acute calvarial abnormality.  Rounded soft tissue in the maxillary sinuses bilaterally, likely mucous retention cysts or polyps.  Mastoids are clear.  IMPRESSION: No acute intracranial abnormality.  Atrophy, chronic microvascular disease.  CT CERVICAL SPINE  Findings: Advanced degenerative disc disease throughout the cervical spine.  Moderate to advanced degenerative facet disease bilaterally.  Normal alignment.  Severe arthritic changes at the atlantoaxial articulation.  Severe erosions within the  odontoid. No acute fracture.  Prevertebral soft tissues are normal.  No epidural or paraspinal hematoma.  Imaging into the apices demonstrates scarring in the upper lobes bilaterally.  Partially imaged is the cavitary lesions seen in the right upper lobe on prior chest radiographs.  IMPRESSION: Severe spondylosis.  No acute bony abnormality.  Original Report Authenticated By: Cyndie Chime, M.D.   Dg Chest Portable 1 View  08/02/2012  *RADIOLOGY REPORT*  Clinical Data: Fever.  Fatigue.  Hypoxia.  PORTABLE CHEST - 1 VIEW  Comparison: 07/31/2012  Findings: Cavitary lesion in the right upper lobe measures approximately 6.2 x 3.8 cm.  Previously seen internal air-fluid level is not readily apparent although given in semi erect positioning would not necessarily be apparent.  Adjacent  pleural thickening appears stable.  Hazy interstitial opacity is present throughout both lungs, increased from prior.  Airspace opacity obscures the left heart border and left lung base, new compared to prior.  There is evidence of old granulomatous disease.  Atherosclerotic calcification of the aortic arch noted.  IMPRESSION:  1. Essentially stable in size cavitary lesion in the right upper lobe, possibly from malignancy or infection such as fungal pneumonia or staphylococcal pneumonia.  Adjacent pleural thickening noted. 2.  Increased bilateral interstitial accentuation with airspace opacity in the lingula and left lower lobe, possibly from pneumonia.   Original Report Authenticated By: Dellia Cloud, M.D.    Orders placed in visit on 07/30/12  . EKG 12-LEAD    Assessment/Plan Active Problems: PNA-increasing left lower lobe airspace disease, compared with previous chest x-ray from 2 days ago, associated with high fever, some respiratory distress, as well as leukocytosis of 12.5 thousand. Rocephin and azithromycin are distorted in the emergency room, we'll continue the same, we'll followup on chest x-ray in the morning,  but have low threshold for involving pulmonology given the patient's cavitary lesion with questionable airspace disease. Patient is established with Dr. Sherene Sires.   BACTEREMIA, MYCOBACTERIUM AVIUM COMPLEX-see discussion above, review of recent pulmonology records reveals that the patient has been thought to have an asymptomatic infection oral findings from the past at this point. We'll need to watch carefully.  ALLERGIC RHINITIS-no evidence of overt sinusitis at this time, postnasal drip is present with clear rhinitis, will continue current medications as prescribed at home.  COPD-significant issues for her albuterol nebulizer treatments as needed in addition to the patient's Advair, if patient unable to tolerate Advair or develop significant bronchospasm may need to initiate intravenous steroids.  GERD-currently on an H2 blocker may need to escalate her proton pump inhibitor if indicated. CBC stable, abdominal exam benign.  HTN (hypertension)-blood pressure reasonable, currently on calcium channel blocker with history of atrial fibrillation, currently appears to be in a sinus rhythm based on telemetry as well as physical exam, renal parameters reason  Mental status change-presumably secondary to significant infection, will continue to follow up, no focal or localizing neurological deficits, history of TIA, in the setting of atrial fibrillation, with anticoagulation deferred secondary to recurrent falls. Hyponatremia, chronic issues with baseline sodium ranging from 120-132 based on previous records, currently 120, will track and monitor overnight on saline administration. Hypothyroidism-we'll continue home dose of thyroid medication, and deferred thyroid function testing monitoring to an outpatient basis unless patient destabilizes for   Ciearra Rufo R 08/02/2012, 3:43 PM

## 2012-08-02 NOTE — ED Notes (Signed)
WUJ:[id number]<JY> Expected date:08/02/12<BR> Expected time:11:02 AM<BR> Means of arrival:Ambulance<BR> Comments:<BR> Lethargy, fever

## 2012-08-02 NOTE — ED Notes (Signed)
5 Engineer, materials informed that no one could take report due to a code situation on the floor.

## 2012-08-03 ENCOUNTER — Inpatient Hospital Stay (HOSPITAL_COMMUNITY): Payer: Medicare Other

## 2012-08-03 LAB — CBC
MCH: 35.5 pg — ABNORMAL HIGH (ref 26.0–34.0)
MCHC: 36.8 g/dL — ABNORMAL HIGH (ref 30.0–36.0)
Platelets: 247 10*3/uL (ref 150–400)
RDW: 14.2 % (ref 11.5–15.5)

## 2012-08-03 LAB — COMPREHENSIVE METABOLIC PANEL
ALT: 10 U/L (ref 0–53)
AST: 24 U/L (ref 0–37)
CO2: 18 mEq/L — ABNORMAL LOW (ref 19–32)
Chloride: 89 mEq/L — ABNORMAL LOW (ref 96–112)
Creatinine, Ser: 1.03 mg/dL (ref 0.50–1.35)
GFR calc non Af Amer: 64 mL/min — ABNORMAL LOW (ref >90)
Total Bilirubin: 0.4 mg/dL (ref 0.3–1.2)

## 2012-08-03 MED ORDER — LIP MEDEX EX OINT
TOPICAL_OINTMENT | CUTANEOUS | Status: AC
  Administered 2012-08-03: 01:00:00
  Filled 2012-08-03: qty 7

## 2012-08-03 MED ORDER — POTASSIUM CHLORIDE IN NACL 20-0.9 MEQ/L-% IV SOLN
INTRAVENOUS | Status: DC
  Administered 2012-08-03 – 2012-08-09 (×10): via INTRAVENOUS
  Filled 2012-08-03 (×15): qty 1000

## 2012-08-03 MED ORDER — POTASSIUM CHLORIDE CRYS ER 20 MEQ PO TBCR
40.0000 meq | EXTENDED_RELEASE_TABLET | Freq: Once | ORAL | Status: AC
  Administered 2012-08-03: 40 meq via ORAL
  Filled 2012-08-03: qty 2

## 2012-08-03 NOTE — Progress Notes (Signed)
OT Note:  Order received and chart reviewed.  Noted pt is scheduled for a CT to r/o CVA.  Will check back as schedule permits.  [place], OTR/L [phone] 08/03/2012

## 2012-08-03 NOTE — Progress Notes (Signed)
CSW [place] LM for Pt's wife.  CSW Luther Parody spoke with [facility] rep, Revonda Standard, who stated that Pt can return to SNF when ready.  [place]idence Crosby, LCSWA Clinical Social Work [phone]

## 2012-08-03 NOTE — Plan of Care (Signed)
Problem: Phase II Progression Outcomes Goal: Activity at appropriate level-compared to baseline (UP IN CHAIR FOR HEMODIALYSIS)  Outcome: Not Progressing Due to lethargy

## 2012-08-03 NOTE — Clinical Documentation Improvement (Signed)
CHANGE MENTAL STATUS DOCUMENTATION CLARIFICATION   THIS DOCUMENT IS NOT A PERMANENT PART OF THE MEDICAL RECORD  TO RESPOND TO THE THIS QUERY, FOLLOW THE INSTRUCTIONS BELOW:  1. If needed, update documentation for the patient's encounter via the notes activity.  2. Access this query again and click edit on the In [employer].  3. After updating, or not, click F2 to complete all highlighted (required) fields concerning your review. Select "additional documentation in the medical record" OR "no additional documentation provided".  4. Click Sign note button.  5. The deficiency will fall out of your In Basket *Please let us know if you are not able to complete this workflow by phone or e-mail (listed below).         08/03/12  Dear Dr. Wylene Simmer, R/Associates  In an effort to better capture your patient's severity of illness, reflect appropriate length of stay and utilization of resources, a review of the patient medical record has revealed the following indicators.    Based on your clinical judgment, please clarify and document in a progress note and/or discharge summary the clinical condition associated with the following supporting information:  In responding to this query please exercise your independent judgment.  The fact that a query is asked, does not imply that any particular answer is desired or expected.  Pt admitted with MAC-PNA.  According to the HP pt experience a mental status change -presumably secondary to significant infection.    Please clarify if mental status changes can be further specified as one of the the diagnoses listed below and document in the pn or d/c summary.    Possible Clinical Conditions?  _______Encephalopathy (describe type if known)                       Anoxic                       Septic                       Alcoholic                        Hepatic                       Hypertensive                       Metabolic  _______ Toxic  _______Other Condition__________________ _______Cannot Clinically Determine   Supporting Information: HP Mental status change-presumably secondary to significant infection, will continue to follow up,   PN 08/03/12 He now has worsened mental status.  White count lower, afebrile, so will maintain current.  If worsens, will consult Pulm.  He is going down for a repeat CXR this AM. 2) Altered mental status.  Multifactorial.   PN 08/03/12 taken to the [facility] ER due to AMS, not willing to take PO's and confusion.  Essentially unchanged at this early point of his hospitalization.  PN 08/03/12 Hyponatremia- Given 120, likely cause of AMS.  Hydration, limit free water, also may be related to PNA, specifically H. Flu can worsen this  Risk Factors:  Signs & Symptoms:  Diagnostics: Component     Latest Ref Rng 08/02/2012         4:20 PM  WBC     4.0 - 10.5 K/uL 11.8 (H)    CXR: 08/02/12 IMPRESSION:  1.  Essentially stable in size cavitary lesion in the right upper lobe, possibly from malignancy or infection such as fungal pneumonia or staphylococcal pneumonia.  Adjacent pleural thickening noted. 2.  Increased bilateral interstitial accentuation with airspace opacity in the lingula and left lower lobe, possibly from pneumonia.     Treatment: Rocephin Azithromycin Advair  Reviewed:  no additional documentation provided  Thank You,  Enis Slipper RN, BSN, MSN/Inf, CCDS Clinical Documentation Specialist [facility] Dept Pager: [phone] / E-mail: [email or web address] Health Information Management [place]

## 2012-08-03 NOTE — Evaluation (Signed)
Physical Therapy Evaluation Patient Details Name: [name] MRN: [id number] DOB: 1924/03/27 Today's Date: 08/03/2012 Time: 4098-1191 PT Time Calculation (min): 16 min  PT Assessment / Plan / Recommendation Clinical Impression  Pt admitted with pneumonia.  Pt would benefit from acute PT services in order to improve strength and independence with transfers and ambulation.  Pt had just finished rehab and moved back into indpendent living apt with spouse prior to admission.  Evaluation limited due to poor cognitive status as pt very lethargic today.    PT Assessment  Patient needs continued PT services    Follow Up Recommendations  Skilled nursing facility    Barriers to Discharge        Equipment Recommendations  Defer to next venue    Recommendations for Other Services     Frequency Min 3X/week    Precautions / Restrictions Precautions Precautions: Fall   Pertinent Vitals/Pain No pain      Mobility  Bed Mobility Bed Mobility: Supine to Sit;Sit to Supine Supine to Sit: 1: +2 Total assist Supine to Sit: Patient Percentage: 10% Sit to Supine: 1: +2 Total assist Sit to Supine: Patient Percentage: 10% Details for Bed Mobility Assistance: verbal cues for technique, pt requires assist for movement, only able to hold onto rail Transfers Transfers: Not assessed    Exercises     PT Diagnosis: Difficulty walking;Generalized weakness  PT Problem List: Decreased strength;Decreased activity tolerance;Decreased mobility;Decreased balance;Decreased safety awareness;Decreased knowledge of use of DME;Decreased cognition PT Treatment Interventions: DME instruction;Gait training;Functional mobility training;Therapeutic exercise;Therapeutic activities;Balance training;Neuromuscular re-education;Patient/family education   PT Goals Acute Rehab PT Goals PT Goal Formulation: With patient/family Time For Goal Achievement: 08/17/12 Potential to Achieve Goals: Fair Pt will go Supine/Side  to Sit: with min assist PT Goal: Supine/Side to Sit - Progress: Goal set today Pt will go Sit to Supine/Side: with min assist PT Goal: Sit to Supine/Side - Progress: Goal set today Pt will go Sit to Stand: with min assist PT Goal: Sit to Stand - Progress: Goal set today Pt will go Stand to Sit: with min assist PT Goal: Stand to Sit - Progress: Goal set today Pt will Ambulate: with min assist;with least restrictive assistive device;51 - 150 feet PT Goal: Ambulate - Progress: Goal set today  Visit Information  Last PT Received On: 08/03/12 Assistance Needed: +2    Subjective Data  Subjective: I'll try.   Prior Functioning  Home Living Lives With: Spouse Type of Home: Independent living facility Additional Comments: Pt from [facility] and had just finished rehab and returned to apt with spouse approx 4 days prior to readmission per son. Prior Function Level of Independence: Independent with assistive device(s) Communication Communication: No difficulties    Cognition  Overall Cognitive Status: Difficult to assess Difficult to assess due to: Level of arousal Arousal/Alertness: Lethargic Behavior During Session: Lethargic Cognition - Other Comments: pt very lethargic but able to open eyes and answer yes/no questions    Extremity/Trunk Assessment Right Lower Extremity Assessment RLE ROM/Strength/Tone: Deficits RLE ROM/Strength/Tone Deficits: grossly less than 2+/5 throughout as pt unable to assist with movement Left Lower Extremity Assessment LLE ROM/Strength/Tone: Deficits LLE ROM/Strength/Tone Deficits: grossly less than 2+/5 throughout as pt unable to assist with movement   Balance Balance Balance Assessed: Yes Static Sitting Balance Static Sitting - Balance Support: Feet supported;Bilateral upper extremity supported Static Sitting - Level of Assistance: 2: Max assist Static Sitting - Comment/# of Minutes: pt was able to sit with trunk unsupported for approx 30  sec  however fatigued quickly requiring max assist  End of Session PT - End of Session Activity Tolerance: Patient limited by fatigue Patient left: in bed;with call bell/phone within reach;with family/visitor present  GP     Cherril Hett,[name] 08/03/2012, 12:00 PM Pager: [phone]

## 2012-08-03 NOTE — Progress Notes (Signed)
Subjective: I had seen Mr. Hassey last month at the time of his admission for embolic CVA/TIA.  He is reasonably well known to me and I am covering for Dr. Jacky Kindle this week.  He did not rest very well last night.  No calls received on call for meds.  This AM, he opens his eyes and can talk, but cannot say where he is still.  Wife and son in room, concerned.  He still has a heavy sounding cough as well.  They had been offered admission by Pulm on Friday at an OV, but declined, and his health worsened to this state over the weekend.  Was taken to the [facility] ER due to AMS, not willing to take PO's and confusion.  Essentially unchanged at this early point of his hospitalization.  Objective: Vital signs in last 24 hours: Temp:  [98.5 F (36.9 C)-102.5 F (39.2 C)] 98.5 F (36.9 C) (09/16 0636) Pulse Rate:  [78-96] 96  (09/16 0636) Resp:  [20-28] 20  (09/15 2222) BP: (97-125)/(61-81) 121/81 mmHg (09/16 0636) SpO2:  [91 %-100 %] 98 % (09/16 0636) FiO2 (%):  [100 %] 100 % (09/15 1138) Weight:  [80.151 kg (176 lb 11.2 oz)] 80.151 kg (176 lb 11.2 oz) (09/15 1753) Weight change:  Last BM Date: 07/30/12  Intake/Output from previous day: 09/15 0701 - 09/16 0700 In: 1127.1 [I.V.:1127.1] Out: 350 [Urine:350] Intake/Output this shift:    General appearance: alert, appears stated age, no distress and slowed mentation Neck: no adenopathy, no carotid bruit, no JVD, supple, symmetrical, trachea midline and thyroid not enlarged, symmetric, no tenderness/mass/nodules Resp: diminished breath sounds bibasilar Cardio: irregularly irregular rhythm GI: soft, non-tender; bowel sounds normal; no masses,  no organomegaly Extremities: extremities normal, atraumatic, no cyanosis or edema Neurologic: Mental status: Alert, oriented to: person,  difficulty naming circumstance or location.  No focal movement changes or weakness.  Lab Results:  Basename 08/03/12 0524 08/02/12 1620  WBC 9.2 11.8*  HGB 10.3* 10.3*   HCT 28.0* 28.8*  PLT 247 236   BMET  Basename 08/03/12 0524 08/02/12 1620 08/02/12 1215  NA 120* -- 120*  K 3.4* -- 3.6  CL 89* -- 87*  CO2 18* -- 19  GLUCOSE 93 -- 82  BUN 13 -- 15  CREATININE 1.03 1.01 --  CALCIUM 7.4* -- 8.1*    Studies/Results: Dg Chest Portable 1 View  08/02/2012  *RADIOLOGY REPORT*  Clinical Data: Fever.  Fatigue.  Hypoxia.  PORTABLE CHEST - 1 VIEW  Comparison: 07/31/2012  Findings: Cavitary lesion in the right upper lobe measures approximately 6.2 x 3.8 cm.  Previously seen internal air-fluid level is not readily apparent although given in semi erect positioning would not necessarily be apparent.  Adjacent pleural thickening appears stable.  Hazy interstitial opacity is present throughout both lungs, increased from prior.  Airspace opacity obscures the left heart border and left lung base, new compared to prior.  There is evidence of old granulomatous disease.  Atherosclerotic calcification of the aortic arch noted.  IMPRESSION:  1. Essentially stable in size cavitary lesion in the right upper lobe, possibly from malignancy or infection such as fungal pneumonia or staphylococcal pneumonia.  Adjacent pleural thickening noted. 2.  Increased bilateral interstitial accentuation with airspace opacity in the lingula and left lower lobe, possibly from pneumonia.   Original Report Authenticated By: Dellia Cloud, M.D.     Medications:  I have reviewed the patient's current medications. Scheduled:   . aspirin  325 mg Oral Daily  .  azithromycin (ZITHROMAX) 500 MG IVPB  500 mg Intravenous Once  . cefTRIAXone (ROCEPHIN) IVPB 1 gram/50 mL D5W  1 g Intravenous Q24H  . diltiazem  300 mg Oral Daily  . enoxaparin (LOVENOX) injection  30 mg Subcutaneous Q24H  . famotidine  20 mg Oral QHS  . fluticasone  2 spray Each Nare BID  . Fluticasone-Salmeterol  1 puff Inhalation Q12H  . guaiFENesin  1,200 mg Oral BID  . influenza  inactive virus vaccine  0.5 mL Intramuscular  Tomorrow-1000  . levothyroxine  50 mcg Oral Q breakfast  . lip balm      . memantine  10 mg Oral BID  . potassium chloride  40 mEq Oral Once  . sodium chloride  1,000 mL Intravenous Once  . sodium chloride  3 mL Intravenous Q12H  . DISCONTD: sodium chloride   Intravenous Once   Continuous:   . 0.9 % NaCl with KCl 20 mEq / L    . DISCONTD: sodium chloride 75 mL/hr at 08/03/12 0522   ZOX:[id number], albuterol, nitroGLYCERIN  Assessment/Plan: 1) Pneumonia- Rocephin/Azithromycin as failed on Levaquin as outpatient, which had been given by Pulm when family refused admission on Friday.  He now has worsened mental status.  White count lower, afebrile, so will maintain current.  If worsens, will consult Pulm.  He is going down for a repeat CXR this AM. 2) Altered mental status.  Multifactorial.  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 Afib, will check CT to see if he had a new event, in which case, this may need to be reconsidered.  See low Na below as well. 3) Afib- Not an anticoagulation candidate and rate controlled at this point. 4) Hyponatremia- Given 120, likely cause of AMS.  Hydration, limit free water, also may be related to PNA, specifically H. Flu can worsen this. 5) Hypokalemia-  Will replace orally this AM and add to his IVF. 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 8) HTN- Controlled 9) Hypothyroidism- Continue Synthroid  LOS: 1 day   TISOVEC,RICHARD W 08/03/2012, 8:00 AM

## 2012-08-03 NOTE — Progress Notes (Signed)
LM for Pt's wife.  Amanda Mubashir Mallek, LCSWA Clinical Social Work [phone]  

## 2012-08-03 NOTE — Progress Notes (Signed)
OT Note:  PT saw pt today.  Pt needed A x 2, pt did 10% of getting to EOB.  Will check back another day. [place], OTR/L [phone] 08/03/2012

## 2012-08-04 LAB — CBC
Platelets: 234 10*3/uL (ref 150–400)
RDW: 14.3 % (ref 11.5–15.5)
WBC: 9 10*3/uL (ref 4.0–10.5)

## 2012-08-04 LAB — COMPREHENSIVE METABOLIC PANEL
AST: 22 U/L (ref 0–37)
Albumin: 2.1 g/dL — ABNORMAL LOW (ref 3.5–5.2)
Alkaline Phosphatase: 63 U/L (ref 39–117)
Chloride: 93 mEq/L — ABNORMAL LOW (ref 96–112)
Potassium: 3.8 mEq/L (ref 3.5–5.1)
Total Bilirubin: 0.4 mg/dL (ref 0.3–1.2)

## 2012-08-04 MED ORDER — GUAIFENESIN 200 MG PO TABS
600.0000 mg | ORAL_TABLET | Freq: Four times a day (QID) | ORAL | Status: DC
  Administered 2012-08-04 – 2012-08-09 (×19): 600 mg via ORAL
  Filled 2012-08-04 (×28): qty 3

## 2012-08-04 MED ORDER — GUAIFENESIN 200 MG PO TABS
600.0000 mg | ORAL_TABLET | Freq: Four times a day (QID) | ORAL | Status: DC
  Filled 2012-08-04 (×2): qty 3

## 2012-08-04 MED ORDER — ENOXAPARIN SODIUM 40 MG/0.4ML ~~LOC~~ SOLN
40.0000 mg | SUBCUTANEOUS | Status: DC
  Administered 2012-08-04 – 2012-08-10 (×7): 40 mg via SUBCUTANEOUS
  Filled 2012-08-04 (×8): qty 0.4

## 2012-08-04 NOTE — Evaluation (Signed)
Occupational Therapy Evaluation Patient Details Name: [name] MRN: [id number] DOB: 1924/10/13 Today's Date: 08/04/2012 Time: 1140-1210 OT Time Calculation (min): 30 min  OT Assessment / Plan / Recommendation Clinical Impression  Pt is an 78 yo male who presents with PNA. Limited by lethargy and decreased LOA. Skilled OT indicated to maximize independence with BADLs to decrease burden at next venue of care.    OT Assessment  Patient needs continued OT Services    Follow Up Recommendations  Skilled nursing facility    Barriers to Discharge Decreased caregiver support;Inaccessible home environment    Equipment Recommendations  Defer to next venue    Recommendations for Other Services    Frequency  Min 1X/week    Precautions / Restrictions Precautions Precautions: Fall Restrictions Weight Bearing Restrictions: No   Pertinent Vitals/Pain Pt did not appear to be in pain or distress.    ADL  Eating/Feeding: Performed;Maximal assistance;Other (comment) (HOH A needed to hold and steady cup.) Grooming: Simulated;Maximal assistance Where Assessed - Grooming: Supported sitting Toilet Transfer: Chief of Staff: Patient Percentage: 20% Statistician Method: Sit to stand (recliner.) ADL Comments: Pt extremely lethargic. Unable to remain alert until mobilized to EOB.    OT Diagnosis: Generalized weakness  OT Problem List: Decreased cognition;Decreased safety awareness;Decreased activity tolerance;Impaired balance (sitting and/or standing);Decreased knowledge of use of DME or AE OT Treatment Interventions: Self-care/ADL training;Therapeutic activities;DME and/or AE instruction;Patient/family education   OT Goals Acute Rehab OT Goals OT Goal Formulation: With family Time For Goal Achievement: 08/18/12 Potential to Achieve Goals: Fair ADL Goals Pt Will Perform Eating: with set-up;Sitting, chair;Supported;Supine, head of bed up ADL Goal: Eating -  Progress: Goal set today Pt Will Perform Grooming: with set-up;Sitting, chair;Supine, head of bed up ADL Goal: Grooming - Progress: Goal set today Pt Will Transfer to Toilet: with mod assist;Squat pivot transfer;Stand pivot transfer;3-in-1 ADL Goal: Toilet Transfer - Progress: Goal set today Additional ADL Goal #1: Pt will complete supine to sit with mod A and tolerate sitting EOB unsupported with SBA x8 min in prep for seated ADL. ADL Goal: Additional Goal #1 - Progress: Goal set today Additional ADL Goal #2: Pt will tolerate 20 min of TA with min VCs to maintain attention to task for ADLs. ADL Goal: Additional Goal #2 - Progress: Goal set today  Visit Information  Last OT Received On: 08/04/12 Assistance Needed: +3 or more PT/OT Co-Evaluation/Treatment: Yes    Subjective Data  Subjective: Pt made minimal verbalizations throughout eval 2* lethargy. Patient Stated Goal: Pt unable to state goal.   Prior Functioning  Vision/Perception  Home Living Lives With: Spouse Type of Home: Independent living facility Additional Comments: Pt from [facility] and just finished rehab and returned home with spouse approx 4 days PTA. Prior Function Level of Independence: Independent with assistive device(s) Comments: Pt to d/c back [facility]. Communication Communication: No difficulties Dominant Hand: Right      Cognition  Overall Cognitive Status: Impaired Area of Impairment: Awareness of deficits;Awareness of errors;Safety/judgement Arousal/Alertness: Lethargic Orientation Level: Disoriented to;Place;Time;Situation Behavior During Session: Lethargic Cognition - Other Comments: more alert and awake today once sitting EOB and with transfer however closed eyes again once in recliner    Extremity/Trunk Assessment Right Upper Extremity Assessment RUE ROM/Strength/Tone: Unable to fully assess;Due to impaired cognition Left Upper Extremity Assessment LUE ROM/Strength/Tone: Unable to fully  assess;Due to impaired cognition   Mobility  Shoulder Instructions  Bed Mobility Bed Mobility: Supine to Sit Supine to Sit: 1: +2  Total assist Supine to Sit: Patient Percentage: 0% Details for Bed Mobility Assistance: pt did not assist with transfer likely due to lethargy however once up and EOB more awake and alert Transfers Sit to Stand: 1: +2 Total assist Sit to Stand: Patient Percentage: 20% Stand to Sit: 1: +2 Total assist Stand to Sit: Patient Percentage: 20% Details for Transfer Assistance: +3 for safety, increased verbal cues, pt able to take a few steps forward so recliner could be brought behind with increased assist       Exercise     Balance Balance Balance Assessed: Yes Static Sitting Balance Static Sitting - Balance Support: Bilateral upper extremity supported;Feet supported Static Sitting - Level of Assistance: 5: Stand by assistance Static Sitting - Comment/# of Minutes: able to sit EOB for 5 minutes with supervision   End of Session OT - End of Session Equipment Utilized During Treatment: Gait belt Activity Tolerance: Patient limited by fatigue Patient left: in chair;with call bell/phone within reach Nurse Communication: Need for lift equipment  GO     Akshar Starnes A OTR/L [phone] 08/04/2012, 1:53 PM

## 2012-08-04 NOTE — Progress Notes (Signed)
Clinical Social Work Department CLINICAL SOCIAL WORK PLACEMENT NOTE 08/04/2012  Patient:  SUFYAAN, BROWDER  Account Number:  [id number] Admit date:  08/02/2012  Clinical Social Worker:  Doroteo Glassman  Date/time:  08/04/2012 10:39 AM  Clinical Social Work is seeking post-discharge placement for this patient at the following level of care:   SKILLED NURSING   (*CSW will update this form in Epic as items are completed)   n/a  Patient/family provided with [facility] Department of Clinical Social Work's list of facilities offering this level of care within the geographic area requested by the patient (or if unable, by the patient's family).  n/a  Patient/family informed of their freedom to choose among providers that offer the needed level of care, that participate in Medicare, Medicaid or managed care program needed by the patient, have an available bed and are willing to accept the patient.  n/a  Patient/family informed of [facility]' ownership interest in [facility], as well as of the fact that they are under no obligation to receive care at this facility.  PASARR submitted to EDS on existing PASARR number received from EDS on existing  FL2 transmitted to all facilities in geographic area requested by pt/family on  08/04/12 FL2 transmitted to all facilities within larger geographic area on n/a  Patient informed that his/her managed care company has contracts with or will negotiate with  certain facilities, including the following:     Patient/family informed of bed offers received:  [facility] Patient chooses bed at  Physician recommends and patient chooses bed at    Patient to be transferred to  on   Patient to be transferred to facility by   The following physician request were entered in Epic:   Additional Comments:  Providence Crosby, Theresia Majors Clinical Social Work [phone]

## 2012-08-04 NOTE — Progress Notes (Signed)
Clinical Social Work Department BRIEF PSYCHOSOCIAL ASSESSMENT 08/04/2012  Patient:  [name], [name]     Account Number:  [id number]     Admit date:  08/02/2012  Clinical Social Worker:  Doroteo Glassman  Date/Time:  08/04/2012 10:09 AM  Referred by:  Physician  Date Referred:  08/04/2012 Referred for  SNF Placement   Other Referral:   Interview type:  Other - See comment Other interview type:   Spoke with Pt's wife.    PSYCHOSOCIAL DATA Living Status:  FACILITY Admitted from facility:  [facility] Level of care:  Independent Living Primary support name:  Mrs. Coberly Primary support relationship to patient:  SPOUSE Degree of support available:   adequate    CURRENT CONCERNS Current Concerns  Post-Acute Placement   Other Concerns:    SOCIAL WORK ASSESSMENT / PLAN CSW met with Pt's wife to discuss d/c plans.    Pt's wife agreeable to Pt going to SNF at [facility].    Pt's wife states that Pt was just d/c'd from SNF at [facility] and that he was only home for 1 wk prior to his current hospitalization.    CSW thanked Pt's wife for her time.   Assessment/plan status:  Psychosocial Support/Ongoing Assessment of Needs Other assessment/ plan:   Information/referral to community resources:   n/a    PATIENT'S/FAMILY'S RESPONSE TO PLAN OF CARE: Pt's wife thanked CSW for time and assistance.   CSW to continue to follow.  Providence Crosby, LCSWA Clinical Social Work [phone]

## 2012-08-04 NOTE — Progress Notes (Signed)
Physical Therapy Treatment Patient Details Name: [name] MRN: [id number] DOB: 09/02/1924 Today's Date: 08/04/2012 Time: 7829-5621 PT Time Calculation (min): 23 min  PT Assessment / Plan / Recommendation Comments on Treatment Session  Pt became more awake/alert upon sitting EOB however continues to require increased assist for all mobility and quickly went back to closed eyes/sleeping once in recliner.  RN in room attempting to give meds upon entering and having difficulty as pt not swallowing however once sitting EOB pt was able to take sips of tea from lunch tray.    Follow Up Recommendations  Skilled nursing facility    Barriers to Discharge        Equipment Recommendations  Defer to next venue    Recommendations for Other Services    Frequency     Plan Discharge plan remains appropriate;Frequency remains appropriate    Precautions / Restrictions Precautions Precautions: Fall Restrictions Weight Bearing Restrictions: No   Pertinent Vitals/Pain No pain    Mobility  Bed Mobility Bed Mobility: Supine to Sit Supine to Sit: 1: +2 Total assist Supine to Sit: Patient Percentage: 0% Details for Bed Mobility Assistance: pt did not assist with transfer likely due to lethargy however once up and EOB more awake and alert Transfers Transfers: Sit to Stand;Stand to Sit;Stand Pivot Transfers Sit to Stand: 1: +2 Total assist Sit to Stand: Patient Percentage: 20% Stand to Sit: 1: +2 Total assist Stand to Sit: Patient Percentage: 20% Stand Pivot Transfers: 1: +2 Total assist Stand Pivot Transfers: Patient Percentage: 20% Details for Transfer Assistance: +3 for safety, increased verbal cues, pt able to take a few steps forward so recliner could be brought behind with increased assist Ambulation/Gait Ambulation/Gait Assistance: Not tested (comment)    Exercises     PT Diagnosis:    PT Problem List:   PT Treatment Interventions:     PT Goals Acute Rehab PT Goals PT Goal:  Supine/Side to Sit - Progress: Progressing toward goal PT Goal: Sit to Stand - Progress: Progressing toward goal PT Goal: Stand to Sit - Progress: Progressing toward goal  Visit Information  Last PT Received On: 08/04/12 Assistance Needed: +3 or more    Subjective Data  Subjective: "that was good."  (when asked about transfer)   Cognition  Overall Cognitive Status: Impaired Area of Impairment: Awareness of deficits;Awareness of errors;Safety/judgement Arousal/Alertness: Lethargic Orientation Level: Disoriented to;Place;Time;Situation Behavior During Session: Lethargic Cognition - Other Comments: more alert and awake today once sitting EOB and with transfer however closed eyes again once in recliner    Balance  Balance Balance Assessed: Yes Static Sitting Balance Static Sitting - Balance Support: Bilateral upper extremity supported;Feet supported Static Sitting - Level of Assistance: 5: Stand by assistance Static Sitting - Comment/# of Minutes: able to sit EOB for 5 minutes with supervision  End of Session PT - End of Session Equipment Utilized During Treatment: Gait belt Activity Tolerance: Patient limited by fatigue Patient left: in chair;with call bell/phone within reach;with family/visitor present Nurse Communication: Need for lift equipment   GP     [name],[name] 08/04/2012, 1:10 PM Pager: [phone]

## 2012-08-04 NOTE — Progress Notes (Signed)
Subjective: Doing better today.  Alertness has improved and was able to identify place/time/circumstance as of last night per wife.  Eating breakfast this morning.  No pain, alert  Objective: Vital signs in last 24 hours: Temp:  [98.1 F (36.7 C)-99.3 F (37.4 C)] 98.1 F (36.7 C) (09/17 0603) Pulse Rate:  [69-92] 76  (09/17 0603) Resp:  [18-20] 18  (09/17 0603) BP: (99-135)/(65-95) 129/68 mmHg (09/17 0603) SpO2:  [94 %-100 %] 100 % (09/17 0603) Weight change:  Last BM Date: 08/03/12  Intake/Output from previous day: 09/16 0701 - 09/17 0700 In: 605 [P.O.:240; I.V.:365] Out: 1100 [Urine:1100] Intake/Output this shift:   General appearance: alert, appears stated age, no distress and slowed mentation  Neck: no adenopathy, no carotid bruit, no JVD, supple, symmetrical, trachea midline and thyroid not enlarged, symmetric, no tenderness/mass/nodules  Resp: diminished breath sounds bibasilar  Cardio: irregularly irregular rhythm  GI: soft, non-tender; bowel sounds normal; no masses, no organomegaly  Extremities: extremities normal, atraumatic, no cyanosis or edema  Neurologic: Mental status: Alert, oriented to person/place/time  Lab Results:  Basename 08/03/12 0524 08/02/12 1620  WBC 9.2 11.8*  HGB 10.3* 10.3*  HCT 28.0* 28.8*  PLT 247 236   BMET  Basename 08/03/12 0524 08/02/12 1620 08/02/12 1215  NA 120* -- 120*  K 3.4* -- 3.6  CL 89* -- 87*  CO2 18* -- 19  GLUCOSE 93 -- 82  BUN 13 -- 15  CREATININE 1.03 1.01 --  CALCIUM 7.4* -- 8.1*    Studies/Results: X-ray Chest Pa And Lateral   08/03/2012  *RADIOLOGY REPORT*  Clinical Data: Follow up pneumonia, cough, congestion  CHEST - 2 VIEW  Comparison: 08/02/2012  Findings: Enlarged cardiac silhouette. Diffuse left lung infiltrate persists. Cavitary lesion with air/fluid level in right upper lobe again identified, 6.4 x 4.2 x 4.3 cm. Improved aeration in remainder of right lung. Small bibasialr effusions.  IMPRESSION:  Persistent cavitary lesion right upper lobe question tumor versus infection/abscess; recommend assessment by CT chest (with contrast if renal function permits). Persistent left lung infiltrate and small bibasilar effusions.   Original Report Authenticated By: Lollie Marrow, M.D.    Ct Head Wo Contrast  08/03/2012  *RADIOLOGY REPORT*  Clinical Data: Altered mental status. Atrial fibrillation.  CT HEAD WITHOUT CONTRAST  Technique:  Contiguous axial images were obtained from the base of the skull through the vertex without contrast.  Comparison: 07/04/2012.  Findings: There is no evidence for acute infarction, intracranial hemorrhage, mass lesion, hydrocephalus, or extra-axial fluid. Moderate atrophy is present.  Chronic microvascular ischemic changes noted in the periventricular and subcortical white matter. Scattered lacunes.  No acute sinus disease.  Right mastoid fluid appears increased from priors.  IMPRESSION: No acute or focal intracranial abnormality.  Atrophy with chronic microvascular ischemic change.  Slight right mastoid fluid of uncertain significance.   Original Report Authenticated By: Elsie Stain, M.D.    Dg Chest Portable 1 View  08/02/2012  *RADIOLOGY REPORT*  Clinical Data: Fever.  Fatigue.  Hypoxia.  PORTABLE CHEST - 1 VIEW  Comparison: 07/31/2012  Findings: Cavitary lesion in the right upper lobe measures approximately 6.2 x 3.8 cm.  Previously seen internal air-fluid level is not readily apparent although given in semi erect positioning would not necessarily be apparent.  Adjacent pleural thickening appears stable.  Hazy interstitial opacity is present throughout both lungs, increased from prior.  Airspace opacity obscures the left heart border and left lung base, new compared to prior.  There is evidence  of old granulomatous disease.  Atherosclerotic calcification of the aortic arch noted.  IMPRESSION:  1. Essentially stable in size cavitary lesion in the right upper lobe, possibly from  malignancy or infection such as fungal pneumonia or staphylococcal pneumonia.  Adjacent pleural thickening noted. 2.  Increased bilateral interstitial accentuation with airspace opacity in the lingula and left lower lobe, possibly from pneumonia.   Original Report Authenticated By: Dellia Cloud, M.D.     Medications:  I have reviewed the patient's current medications. Scheduled:   . aspirin  325 mg Oral Daily  . cefTRIAXone (ROCEPHIN) IVPB 1 gram/50 mL D5W  1 g Intravenous Q24H  . diltiazem  300 mg Oral Daily  . enoxaparin (LOVENOX) injection  30 mg Subcutaneous Q24H  . famotidine  20 mg Oral QHS  . fluticasone  2 spray Each Nare BID  . Fluticasone-Salmeterol  1 puff Inhalation Q12H  . guaiFENesin  1,200 mg Oral BID  . influenza  inactive virus vaccine  0.5 mL Intramuscular Tomorrow-1000  . levothyroxine  50 mcg Oral Q breakfast  . memantine  10 mg Oral BID  . potassium chloride  40 mEq Oral Once  . sodium chloride  3 mL Intravenous Q12H   Continuous:   . 0.9 % NaCl with KCl 20 mEq / L 100 mL/hr at 08/03/12 2216  . DISCONTD: sodium chloride 75 mL/hr at 08/03/12 0522   JXB:[id number], albuterol, nitroGLYCERIN  Assessment/Plan: 1) Pneumonia- LLL, Rocephin/Azithromycin as failed on Levaquin as outpatient, which had been given by Pulm when family refused admission on Friday. Mental status improving.  However, there is no incentive spirometer in the room despite my order as of yesterday AM.  WIll reaffirm this by note. So they can do it as ordered. 2) Altered mental status. Multifactorial. No evidence of CVA on his CT and improving.  Likely metabolic in nature.  Na pending with labs this AM. 3) Afib- Not an anticoagulation candidate and rate controlled at this point.  4) Hyponatremia- Given 120, likely cause of AMS. Hydration, limit free water, also may be related to PNA, specifically H. Flu can worsen this. Labs pending, IVF. 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  8) HTN- Controlled  9) Hypothyroidism- Continue Synthroid   LOS: 2 days   [name] W 08/04/2012, 7:53 AM

## 2012-08-04 NOTE — Progress Notes (Signed)
Per Care Coordinator, Pt not ready for d/c today.  Notified facility.  FL2 on chart for MD signature.  CSW to continue to follow.  Providence Crosby, LCSWA Clinical Social Work [phone]

## 2012-08-05 ENCOUNTER — Inpatient Hospital Stay (HOSPITAL_COMMUNITY): Payer: Medicare Other

## 2012-08-05 LAB — COMPREHENSIVE METABOLIC PANEL
AST: 25 U/L (ref 0–37)
BUN: 10 mg/dL (ref 6–23)
CO2: 17 mEq/L — ABNORMAL LOW (ref 19–32)
Calcium: 7.8 mg/dL — ABNORMAL LOW (ref 8.4–10.5)
Creatinine, Ser: 0.86 mg/dL (ref 0.50–1.35)
GFR calc non Af Amer: 76 mL/min — ABNORMAL LOW (ref >90)

## 2012-08-05 LAB — CBC
MCH: 32.7 pg (ref 26.0–34.0)
MCV: 93.1 fL (ref 78.0–100.0)
Platelets: 214 10*3/uL (ref 150–400)
RBC: 3.03 MIL/uL — ABNORMAL LOW (ref 4.22–5.81)
RDW: 14.5 % (ref 11.5–15.5)

## 2012-08-05 MED ORDER — FUROSEMIDE 10 MG/ML IJ SOLN
20.0000 mg | Freq: Every day | INTRAMUSCULAR | Status: DC
Start: 2012-08-05 — End: 2012-08-07
  Administered 2012-08-05 – 2012-08-06 (×2): 20 mg via INTRAVENOUS
  Filled 2012-08-05 (×3): qty 2

## 2012-08-05 NOTE — Progress Notes (Signed)
Physical Therapy Treatment Patient Details Name: [name] MRN: [id number] DOB: 02-Mar-1924 Today's Date: 08/05/2012 Time: 1000-1037 PT Time Calculation (min): 37 min  PT Assessment / Plan / Recommendation Comments on Treatment Session  Pt needs consant encouragement for participation with PT.  He responded well to active exercise and needs continued work on bed [employer]. Recommend use of lift equipment per nursing, either stedy or maximove depending on patient's level of alertness    Follow Up Recommendations  Skilled nursing facility    Barriers to Discharge        Equipment Recommendations  Defer to next venue    Recommendations for Other Services    Frequency Min 3X/week   Plan Discharge plan remains appropriate;Frequency remains appropriate    Precautions / Restrictions     Pertinent Vitals/Pain Nasal O2 maintained.    Mobility  Bed Mobility Bed Mobility: Supine to Sit Supine to Sit: 1: +2 Total assist Supine to Sit: Patient Percentage: 10% Details for Bed Mobility Assistance: pt is rigid, with decreased leg flexion to assist with rolling Transfers Transfers: Sit to Stand;Stand to Sit Sit to Stand: 1: +2 Total assist Sit to Stand: Patient Percentage: 30% Stand to Sit: 1: +2 Total assist Stand to Sit: Patient Percentage: 30% Transfer via Lift Equipment: Stedy Details for Transfer Assistance: Pt was able to assist with sit to stand  and repeated activity ~ 5 times to strengthen and improve trunk extension.  He has decreased endurance to activity and fatigues easily Ambulation/Gait Ambulation/Gait Assistance: Not tested (comment) (Stedy used for transfer;trunk extension encouraged)    Exercises General Exercises - Lower Extremity Ankle Circles/Pumps: AAROM;Both;5 reps;Supine Long Arc Quad: AAROM;Both;5 reps;Seated Heel Slides: AAROM;Both;5 reps;Supine Hip ABduction/ADduction: AROM;Both;5 reps;Supine   PT Diagnosis:    PT Problem List:   PT Treatment  Interventions:     PT Goals Acute Rehab PT Goals PT Goal: Supine/Side to Sit - Progress: Progressing toward goal PT Goal: Sit to Supine/Side - Progress: Progressing toward goal PT Goal: Sit to Stand - Progress: Progressing toward goal PT Goal: Stand to Sit - Progress: Progressing toward goal  Visit Information  Last PT Received On: 08/05/12 Assistance Needed: +2    Subjective Data  Subjective: "How long can he sit up?"  per son Patient Stated Goal: son wants pt to get to chair   Cognition  Overall Cognitive Status: Impaired Area of Impairment: Awareness of deficits;Awareness of errors;Safety/judgement Arousal/Alertness: Lethargic Orientation Level: Disoriented to;Place;Time;Situation Behavior During Session: Lethargic Cognition - Other Comments: needed continued stimulation to stay awake    Balance  Balance Balance Assessed: Yes Static Sitting Balance Static Sitting - Balance Support: Bilateral upper extremity supported;Feet supported Static Sitting - Level of Assistance: 5: Stand by assistance Static Sitting - Comment/# of Minutes: 5 sitting on EOB  End of Session PT - End of Session Equipment Utilized During Treatment: Oxygen Activity Tolerance: Patient limited by fatigue Patient left: in chair;with family/visitor present;Other (comment) (maximove pad in place) Nurse Communication: Need for lift equipment   GP     Rosey Bath K. [place], [place] [phone] 08/05/2012, 3:40 PM

## 2012-08-05 NOTE — Progress Notes (Signed)
Subjective: Small issues this AM.  When I came in, his IV was not hooked up and running despite his need to get his Na up.  His wife indicates that it was not running last night either.  His IV came out.  There is no notation of this from his nurse from last night, who is now gone from the building or any notation of a call to call coverage to ok this.  I was also told that he can't do the Incentive Spirometer for some reason, despite his wife noting that he has had several from prior hospitalizations at home and has used before.  There is no notation about this either and the device is out of reach of the patient.    He was woken up by me and remains oriented.  Noted PT/OT and Soc wk notes, FL-2 is signed.  Objective: Vital signs in last 24 hours: Temp:  [97.5 F (36.4 C)-99.2 F (37.3 C)] 98.6 F (37 C) (09/18 0555) Pulse Rate:  [74-88] 88  (09/18 0555) Resp:  [16-18] 16  (09/18 0555) BP: (102-122)/(60-73) 102/60 mmHg (09/18 0555) SpO2:  [96 %-99 %] 96 % (09/18 0555) Weight change:  Last BM Date: 08/04/12  Intake/Output from previous day: 09/17 0701 - 09/18 0700 In: -  Out: 650 [Urine:650] Intake/Output this shift:   General appearance: alert, appears stated age, no distress and slowed mentation  Neck: no adenopathy, no carotid bruit, no JVD, supple, symmetrical, trachea midline and thyroid not enlarged, symmetric, no tenderness/mass/nodules  Resp: diminished breath sounds bibasilar  Cardio: irregularly irregular rhythm  GI: soft, non-tender; bowel sounds normal; no masses, no organomegaly  Extremities: extremities normal, atraumatic, no cyanosis or edema  Neurologic: Mental status: Alert, oriented to person/place/time   Lab Results:  Basename 08/05/12 0510 08/04/12 0918  WBC 10.0 9.0  HGB 9.9* 10.6*  HCT 28.2* 28.7*  PLT 214 234   BMET  Basename 08/05/12 0510 08/04/12 0918  NA 121* 123*  K 4.0 3.8  CL 91* 93*  CO2 17* 18*  GLUCOSE 104* 125*  BUN 10 11  CREATININE  0.86 0.96  CALCIUM 7.8* 7.8*    Studies/Results: X-ray Chest Pa And Lateral   08/03/2012  *RADIOLOGY REPORT*  Clinical Data: Follow up pneumonia, cough, congestion  CHEST - 2 VIEW  Comparison: 08/02/2012  Findings: Enlarged cardiac silhouette. Diffuse left lung infiltrate persists. Cavitary lesion with air/fluid level in right upper lobe again identified, 6.4 x 4.2 x 4.3 cm. Improved aeration in remainder of right lung. Small bibasialr effusions.  IMPRESSION: Persistent cavitary lesion right upper lobe question tumor versus infection/abscess; recommend assessment by CT chest (with contrast if renal function permits). Persistent left lung infiltrate and small bibasilar effusions.   Original Report Authenticated By: Lollie Marrow, M.D.    Ct Head Wo Contrast  08/03/2012  *RADIOLOGY REPORT*  Clinical Data: Altered mental status. Atrial fibrillation.  CT HEAD WITHOUT CONTRAST  Technique:  Contiguous axial images were obtained from the base of the skull through the vertex without contrast.  Comparison: 07/04/2012.  Findings: There is no evidence for acute infarction, intracranial hemorrhage, mass lesion, hydrocephalus, or extra-axial fluid. Moderate atrophy is present.  Chronic microvascular ischemic changes noted in the periventricular and subcortical white matter. Scattered lacunes.  No acute sinus disease.  Right mastoid fluid appears increased from priors.  IMPRESSION: No acute or focal intracranial abnormality.  Atrophy with chronic microvascular ischemic change.  Slight right mastoid fluid of uncertain significance.   Original Report Authenticated  By: Elsie Stain, M.D.     Medications:  I have reviewed the patient's current medications. Scheduled:   . aspirin  325 mg Oral Daily  . cefTRIAXone (ROCEPHIN) IVPB 1 gram/50 mL D5W  1 g Intravenous Q24H  . diltiazem  300 mg Oral Daily  . enoxaparin (LOVENOX) injection  40 mg Subcutaneous Q24H  . famotidine  20 mg Oral QHS  . fluticasone  2 spray  Each Nare BID  . Fluticasone-Salmeterol  1 puff Inhalation Q12H  . guaiFENesin  600 mg Oral Q6H  . levothyroxine  50 mcg Oral Q breakfast  . memantine  10 mg Oral BID  . sodium chloride  3 mL Intravenous Q12H  . DISCONTD: enoxaparin (LOVENOX) injection  30 mg Subcutaneous Q24H  . DISCONTD: guaiFENesin  1,200 mg Oral BID  . DISCONTD: guaiFENesin  600 mg Oral Q6H   Continuous:   . 0.9 % NaCl with KCl 20 mEq / L 100 mL/hr at 08/04/12 1623   ZOX:[id number], albuterol, nitroGLYCERIN  Assessment/Plan: 1) Pneumonia- LLL, Rocephin/Azithromycin as failed on Levaquin as outpatient, which had been given by Pulm when family refused admission on Friday. Mental status improving. However,IS use seems "impossible"   Will have RT see patient and explain why since nursing has not accomplished this.  Out of bed to expand lungs. 2) Altered mental status. Multifactorial. No evidence of CVA on his CT and improving. Likely metabolic in nature.Na lower this AM due to failure of IVF to be maintained.  I made it clear to his day nurse that this is not acceptable and they must find a solution within the hour (new IV placement if needed- he still has one in his L wrist, presumably it's not good but has not been removed). 3) Afib- Not an anticoagulation candidate and rate controlled at this point.  4) Hyponatremia- Given 121, likely cause of AMS. Hydration, limit free water, also may be related to PNA, specifically H. Flu can worsen this. Labs pending, IVF. See above 5) Hypokalemia- Replaced Monday, ok today.  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  8) HTN- Controlled  9) Hypothyroidism- Continue Synthroid   LOS: 3 days   [name] 08/05/2012, 7:51 AM

## 2012-08-05 NOTE — Progress Notes (Signed)
Per RN, Pt not medically stable for d/c.  LM for [facility] stating that Pt is not ready for d/c, as Florentina Addison in the SNF at [facility] is off until Monday.  CSW to continue to follow.  Providence Crosby, LCSWA Clinical Social Work [phone]

## 2012-08-06 LAB — COMPREHENSIVE METABOLIC PANEL
ALT: 18 U/L (ref 0–53)
AST: 34 U/L (ref 0–37)
Alkaline Phosphatase: 62 U/L (ref 39–117)
CO2: 19 mEq/L (ref 19–32)
Chloride: 94 mEq/L — ABNORMAL LOW (ref 96–112)
GFR calc Af Amer: 84 mL/min — ABNORMAL LOW (ref >90)
GFR calc non Af Amer: 72 mL/min — ABNORMAL LOW (ref >90)
Glucose, Bld: 105 mg/dL — ABNORMAL HIGH (ref 70–99)
Potassium: 4.1 mEq/L (ref 3.5–5.1)
Sodium: 124 mEq/L — ABNORMAL LOW (ref 135–145)
Total Bilirubin: 0.3 mg/dL (ref 0.3–1.2)

## 2012-08-06 LAB — CBC
Hemoglobin: 9.7 g/dL — ABNORMAL LOW (ref 13.0–17.0)
MCH: 33.7 pg (ref 26.0–34.0)
RBC: 2.88 MIL/uL — ABNORMAL LOW (ref 4.22–5.81)
WBC: 8.5 10*3/uL (ref 4.0–10.5)

## 2012-08-06 NOTE — Progress Notes (Signed)
Subjective: Patient awakens alert no distress seems to recognize me right away. Denies any chest pain or shortness of breath. Does have an occasional cough while speaking. Intake is fair. Still poorly motivated to move around according to nursing.  Objective: Vital signs in last 24 hours: Temp:  [98.3 F (36.8 C)-98.5 F (36.9 C)] 98.5 F (36.9 C) (09/19 0526) Pulse Rate:  [65-72] 65  (09/19 0526) Resp:  [18] 18  (09/19 0526) BP: (93-103)/(61-70) 103/70 mmHg (09/19 0526) SpO2:  [95 %-100 %] 98 % (09/19 0526) Weight change:   CBG (last 3)  No results found for this basename: GLUCAP:3 in the last 72 hours  Intake/Output from previous day: 09/18 0701 - 09/19 0700 In: 6270 [P.O.:70; I.V.:6200] Out: 1550 [Urine:1550]  Physical Exam: Patient is awake alert no distress. Good facial symmetry no JVD or bruits. Bitemporal wasting. Chest relatively clear prolonged expiratory phase no rales or rhonchi perhaps a mild rub on the left difficult to reproduce but overall clear. Cardiovascular is irregular rate in the 80s no murmur. Distant heart sounds. Abdomen is soft and nontender. Back is kyphotic. Extremities without edema intact pulses. Patient is awake alert knows me a bit confused regarding circumstances nose is at [place] long.   Lab Results:  [facility] 08/06/12 0434 08/05/12 0510  NA 124* 121*  K 4.1 4.0  CL 94* 91*  CO2 19 17*  GLUCOSE 105* 104*  BUN 12 10  CREATININE 0.98 0.86  CALCIUM 7.8* 7.8*  MG -- --  PHOS -- --    Basename 08/06/12 0434 08/05/12 0510  AST 34 25  ALT 18 12  ALKPHOS 62 64  BILITOT 0.3 0.4  PROT 6.1 6.1  ALBUMIN 1.9* 1.9*    Basename 08/06/12 0434 08/05/12 0510  WBC 8.5 10.0  NEUTROABS -- --  HGB 9.7* 9.9*  HCT 27.6* 28.2*  MCV 95.8 93.1  PLT 254 214   Lab Results  Component Value Date   INR 1.10 07/04/2012   INR 2.19* 05/03/2010   INR 2.29* 05/02/2010   No results found for this basename: CKTOTAL:3,CKMB:3,CKMBINDEX:3,TROPONINI:3 in the last  72 hours No results found for this basename: TSH,T4TOTAL,FREET3,T3FREE,THYROIDAB in the last 72 hours No results found for this basename: VITAMINB12:2,FOLATE:2,FERRITIN:2,TIBC:2,IRON:2,RETICCTPCT:2 in the last 72 hours  Studies/Results: Dg Chest 2 View  08/05/2012  *RADIOLOGY REPORT*  Clinical Data: Follow up pneumonia.  CHEST - 2 VIEW  Comparison: Two-view chest 08/03/2012.  Findings: A cavitary lesion in the right upper lobe persists.    It appears slightly smaller than on the prior exam.  Left upper lobe airspace disease is more prominent than on the prior study. Bilateral effusions are present.  The heart is enlarged.  Left basilar airspace disease has increased.  IMPRESSION:  1.  Stable slight decrease in the size of a cavitary lesion of the right upper lobe. 2.  Increasing left upper and left lower lobe airspace disease. 3.  Bilateral pleural effusions are more prominent, left greater than right.   Original Report Authenticated By: Jamesetta Orleans. MATTERN, M.D.      Assessment/Plan: #1 left lower lobe pneumonia in the setting of chronicMAC and probable bronchiectasis clearly seems stable from a pulmonary standpoint her current antibiotics  #2 hyponatremia slowly improving with volume or graft #3 altered mental status multifactorial but getting back close to baseline  #3 chronic atrial fibrillation rate controlled not anticoagulation candidate  #4 essential hypertension stable  #5 hypothyroidism stable  Plan [facility] the [facility]  one to 2 days depending upon settling out of his sodium. Will obtain a rest a pulmonary consult regarding the upper lobe lesion cavitary believed to be chronic and believe pulmonary has workup and is following so will not 20 further workup until they have ruled on this.   LOS: 4 days   Toryn Mcclinton A 08/06/2012, 7:11 AM

## 2012-08-06 NOTE — Progress Notes (Signed)
Physical Therapy Treatment Patient Details Name: [name] MRN: [id number] DOB: Dec 25, 1923 Today's Date: 08/06/2012 Time: 1000-1026 PT Time Calculation (min): 26 min  PT Assessment / Plan / Recommendation Comments on Treatment Session  Continue to recommend SNF.     Follow Up Recommendations  Skilled nursing facility    Barriers to Discharge        Equipment Recommendations  Defer to next venue    Recommendations for Other Services    Frequency Min 3X/week   Plan Discharge plan remains appropriate    Precautions / Restrictions Precautions Precautions: Fall Restrictions Weight Bearing Restrictions: No   Pertinent Vitals/Pain     Mobility  Bed Mobility Bed Mobility: Supine to Sit Supine to Sit: 1: +2 Total assist Supine to Sit: Patient Percentage: 20% Details for Bed Mobility Assistance: Multimodal cues for safety, technique, hand placement. Assist for trunk to upright and bil LEs off bed. Utilized bed pad to assist with scooting, positioning Transfers Transfers: Sit to Stand;Stand to Sit Sit to Stand: 1: +2 Total assist Sit to Stand: Patient Percentage: 30% Stand to Sit: 1: +2 Total assist Stand to Sit: Patient Percentage: 30% Transfer via Lift Equipment: Stedy Details for Transfer Assistance: Multimodal cues for safety, technique, hand placement. Assist to position feet, rise, stabilize, control descent. Facilitation to encourage trunk extension. Pt able to stand for 2-3 minutes at at time. Sit>stand x 3. Stedy for bed>chair.  Ambulation/Gait Ambulation/Gait Assistance: Not tested (comment)    Exercises General Exercises - Lower Extremity Ankle Circles/Pumps: AAROM;Both;5 reps;Seated Long Arc Quad: AAROM;Both;10 reps;Seated   PT Diagnosis:    PT Problem List:   PT Treatment Interventions:     PT Goals Acute Rehab PT Goals Pt will go Supine/Side to Sit: with min assist PT Goal: Supine/Side to Sit - Progress: Progressing toward goal Pt will go Sit to  Stand: with min assist PT Goal: Sit to Stand - Progress: Progressing toward goal Pt will go Stand to Sit: with min assist PT Goal: Stand to Sit - Progress: Progressing toward goal  Visit Information  Last PT Received On: 08/06/12 Assistance Needed: +2    Subjective Data  Subjective: "I'm okay" Patient Stated Goal: None stated   Cognition       Balance     End of Session PT - End of Session Equipment Utilized During Treatment: Oxygen Activity Tolerance: Patient limited by fatigue Patient left: in chair;with call bell/phone within reach;with family/visitor present Nurse Communication: Need for lift equipment   GP     [name] 08/06/2012, 3:10 PM [phone]

## 2012-08-06 NOTE — Progress Notes (Addendum)
Chart review.  Noted from MD prog this note this morning that Pt not ready for d/c today.  Met with Pt and his wife.  Pt's wife understands from RN that Pt not ready for d/c today due to his Na level.  Provided emotional support to Pt's wife.  CSW thanked Mrs. Levinson for her time.  CSW to continue to follow.  Providence Crosby, LCSWA Clinical Social Work [phone].  11:41: T/c with [facility] at [facility].  Notified Mrs. Eanes of possible d/c tomorrow.  Confirmed receipt of documents sent yesterday.  CSW thanked Mrs. Eanes for her time.  Providence Crosby, LCSWA Clinical Social Work [phone]

## 2012-08-07 ENCOUNTER — Inpatient Hospital Stay (HOSPITAL_COMMUNITY): Payer: Medicare Other

## 2012-08-07 DIAGNOSIS — J189 Pneumonia, unspecified organism: Secondary | ICD-10-CM

## 2012-08-07 DIAGNOSIS — J471 Bronchiectasis with (acute) exacerbation: Secondary | ICD-10-CM

## 2012-08-07 LAB — COMPREHENSIVE METABOLIC PANEL
ALT: 23 U/L (ref 0–53)
Albumin: 2 g/dL — ABNORMAL LOW (ref 3.5–5.2)
Alkaline Phosphatase: 66 U/L (ref 39–117)
Calcium: 8 mg/dL — ABNORMAL LOW (ref 8.4–10.5)
GFR calc non Af Amer: 75 mL/min — ABNORMAL LOW (ref >90)
Potassium: 4.3 mEq/L (ref 3.5–5.1)
Sodium: 122 mEq/L — ABNORMAL LOW (ref 135–145)
Total Protein: 6.3 g/dL (ref 6.0–8.3)

## 2012-08-07 LAB — CBC
MCH: 35.9 pg — ABNORMAL HIGH (ref 26.0–34.0)
MCHC: 36.8 g/dL — ABNORMAL HIGH (ref 30.0–36.0)
RDW: 14.8 % (ref 11.5–15.5)

## 2012-08-07 MED ORDER — PANTOPRAZOLE SODIUM 40 MG IV SOLR
40.0000 mg | Freq: Two times a day (BID) | INTRAVENOUS | Status: DC
  Administered 2012-08-07 – 2012-08-09 (×6): 40 mg via INTRAVENOUS
  Filled 2012-08-07 (×8): qty 40

## 2012-08-07 MED ORDER — LEVALBUTEROL HCL 0.63 MG/3ML IN NEBU
0.6300 mg | INHALATION_SOLUTION | RESPIRATORY_TRACT | Status: DC | PRN
  Filled 2012-08-07: qty 3

## 2012-08-07 MED ORDER — VANCOMYCIN HCL IN DEXTROSE 1-5 GM/200ML-% IV SOLN
1000.0000 mg | Freq: Two times a day (BID) | INTRAVENOUS | Status: DC
  Administered 2012-08-07 – 2012-08-10 (×7): 1000 mg via INTRAVENOUS
  Filled 2012-08-07 (×7): qty 200

## 2012-08-07 MED ORDER — LEVALBUTEROL HCL 0.63 MG/3ML IN NEBU
0.6300 mg | INHALATION_SOLUTION | Freq: Four times a day (QID) | RESPIRATORY_TRACT | Status: DC
  Administered 2012-08-07 – 2012-08-10 (×12): 0.63 mg via RESPIRATORY_TRACT
  Filled 2012-08-07 (×16): qty 3

## 2012-08-07 MED ORDER — ACETAMINOPHEN 650 MG RE SUPP
650.0000 mg | RECTAL | Status: DC | PRN
  Administered 2012-08-07: 650 mg via RECTAL
  Filled 2012-08-07: qty 1

## 2012-08-07 MED ORDER — IPRATROPIUM BROMIDE 0.02 % IN SOLN
0.5000 mg | Freq: Four times a day (QID) | RESPIRATORY_TRACT | Status: DC
  Administered 2012-08-07 – 2012-08-10 (×12): 0.5 mg via RESPIRATORY_TRACT
  Filled 2012-08-07 (×12): qty 2.5

## 2012-08-07 MED ORDER — LEVALBUTEROL HCL 1.25 MG/0.5ML IN NEBU
1.2500 mg | INHALATION_SOLUTION | Freq: Four times a day (QID) | RESPIRATORY_TRACT | Status: DC
  Administered 2012-08-07 (×2): 1.25 mg via RESPIRATORY_TRACT
  Filled 2012-08-07 (×7): qty 0.5

## 2012-08-07 MED ORDER — PIPERACILLIN-TAZOBACTAM 3.375 G IVPB 30 MIN
3.3750 g | Freq: Four times a day (QID) | INTRAVENOUS | Status: DC
  Administered 2012-08-07 – 2012-08-08 (×4): 3.375 g via INTRAVENOUS
  Filled 2012-08-07 (×6): qty 50

## 2012-08-07 NOTE — Progress Notes (Signed)
Chart review.  Per MD, Pt not ready for d/c today.  Notified [facility] at [facility].  Per Nedra Hai, facility does not typically take weekend admissions.  Facility able to receive Pt next week.  Providence Crosby, LCSWA Clinical Social Work [phone]

## 2012-08-07 NOTE — Progress Notes (Signed)
ANTIBIOTIC CONSULT NOTE - INITIAL  Pharmacy Consult for Vancomycin Indication: pneumonia  Allergies  Allergen Reactions  . Sulfonamide Derivatives Other (See Comments)    unknown  . Penicillins Rash    Patient Measurements: Height: 6' (182.9 cm) Weight: 176 lb 11.2 oz (80.151 kg) IBW/kg (Calculated) : 77.6   Vital Signs: Temp: 98.6 F (37 C) (09/20 0700) Temp src: Oral (09/20 0700) BP: 126/79 mmHg (09/20 0700) Pulse Rate: 85  (09/20 0700) Intake/Output from previous day: 09/19 0701 - 09/20 0700 In: 2656.9 [P.O.:480; I.V.:1922.9; IV Piggyback:254] Out: 4650 [Urine:4650] Intake/Output from this shift:    Labs:  Basename 08/07/12 0440 08/06/12 0434 08/05/12 0510  WBC 8.7 8.5 10.0  HGB 9.8* 9.7* 9.9*  PLT 262 254 214  LABCREA -- -- --  CREATININE 0.90 0.98 0.86   Estimated Creatinine Clearance: 64.7 ml/min (by C-G formula based on Cr of 0.9).  Microbiology: Recent Results (from the past 720 hour(s))  CULTURE, BLOOD (ROUTINE X 2)     Status: Normal (Preliminary result)   Collection Time   08/02/12 12:15 PM      Component Value Range Status Comment   Specimen Description BLOOD RIGHT AC   Final    Special Requests BOTTLES DRAWN AEROBIC AND ANAEROBIC 6 CC EACH   Final    Culture  Setup Time 08/02/2012 21:28   Final    Culture     Final    Value:        BLOOD CULTURE RECEIVED NO GROWTH TO DATE CULTURE WILL BE HELD FOR 5 DAYS BEFORE ISSUING A FINAL NEGATIVE REPORT   Report Status PENDING   Incomplete   CULTURE, BLOOD (ROUTINE X 2)     Status: Normal (Preliminary result)   Collection Time   08/02/12 12:23 PM      Component Value Range Status Comment   Specimen Description BLOOD RIGHT ARM   Final    Special Requests BOTTLES DRAWN AEROBIC AND ANAEROBIC 6 CC EACH   Final    Culture  Setup Time 08/02/2012 21:28   Final    Culture     Final    Value:        BLOOD CULTURE RECEIVED NO GROWTH TO DATE CULTURE WILL BE HELD FOR 5 DAYS BEFORE ISSUING A FINAL NEGATIVE REPORT   Report Status PENDING   Incomplete     Medical History: Past Medical History  Diagnosis Date  . Allergic rhinitis   . 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 COPD (chronic obstructive pulmonary disease)   . Diverticula of colon   . Right leg DVT 01/2010  . IHD (ischemic heart disease)   . Hyperlipidemia   . Hypertension   . Depression   . Myalgia   . Hypothyroidism   . Bronchiectasis   . Spinal stenosis   . History of heartburn   . Joint pain   . 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 Coronary artery disease     Assessment: 53 YOM with left lower lobe pneumonia in the setting of chronic MAC with worsening right upper lobe cavitary lesion.  Patient has received 5 days of ceftriaxone, now to expand coverage to vancomycin and zoysn.  Of note, patient has a listed allergy to penicillin (reaction = rash); however, has been tolerating the cephalosporin and Dr. Jacky Kindle wishes to proceed with monitoring.   CrCl~64 ml/min (CG), ~60 ml/min (normalized).  Goal of Therapy:  Vancomycin trough level 15-20 mcg/ml  Plan:  Vancomycin 1g IV q12h. Continue Zosyn 3.375g IV q8h (4 hour infusion time). F/u SCr and trough level.  Clance Boll 08/07/2012,7:38 AM

## 2012-08-07 NOTE — Progress Notes (Addendum)
SLP Cancellation Note  Treatment cancelled today due to medical issues with patient which prohibited therapy.  Pt not alert enough for po intake - wife interviewed and states pt has not had dysphagia prior to admission.  Reviewed results of esophagram from 2007 with pt small hiatal hernia, marked reflux and mild to mod esophageal dilatation- with normal swallow function.     RN reports pt unable to swallow Tylenol - now to get Tylenol via suppository due to fever.    SLP to return today if pt awakens adequately for po (asked RN to page SLP) or will follow up next date for evaluation.  Wife understands pt should not be eating when lethargic.  Donavan Burnet, MS [facility] SLP [phone]

## 2012-08-07 NOTE — Consult Note (Signed)
Name: [name] MRN: [id number] DOB: November 07, 1924    LOS: 5  Referring Provider:  Rowe [name] Reason for Referral:  Fever, decreased LOC. FTT, pna.  PULMONARY / CRITICAL CARE MEDICINE  HPI: 78 yo WM [facility]. [facility] Afib, pna and AMS. He was seen  9/13 by Pulmonary(TP) and treated for resp infection with Levaquin and mucinex. Of note the family declined hospital admission at that time. Subsequently his mental status declined and his WOB increased. He was admitted(9-15) and tx for CAP with Roc/Zithro. He had improved but on 9/20 his mental status declined and he was started on Vanco/Zoysn and PCCM was consulted. Of note  fever 102 prior to admit and defervesced until 9/20 back up to 101.  Sleeping ok without nocturnal  or early am exacerbation  of respiratory  c/o's or need for noct saba. 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 Allergic rhinitis   . 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 COPD (chronic obstructive pulmonary disease)   . Diverticula of colon   . Right leg DVT 01/2010  . IHD (ischemic heart disease)   . Hyperlipidemia   . Hypertension   . Depression   . Myalgia   . Hypothyroidism   .  Bronchiectasis   . Spinal stenosis   . History of heartburn   . Joint pain   . 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 Coronary artery disease    Past Surgical History  Procedure Date  . Cardiac catheterization 06/13/2000    normal. ef 60%  . Cardiovascular stress test 04/2010    ef 54%, and normal perfusion  . Coronary angioplasty 1991    1st diagonal  . Carpal tunnel release     right  . Rotator cuff repair     left shoulder  . Knee surgery August  2006    S/P right knee replacement  . Laminectomy     2005  . Cataract extraction     Bilateral    Prior to Admission medications   Medication Sig Start Date End Date Taking? Authorizing Provider  acetaminophen (TYLENOL) 500 MG tablet Take 500 mg by mouth every 6 (six) hours as needed. pain   Yes Historical Provider, MD  aspirin 325 MG tablet Take 325 mg by mouth daily.   Yes Historical Provider, MD  diltiazem (CARDIZEM CD) 300 MG 24 hr capsule Take 300 mg by mouth daily.    Yes Historical Provider, MD  famotidine (PEPCID) 20 MG tablet Take 20 mg by mouth at bedtime.   Yes Historical Provider, MD  fluticasone (FLONASE) 50 MCG/ACT nasal spray Place 2 sprays into the nose 2 (two) times daily.   Yes Historical Provider, MD  Fluticasone-Salmeterol (ADVAIR DISKUS) 250-50 MCG/DOSE AEPB Inhale 1 puff into the lungs every 12 (twelve) hours.   Yes Historical  Provider, MD  guaiFENesin (MUCINEX) 600 MG 12 hr tablet Take 1,200 mg by mouth 2 (two) times daily.   Yes Historical Provider, MD  levofloxacin (LEVAQUIN) 500 MG tablet Take 500 mg by mouth daily. 07/31/12 08/10/12 Yes [name] S Parrett, NP  levothyroxine (SYNTHROID, LEVOTHROID) 50 MCG tablet Take 50 mcg by mouth daily.     Yes Historical Provider, MD  meloxicam (MOBIC) 7.5 MG tablet Take 7.5 mg by mouth 2 (two) times daily.   Yes Historical Provider, MD  memantine (NAMENDA) 10 MG tablet Take 10 mg by mouth 2 (two) times daily.   Yes Historical Provider, MD  Multiple  Vitamins-Minerals (PRESERVISION AREDS) CAPS Take 1 capsule by mouth daily.   Yes Historical Provider, MD  nitroGLYCERIN (NITROSTAT) 0.4 MG SL tablet Place 0.4 mg under the tongue every 5 (five) minutes as needed. May repeat x3   Yes Historical Provider, MD   Allergies Allergies  Allergen Reactions  . Sulfonamide Derivatives Other (See Comments)    unknown  . Penicillins Rash    Family History Family History  Problem Relation Age of Onset  . Heart disease Father   . Heart failure Father   . Breast cancer Maternal Aunt    Social History  reports that he quit smoking about 59 years ago. His smoking use included Cigarettes. He has a 25 pack-year smoking history. He has never used smokeless tobacco. He reports that he does not drink alcohol or use illicit drugs.  Review Of Systems:  ROS  The following are not active complaints unless bolded sore throat, dysphagia, dental problems, itching, sneezing,  nasal congestion or excess/ purulent secretions, ear ache,   fever, chills, sweats, unintended wt loss, pleuritic or exertional cp, hemoptysis,  orthopnea pnd or leg swelling, presyncope, palpitations, heartburn, abdominal pain, anorexia, nausea, vomiting, diarrhea  or change in bowel or urinary habits, change in stools or urine, dysuria,hematuria,  rash, arthralgias, visual complaints, headache, numbness weakness or ataxia or problems with walking or coordination,  change in mood/affect or memory.      Brief patient description:  EWM poorly responsive  Events Since Admission: 9/20 worsening chest x ray and mental decline.  Current Status: NAD but lethargic  Vital Signs: Temp:  [98.6 F (37 C)-100.9 F (38.3 C)] 100.9 F (38.3 C) (09/20 1220) Pulse Rate:  [69-85] 85  (09/20 0700) Resp:  [18-28] 28  (09/20 1055) BP: (110-129)/(68-79) 126/79 mmHg (09/20 0700) SpO2:  [95 %-98 %] 98 % (09/20 1205) FIO2  2lpm  Physical Examination: General:  [name] elderly WM Neuro: Wd's to pain,  arouses to strong voice commands.  HEENT:  Neg LAN. Oral mucosa dry Neck:  No JVD Cardiovascular:  HSR RRR Lungs: decreased tru out with l base crackles Abdomen:  +bs Musculoskeletal:  intact Skin: multiple ecchymotic areas.   Active Problems:  BACTEREMIA, MYCOBACTERIUM AVIUM COMPLEX  ALLERGIC RHINITIS  COPD  GERD  HTN (hypertension)  Dyspnea  Mental status change  Dg Chest 2 View  08/07/2012  *RADIOLOGY REPORT*  Clinical Data: History of lymphoma, COPD, hypertension  CHEST - 2 VIEW  Comparison: Chest x-ray of 08/05/2012  Findings: The lungs appear better aerated.  However there is airspace disease scattered throughout the left lung field suspicious for pneumonia.  The right lung apex is difficult to assess but there is a persistent opacity which unfortunately is overlain by the chin and not well evaluated.  There may be small effusions present.  Heart size is mildly enlarged.  IMPRESSION:  1.  Airspace disease throughout the left lung suspicious for pneumonia. 2.  The opacity in the right upper lung field is not as well seen as noted above but persists.   Original Report Authenticated By: Juline Patch, M.D.     Lab 08/07/12 0440 08/06/12 0434 08/05/12 0510  NA 122* 124* 121*  K 4.3 4.1 4.0  CL 91* 94* 91*  CO2 20 19 17*  BUN 13 12 10   CREATININE 0.90 0.98 0.86  GLUCOSE 102* 105* 104*    Lab 08/07/12 0440 08/06/12 0434 08/05/12 0510  HGB 9.8* 9.7* 9.9*  HCT 26.6* 27.6* 28.2*  WBC 8.7 8.5 10.0  PLT 262 254 214    BNP (last 3 results)  Basename 05/15/12 1045  PROBNP 125.0*   ABG    Component Value Date/Time   PHART 7.461* 08/02/2012 1150   PCO2ART 25.6* 08/02/2012 1150   PO2ART 81.9 08/02/2012 1150   HCO3 18.0* 08/02/2012 1150   TCO2 16.3 08/02/2012 1150   ACIDBASEDEF 4.3* 08/02/2012 1150   O2SAT 95.7 08/02/2012 1150   Micro: 9/15 bcx 2>> ABX:  9/20 van>> 9/20 zoysn>>  ASSESSMENT AND PLAN  Hypoxia, fever in setting of presumed HCAI with recent 3 weeks in rehab  post Afib, AMS, resp infection. >agree with V/z to tx HCAI >change BD to nebulizer he is unable to use MDI >bid PPI IV >HOB >20 degrees > add trial of vest 08/07/12    AMS in setting of hx of Afib without anticoagulation but CT head 9/16 negative. >  AMS most likely secondary to infective process.  Hyponatremia  Lab 08/07/12 0440 08/06/12 0434 08/05/12 0510  NA 122* 124* 121*   euovolemic so may have element of siadh >fluid restriction >Monitor Na  History of Afib >check 12 lead  Generalized FTT >Per IM    Brett Canales Minor ACNP [facility] PCCM Pager [phone] till 3 pm If no answer page [phone] 08/07/2012, 1:06 PM  Pt independently  seen and examined and available cxr's reviewed and I agree with above findings/ imp/ plan   Would also consider superimposed aspiration pna and eval swallowing once ams improves  Sandrea Hughs, MD Pulmonary and Critical Care Medicine [facility] Cell [phone]

## 2012-08-07 NOTE — Progress Notes (Signed)
Physical Therapy Treatment Patient Details Name: [name] MRN: [id number] DOB: 23-Jan-1924 Today's Date: 08/07/2012 Time: 1191-4782 PT Time Calculation (min): 20 min  PT Assessment / Plan / Recommendation Comments on Treatment Session  Pt more lethargic this session. Required increased assistance for OOB/mobility. Little to no verbalization. Increased difficulty following commands. RN aware. Continue to recommend SNF.     Follow Up Recommendations  Skilled nursing facility    Barriers to Discharge        Equipment Recommendations  Defer to next venue    Recommendations for Other Services    Frequency Min 3X/week   Plan Discharge plan remains appropriate    Precautions / Restrictions Precautions Precautions: Fall Restrictions Weight Bearing Restrictions: No   Pertinent Vitals/Pain Pt denied pain    Mobility  Bed Mobility Bed Mobility: Supine to Sit Supine to Sit: 1: +2 Total assist Supine to Sit: Patient Percentage: 0% Details for Bed Mobility Assistance: Multimodal cues for initiation of task, technique, hand placement. Assist for bil LEs off bed and trunk to upright. Utilized bed pad for scooting, positioning Transfers Transfers: Sit to Stand;Stand to Sit Sit to Stand: 1: +2 Total assist;From elevated surface;With upper extremity assist Sit to Stand: Patient Percentage: 20% Stand to Sit: 1: +2 Total assist;With upper extremity assist;To chair/3-in-1 Stand to Sit: Patient Percentage: 20% Transfer via Lift Equipment: Stedy Details for Transfer Assistance: Multimodal cues for technique, hand placement, initiation of task. Assist to position feet, rise, stabilize, control descent Facilitation to encourage trunk extension. Pt only able to stand for 20~30 seconds this session. Sit<>stand x 2. Stedy for bed>chair. Ambulation/Gait Ambulation/Gait Assistance: Not tested (comment)    Exercises     PT Diagnosis:    PT Problem List:   PT Treatment Interventions:     PT  Goals Acute Rehab PT Goals Pt will go Supine/Side to Sit: with max assist PT Goal: Supine/Side to Sit - Progress: Revised due to lack of progress Pt will go Sit to Supine/Side: with max assist PT Goal: Sit to Supine/Side - Progress: Revised due to lack of progress Pt will go Sit to Stand: with max assist PT Goal: Sit to Stand - Progress: Revised due to lack of progress Pt will go Stand to Sit: with max assist PT Goal: Stand to Sit - Progress: Revised due to lack of progress Pt will Ambulate: 1 - 15 feet;with max assist;with least restrictive assistive device PT Goal: Ambulate - Progress: Revised due to lack of progress  Visit Information  Last PT Received On: 08/07/12 Assistance Needed: +2    Subjective Data  Subjective: Pt did not speak this session.  Patient Stated Goal: None stated   Cognition  Overall Cognitive Status: Impaired Area of Impairment: Attention;Following commands Difficult to assess due to: Level of arousal Arousal/Alertness: Lethargic Orientation Level: Disoriented to;Place;Time;Situation Behavior During Session: Lethargic Attention - Other Comments: Pt having difficulty keep eyes open this session.  Following Commands: Follows one step commands inconsistently    Balance  Balance Balance Assessed: Yes Static Sitting Balance Static Sitting - Balance Support: Feet supported;Right upper extremity supported Static Sitting - Level of Assistance: 4: Min assist Static Sitting - Comment/# of Minutes: Assist to maintain upright sitting posture. Pt repeatedly leaning forward and to left side with propping on L elbow. Sat EOB 3-4 minutes.   End of Session PT - End of Session Equipment Utilized During Treatment: Oxygen Activity Tolerance: Patient limited by fatigue (Limited by level of arousal) Patient left: in chair;with call bell/phone  within reach;with family/visitor present Nurse Communication: Need for lift equipment   GP     [name]  [facility] 08/07/2012, 3:48 PM [phone]

## 2012-08-07 NOTE — Progress Notes (Signed)
Subjective: [name] is seen this morning with wife at the bedside. He is clearly not as bright as yesterday and appears a bit more dyspneic but no frank coughing. He does recognize me but falls back asleep. Wife does relate that he ate reasonably well yesterday. He denies any chest pain or shortness of breath. He's had no nausea vomiting.  Objective: Vital signs in last 24 hours: Temp:  [98.6 F (37 C)-99.2 F (37.3 C)] 99.2 F (37.3 C) (09/19 2130) Pulse Rate:  [69-70] 69  (09/19 2130) Resp:  [18-20] 18  (09/19 2130) BP: (110-129)/(68-76) 129/68 mmHg (09/19 2130) SpO2:  [94 %-96 %] 96 % (09/19 2130) Weight change:   CBG (last 3)  No results found for this basename: GLUCAP:3 in the last 72 hours  Intake/Output from previous day: 09/19 0701 - 09/20 0700 In: 2656.9 [P.O.:480; I.V.:1922.9; IV Piggyback:254] Out: 3150 [Urine:3150]  Physical Exam: Patient is awake alert but does drink back off to sleep. He will converse and does recognize me. Lungs reveal prolonged expiratory phase no frank rales wheezing or rhonchi diminished breath sounds at the bases. Cardiovascular same is irregular rate in the 80s no obvious murmur. Back is kyphotic. Abdomen is soft nontender good bowel sounds. He is no peripheral edema. No JVD. Diffusely weak but moves extremities x4.   Lab Results:  [facility] 08/07/12 0440 08/06/12 0434  NA 122* 124*  K 4.3 4.1  CL 91* 94*  CO2 20 19  GLUCOSE 102* 105*  BUN 13 12  CREATININE 0.90 0.98  CALCIUM 8.0* 7.8*  MG -- --  PHOS -- --    Basename 08/07/12 0440 08/06/12 0434  AST 41* 34  ALT 23 18  ALKPHOS 66 62  BILITOT 0.3 0.3  PROT 6.3 6.1  ALBUMIN 2.0* 1.9*    Basename 08/07/12 0440 08/06/12 0434  WBC 8.7 8.5  NEUTROABS -- --  HGB 9.8* 9.7*  HCT 26.6* 27.6*  MCV 97.4 95.8  PLT 262 254   Lab Results  Component Value Date   INR 1.10 07/04/2012   INR 2.19* 05/03/2010   INR 2.29* 05/02/2010   No results found for this basename:  CKTOTAL:3,CKMB:3,CKMBINDEX:3,TROPONINI:3 in the last 72 hours No results found for this basename: TSH,T4TOTAL,FREET3,T3FREE,THYROIDAB in the last 72 hours No results found for this basename: VITAMINB12:2,FOLATE:2,FERRITIN:2,TIBC:2,IRON:2,RETICCTPCT:2 in the last 72 hours  Studies/Results: Dg Chest 2 View  08/05/2012  *RADIOLOGY REPORT*  Clinical Data: Follow up pneumonia.  CHEST - 2 VIEW  Comparison: Two-view chest 08/03/2012.  Findings: A cavitary lesion in the right upper lobe persists.    It appears slightly smaller than on the prior exam.  Left upper lobe airspace disease is more prominent than on the prior study. Bilateral effusions are present.  The heart is enlarged.  Left basilar airspace disease has increased.  IMPRESSION:  1.  Stable slight decrease in the size of a cavitary lesion of the right upper lobe. 2.  Increasing left upper and left lower lobe airspace disease. 3.  Bilateral pleural effusions are more prominent, left greater than right.   Original Report Authenticated By: Jamesetta Orleans. MATTERN, M.D.      Assessment/Plan: #1 left lower lobe pneumonia in the setting of chronic MAC and known cavitary lesion right upper lobe clearly clinically appears worse than yesterday despite having made improvements over the prior 2 days. We'll recheck chest x-ray and brought out antibiotics coverage while evaluating this. We'll continue pulmonary toilet and bronchodilators.  #2 hyponatremia unclear whether this is volume or SIADH.  For now I discontinued his low-dose Lasix continue low rate volume replacement given the question of his swallowing and will follow closely. Should he not improve we will implement a trial of fluid restriction and/or medication intervention  #3 chronic atrial fibrillation rate controlled not an anticoagulation candidate  #4 essential hypertension stable  #5 primary hypothyroidism stable  #6 generalized failure to thrive and memory loss I did establish no CODE  STATUS.   LOS: 5 days   Yunior Jain A 08/07/2012, 7:20 AM  2

## 2012-08-08 ENCOUNTER — Inpatient Hospital Stay (HOSPITAL_COMMUNITY): Payer: Medicare Other

## 2012-08-08 DIAGNOSIS — D649 Anemia, unspecified: Secondary | ICD-10-CM | POA: Diagnosis present

## 2012-08-08 DIAGNOSIS — J189 Pneumonia, unspecified organism: Secondary | ICD-10-CM | POA: Diagnosis present

## 2012-08-08 DIAGNOSIS — E871 Hypo-osmolality and hyponatremia: Secondary | ICD-10-CM | POA: Diagnosis present

## 2012-08-08 LAB — CBC
Hemoglobin: 9.2 g/dL — ABNORMAL LOW (ref 13.0–17.0)
MCH: 32.4 pg (ref 26.0–34.0)
MCV: 93 fL (ref 78.0–100.0)
Platelets: 283 10*3/uL (ref 150–400)
RBC: 2.84 MIL/uL — ABNORMAL LOW (ref 4.22–5.81)
WBC: 8.8 10*3/uL (ref 4.0–10.5)

## 2012-08-08 LAB — COMPREHENSIVE METABOLIC PANEL
ALT: 27 U/L (ref 0–53)
AST: 47 U/L — ABNORMAL HIGH (ref 0–37)
CO2: 20 mEq/L (ref 19–32)
Calcium: 7.8 mg/dL — ABNORMAL LOW (ref 8.4–10.5)
Chloride: 89 mEq/L — ABNORMAL LOW (ref 96–112)
Creatinine, Ser: 1.01 mg/dL (ref 0.50–1.35)
GFR calc Af Amer: 76 mL/min — ABNORMAL LOW (ref >90)
GFR calc non Af Amer: 65 mL/min — ABNORMAL LOW (ref >90)
Glucose, Bld: 101 mg/dL — ABNORMAL HIGH (ref 70–99)
Sodium: 120 mEq/L — ABNORMAL LOW (ref 135–145)
Total Bilirubin: 0.3 mg/dL (ref 0.3–1.2)

## 2012-08-08 LAB — CULTURE, BLOOD (ROUTINE X 2): Culture: NO GROWTH

## 2012-08-08 MED ORDER — PIPERACILLIN-TAZOBACTAM 3.375 G IVPB 30 MIN
3.3750 g | Freq: Three times a day (TID) | INTRAVENOUS | Status: DC
  Administered 2012-08-08 – 2012-08-10 (×7): 3.375 g via INTRAVENOUS
  Filled 2012-08-08 (×7): qty 50

## 2012-08-08 MED ORDER — ENSURE PUDDING PO PUDG
1.0000 | Freq: Three times a day (TID) | ORAL | Status: DC
  Administered 2012-08-08 – 2012-08-11 (×10): 1 via ORAL
  Filled 2012-08-08 (×12): qty 1

## 2012-08-08 NOTE — Progress Notes (Signed)
Subjective: Sleepy, but easily awoken today, responded appropriately to questions. Continues to have a mildly productive cough, no dyspnea at rest on oxygen.   Objective: Vital signs in last 24 hours: Temp:  [98 F (36.7 C)-100.9 F (38.3 C)] 98.7 F (37.1 C) (09/21 0600) Pulse Rate:  [85-90] 85  (09/21 0600) Resp:  [18-28] 18  (09/21 0600) BP: (105-124)/(65-75) 105/69 mmHg (09/21 0600) SpO2:  [93 %-100 %] 96 % (09/21 0907) Weight change:    Intake/Output from previous day: 09/20 0701 - 09/21 0700 In: 300 [IV Piggyback:300] Out: 1300 [Urine:1300]   General appearance: sleepy and easily awoken for exam, in no distress Resp: decreased breath sounds left base Cardio: slightly irregular rhythm without significant murmur GI: soft, non-tender; bowel sounds normal; no masses,  no organomegaly Extremities: extremities normal, atraumatic, no cyanosis or edema  Lab Results:  Basename 08/08/12 0546 08/07/12 0440  WBC 8.8 8.7  HGB 9.2* 9.8*  HCT 26.4* 26.6*  PLT 283 262   BMET  Basename 08/08/12 0546 08/07/12 0440  NA 120* 122*  K 4.2 4.3  CL 89* 91*  CO2 20 20  GLUCOSE 101* 102*  BUN 12 13  CREATININE 1.01 0.90  CALCIUM 7.8* 8.0*   CMET CMP     Component Value Date/Time   NA 120* 08/08/2012 0546   K 4.2 08/08/2012 0546   CL 89* 08/08/2012 0546   CO2 20 08/08/2012 0546   GLUCOSE 101* 08/08/2012 0546   BUN 12 08/08/2012 0546   CREATININE 1.01 08/08/2012 0546   CALCIUM 7.8* 08/08/2012 0546   PROT 6.0 08/08/2012 0546   ALBUMIN 1.9* 08/08/2012 0546   AST 47* 08/08/2012 0546   ALT 27 08/08/2012 0546   ALKPHOS 63 08/08/2012 0546   BILITOT 0.3 08/08/2012 0546   GFRNONAA 65* 08/08/2012 0546   GFRAA 76* 08/08/2012 0546    CBG (last 3)  No results found for this basename: GLUCAP:3 in the last 72 hours  INR RESULTS:   Lab Results  Component Value Date   INR 1.10 07/04/2012   INR 2.19* 05/03/2010   INR 2.29* 05/02/2010     Studies/Results: Dg Chest 2 View  08/07/2012   *RADIOLOGY REPORT*  Clinical Data: History of lymphoma, COPD, hypertension  CHEST - 2 VIEW  Comparison: Chest x-ray of 08/05/2012  Findings: The lungs appear better aerated.  However there is airspace disease scattered throughout the left lung field suspicious for pneumonia.  The right lung apex is difficult to assess but there is a persistent opacity which unfortunately is overlain by the chin and not well evaluated.  There may be small effusions present.  Heart size is mildly enlarged.  IMPRESSION:  1.  Airspace disease throughout the left lung suspicious for pneumonia. 2.  The opacity in the right upper lung field is not as well seen as noted above but persists.   Original Report Authenticated By: Juline Patch, M.D.    Dg Chest Port 1 View  08/08/2012  *RADIOLOGY REPORT*  Clinical Data: Pleural effusions.  Pneumonia.  PORTABLE CHEST - 1 VIEW  Comparison: 08/07/2012  Findings: Cavitary right upper lobe lesion noted.  Continued prominent airspace opacity at the left lung base with patchy airspace opacities throughout much of the left lung.  Underlying interstitial accentuation noted.  Left heart border obscured.  The patient's chin and facial tissues obscure the medial lung apices.  Pleural thickening noted on the left.  IMPRESSION:  1.  Slightly worsened aeration at the left lung base with  otherwise persistent airspace opacities throughout much of the left lung. 2.  Cavitary lesion in the right upper lobe. 3.  Underlying interstitial accentuation persists. 4.  Probable left pleural effusion.   Original Report Authenticated By: Dellia Cloud, M.D.     Medications: I have reviewed the patient's current medications.  Assessment/Plan: #1 Pneumonia:  Clinically stable although chest x-ray appears worse. Will continue vancomycin and Zosyn for now with RT. #2 Hyponatremia:  Moderately severe and of uncertain cause, possibly SIADH. Will initiate lab workup, continue IVF, check an AM serum cortisol level.   #3 Protein calorie malnutrition: severe and will add nutrition supplements.   LOS: 6 days   Chiamaka Latka G 08/08/2012, 9:35 AM

## 2012-08-08 NOTE — Progress Notes (Signed)
Pt was placed on chest vest 2049. Settings were 8 Hertz for 8 minutes. Pt tolerated well.

## 2012-08-08 NOTE — Evaluation (Signed)
Clinical/Bedside Swallow Evaluation Patient Details  Name: [name] MRN: [id number] Date of Birth: 1924-08-05  Today's Date: 08/08/2012 Time: 4098-1191 SLP Time Calculation (min): 30 min  Past Medical History:  Past Medical History  Diagnosis Date  . Allergic rhinitis   . 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 COPD (chronic obstructive pulmonary disease)   . Diverticula of colon   . Right leg DVT 01/2010  . IHD (ischemic heart disease)   . Hyperlipidemia   . Hypertension   . Depression   . Myalgia   . Hypothyroidism   . Bronchiectasis   . Spinal stenosis   . History of heartburn   . Joint pain   . 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 Coronary artery disease    Past Surgical History:  Past Surgical History  Procedure Date  . Cardiac catheterization 06/13/2000    normal. ef 60%  . Cardiovascular stress test 04/2010    ef 54%, and normal perfusion  . Coronary angioplasty 1991    1st diagonal  . Carpal tunnel release     right  . Rotator cuff repair     left shoulder  . Knee surgery August  2006    S/P right knee replacement  . Laminectomy     2005  . Cataract extraction     Bilateral    HPI:  78 yo WM [facility]. [facility] Afib, pna and AMS. He was seen  9/13 by Pulmonary(TP) and treated for resp infection with Levaquin and mucinex. Of note the family declined hospital admission at that time. Subsequently his mental status declined and his WOB increased. He was admitted(9-15) and tx for CAP with Roc/Zithro. He had improved but on 9/20 his mental status declined. Pts wife denied history of dysphagia. esophagram from 2007 with pt small hiatal hernia, marked reflux and mild to mod esophageal dilatation- with normal swallow function.      Assessment / Plan / Recommendation Clinical Impression  Pt presents with a mild oral dysphagia likely secondary to AMS.  Pt with prolonged mastication of soft solids requiring verbal or tactile cues in 50% of trials to encourage transit and swallow. Pt responding to questions, actively accepting PO and initiatiating straw sips, but eyes are not open. No overt signs of aspriation observed, adequate strength, dry vocal quality. Pneumonia raises suspicion for aspiration, but wife report pt was maximally alert, walking 1/4 to cafeteria for meals, feedin himself regular foods with no immediate cough at time of onset. If aspiration was cause of pna, it was not related to reduced mental status. At this time however, reduced mental status does increase risk of aspriation. Advised wife on aspiration precautions and signs of aspiration. Will return to check on pt for any indication of aspriaton and need for objective testing. Pt may continue current diet (dys 3/thin) at this time unless further concerns arise.     Aspiration Risk  Mild    Diet Recommendation Dysphagia 3 (Mechanical Soft);Thin liquid   Liquid Administration via: Straw Medication Administration: Whole meds with puree Supervision: Trained caregiver to feed patient;Staff feed patient Compensations: Slow rate;Small sips/bites;Check for pocketing Postural Changes and/or Swallow Maneuvers: Seated upright 90 degrees    Other  Recommendations Oral Care Recommendations: Oral care BID   Follow Up Recommendations  None    Frequency and Duration min 2x/week  2 weeks  Pertinent Vitals/Pain NA    SLP Swallow Goals Patient will consume recommended diet without observed clinical signs of aspiration with: Modified independent assistance Patient will utilize recommended strategies during swallow to increase swallowing safety with: Minimal cueing   Swallow Study Prior Functional Status       General HPI: 40 yo WM who [facility] following Afib, pna and AMS. He was seen  9/13 by Pulmonary(TP) and  treated for resp infection with Levaquin and mucinex. Of note the family declined hospital admission at that time. Subsequently his mental status declined and his WOB increased. He was admitted(9-15) and tx for CAP with Roc/Zithro. He had improved but on 9/20 his mental status declined. Pts wife denied history of dysphagia. esophagram from 2007 with pt small hiatal hernia, marked reflux and mild to mod esophageal dilatation- with normal swallow function.    Type of Study: Bedside swallow evaluation Diet Prior to this Study: Dysphagia 3 (soft);Thin liquids Temperature Spikes Noted: No Respiratory Status: Supplemental O2 delivered via (comment) Behavior/Cognition: Cooperative;Lethargic;Decreased sustained attention Oral Cavity - Dentition: Adequate natural dentition Self-Feeding Abilities: Total assist Patient Positioning: Upright in bed Baseline Vocal Quality: Clear Volitional Cough: Strong Volitional Swallow: Able to elicit    Oral/Motor/Sensory Function Overall Oral Motor/Sensory Function: Appears within functional limits for tasks assessed   Ice Chips     Thin Liquid Thin Liquid: Impaired Presentation: Straw Oral Phase Functional Implications: Prolonged oral transit;Oral holding Pharyngeal  Phase Impairments:  (audible swallow)    Nectar Thick Nectar Thick Liquid: Not tested   Honey Thick Honey Thick Liquid: Not tested   Puree Puree: Impaired Presentation: Spoon Oral Phase Functional Implications: Prolonged oral transit;Oral residue (prolonged mastication of puree bolus)   Solid   GO    Solid: Impaired Presentation: Spoon Oral Phase Functional Implications: Oral residue;Other (comment) (prolonged mastication of mech soft. mod vbl/tact cues) Other Comments: mechanical soft       Marella Vanderpol, Riley Nearing 08/08/2012,9:03 AM

## 2012-08-09 LAB — COMPREHENSIVE METABOLIC PANEL
ALT: 21 U/L (ref 0–53)
AST: 32 U/L (ref 0–37)
Albumin: 1.8 g/dL — ABNORMAL LOW (ref 3.5–5.2)
Alkaline Phosphatase: 57 U/L (ref 39–117)
Calcium: 7.8 mg/dL — ABNORMAL LOW (ref 8.4–10.5)
GFR calc Af Amer: 72 mL/min — ABNORMAL LOW (ref >90)
Potassium: 4 mEq/L (ref 3.5–5.1)
Sodium: 119 mEq/L — CL (ref 135–145)
Total Protein: 5.9 g/dL — ABNORMAL LOW (ref 6.0–8.3)

## 2012-08-09 LAB — CBC
MCH: 33 pg (ref 26.0–34.0)
MCHC: 35.3 g/dL (ref 30.0–36.0)
Platelets: 291 10*3/uL (ref 150–400)
RDW: 14.5 % (ref 11.5–15.5)

## 2012-08-09 LAB — VANCOMYCIN, TROUGH: Vancomycin Tr: 15.3 ug/mL (ref 10.0–20.0)

## 2012-08-09 LAB — PRO B NATRIURETIC PEPTIDE: Pro B Natriuretic peptide (BNP): 1495 pg/mL — ABNORMAL HIGH (ref 0–450)

## 2012-08-09 MED ORDER — DESMOPRESSIN ACETATE SPRAY 0.01 % NA SOLN: 10.0000 ug | Freq: Two times a day (BID) | NASAL | Status: DC

## 2012-08-09 MED ORDER — COSYNTROPIN 0.25 MG IJ SOLR
0.2500 mg | Freq: Once | INTRAMUSCULAR | Status: DC
  Filled 2012-08-09: qty 0.25

## 2012-08-09 MED ORDER — COSYNTROPIN 0.25 MG IJ SOLR
0.2500 mg | Freq: Once | INTRAMUSCULAR | Status: AC
  Administered 2012-08-10: 0.25 mg via INTRAVENOUS
  Filled 2012-08-09: qty 0.25

## 2012-08-09 NOTE — Progress Notes (Signed)
Subjective: Sleepier today and not eating very much at all. No dyspnea on [place] oxygen.  Objective: Vital signs in last 24 hours: Temp:  [97 F (36.1 C)-98.5 F (36.9 C)] 98.5 F (36.9 C) (09/22 1327) Pulse Rate:  [73-96] 74  (09/22 1327) Resp:  [18] 18  (09/22 1327) BP: (92-116)/(61-76) 109/62 mmHg (09/22 1327) SpO2:  [95 %-99 %] 96 % (09/22 1327) Weight change:    Intake/Output from previous day: 09/21 0701 - 09/22 0700 In: -  Out: 1400 [Urine:1400]   General appearance: sleepy and briefly opens eyes with exam Resp: decreased breath sounds in left base Cardio: regular rate and rhythm, S1, S2 normal, no murmur, click, rub or gallop GI: soft, non-tender; bowel sounds normal; no masses,  no organomegaly  Lab Results:  Basename 08/09/12 0510 08/08/12 0546  WBC 8.8 8.8  HGB 8.8* 9.2*  HCT 24.9* 26.4*  PLT 291 283   BMET  Basename 08/09/12 0510 08/08/12 0546  NA 119* 120*  K 4.0 4.2  CL 88* 89*  CO2 21 20  GLUCOSE 98 101*  BUN 12 12  CREATININE 1.05 1.01  CALCIUM 7.8* 7.8*   CMET CMP     Component Value Date/Time   NA 119* 08/09/2012 0510   K 4.0 08/09/2012 0510   CL 88* 08/09/2012 0510   CO2 21 08/09/2012 0510   GLUCOSE 98 08/09/2012 0510   BUN 12 08/09/2012 0510   CREATININE 1.05 08/09/2012 0510   CALCIUM 7.8* 08/09/2012 0510   PROT 5.9* 08/09/2012 0510   ALBUMIN 1.8* 08/09/2012 0510   AST 32 08/09/2012 0510   ALT 21 08/09/2012 0510   ALKPHOS 57 08/09/2012 0510   BILITOT 0.3 08/09/2012 0510   GFRNONAA 62* 08/09/2012 0510   GFRAA 72* 08/09/2012 0510    CBG (last 3)  No results found for this basename: GLUCAP:3 in the last 72 hours  INR RESULTS:   Lab Results  Component Value Date   INR 1.10 07/04/2012   INR 2.19* 05/03/2010   INR 2.29* 05/02/2010     Studies/Results: Dg Chest Port 1 View  08/08/2012  *RADIOLOGY REPORT*  Clinical Data: Pleural effusions.  Pneumonia.  PORTABLE CHEST - 1 VIEW  Comparison: 08/07/2012  Findings: Cavitary right upper lobe lesion  noted.  Continued prominent airspace opacity at the left lung base with patchy airspace opacities throughout much of the left lung.  Underlying interstitial accentuation noted.  Left heart border obscured.  The patient's chin and facial tissues obscure the medial lung apices.  Pleural thickening noted on the left.  IMPRESSION:  1.  Slightly worsened aeration at the left lung base with otherwise persistent airspace opacities throughout much of the left lung. 2.  Cavitary lesion in the right upper lobe. 3.  Underlying interstitial accentuation persists. 4.  Probable left pleural effusion.   Original Report Authenticated By: Dellia Cloud, M.D.     Medications: I have reviewed the patient's current medications.  Assessment/Plan: #1 Pneumonia:  Stable on vancomycin and zosyn. #2 Hyponatremia:  Slightly worse and due to adrenal insufficiency versus SIADH. The serum cortisol level at 5 am was quite low, so we will check a cosyntropin stimulation test. We will consider starting demeclocycline if treatment of possible adrenal insufficiency does not improve serum sodium levels.  LOS: 7 days   [name] G 08/09/2012, 1:58 PM

## 2012-08-09 NOTE — Progress Notes (Signed)
ANTIBIOTIC CONSULT NOTE - Follow Up  Pharmacy Consult for Vancomycin (zosyn per MD) Indication: pneumonia  Allergies  Allergen Reactions  . Sulfonamide Derivatives Other (See Comments)    unknown    Patient Measurements: Height: 6' (182.9 cm) Weight: 176 lb 11.2 oz (80.151 kg) IBW/kg (Calculated) : 77.6   Vital Signs: Temp: 97.6 F (36.4 C) (09/22 1610) Temp src: Oral (09/22 0613) BP: 104/66 mmHg (09/22 9604) Pulse Rate: 78  (09/22 0613) Intake/Output from previous day: 09/21 0701 - 09/22 0700 In: -  Out: 1400 [Urine:1400] Intake/Output from this shift: Total I/O In: 270 [P.O.:270] Out: -   Labs:  Basename 08/09/12 0510 08/08/12 0546 08/07/12 0440  WBC 8.8 8.8 8.7  HGB 8.8* 9.2* 9.8*  PLT 291 283 262  LABCREA -- -- --  CREATININE 1.05 1.01 0.90   Estimated Creatinine Clearance: 55.4 ml/min (by C-G formula based on Cr of 1.05).  Microbiology: Recent Results (from the past 720 hour(s))  CULTURE, BLOOD (ROUTINE X 2)     Status: Normal   Collection Time   08/02/12 12:15 PM      Component Value Range Status Comment   Specimen Description BLOOD RIGHT [facility]   Final    Special Requests BOTTLES DRAWN AEROBIC AND ANAEROBIC 6 CC EACH   Final    Culture  Setup Time 08/02/2012 21:28   Final    Culture NO GROWTH 5 DAYS   Final    Report Status 08/08/2012 FINAL   Final   CULTURE, BLOOD (ROUTINE X 2)     Status: Normal   Collection Time   08/02/12 12:23 PM      Component Value Range Status Comment   Specimen Description BLOOD RIGHT ARM   Final    Special Requests BOTTLES DRAWN AEROBIC AND ANAEROBIC 6 CC EACH   Final    Culture  Setup Time 08/02/2012 21:28   Final    Culture NO GROWTH 5 DAYS   Final    Report Status 08/08/2012 FINAL   Final     Medical History: Past Medical History  Diagnosis Date  . Allergic rhinitis   . 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 COPD (chronic obstructive pulmonary disease)   . Diverticula of colon   . Right leg DVT 01/2010    . IHD (ischemic heart disease)   . Hyperlipidemia   . Hypertension   . Depression   . Myalgia   . Hypothyroidism   . Bronchiectasis   . Spinal stenosis   . History of heartburn   . Joint pain   . 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 Coronary artery disease     Assessment:  50 YOM with left lower lobe pneumonia in the setting of chronic MAC with worsening right upper lobe cavitary lesion.    Patient had received 5 days of ceftriaxone, now on Day # 3 vancomycin 1g q12h and zoysn 3.375g IV q8h.  Scr was trending up slightly, VT this AM tx.  Tm 9.2, WBC 8.8  Of note, patient had a listed allergy to penicillin (reaction = rash); however, tolerated ceftriaxone and no reaction to Zosyn reported. PCN allergy removed.   Goal of Therapy:  Vancomycin trough level 15-20 mcg/ml  Plan:   Continue Vancomycin 1g IV q12h  Follow labs, vitals and cultures  Repeat VT if necessary  Adjust dose as appropriate.  [name] PharmD  [phone] 08/09/2012 10:07 AM

## 2012-08-10 ENCOUNTER — Inpatient Hospital Stay (HOSPITAL_COMMUNITY): Payer: Medicare Other

## 2012-08-10 DIAGNOSIS — J471 Bronchiectasis with (acute) exacerbation: Secondary | ICD-10-CM

## 2012-08-10 DIAGNOSIS — J189 Pneumonia, unspecified organism: Secondary | ICD-10-CM

## 2012-08-10 LAB — CBC
HCT: 27.2 % — ABNORMAL LOW (ref 39.0–52.0)
MCHC: 36.4 g/dL — ABNORMAL HIGH (ref 30.0–36.0)
Platelets: 348 10*3/uL (ref 150–400)
RDW: 14.3 % (ref 11.5–15.5)

## 2012-08-10 LAB — COMPREHENSIVE METABOLIC PANEL
ALT: 20 U/L (ref 0–53)
AST: 31 U/L (ref 0–37)
Albumin: 2 g/dL — ABNORMAL LOW (ref 3.5–5.2)
Alkaline Phosphatase: 61 U/L (ref 39–117)
Potassium: 3.5 mEq/L (ref 3.5–5.1)
Sodium: 122 mEq/L — ABNORMAL LOW (ref 135–145)
Total Protein: 6.3 g/dL (ref 6.0–8.3)

## 2012-08-10 MED ORDER — DEMECLOCYCLINE HCL 150 MG PO TABS
150.0000 mg | ORAL_TABLET | Freq: Two times a day (BID) | ORAL | Status: DC
  Administered 2012-08-10 – 2012-08-11 (×3): 150 mg via ORAL
  Filled 2012-08-10 (×4): qty 1

## 2012-08-10 MED ORDER — PANTOPRAZOLE SODIUM 40 MG PO TBEC
40.0000 mg | DELAYED_RELEASE_TABLET | Freq: Two times a day (BID) | ORAL | Status: DC
  Administered 2012-08-10 – 2012-08-11 (×3): 40 mg via ORAL
  Filled 2012-08-10 (×6): qty 1

## 2012-08-10 MED ORDER — LEVALBUTEROL HCL 0.63 MG/3ML IN NEBU
0.6300 mg | INHALATION_SOLUTION | Freq: Four times a day (QID) | RESPIRATORY_TRACT | Status: DC
  Administered 2012-08-10 – 2012-08-11 (×4): 0.63 mg via RESPIRATORY_TRACT
  Filled 2012-08-10 (×7): qty 3

## 2012-08-10 MED ORDER — CIPROFLOXACIN HCL 500 MG PO TABS: 500.0000 mg | ORAL_TABLET | Freq: Two times a day (BID) | ORAL | Status: DC

## 2012-08-10 MED ORDER — LEVOFLOXACIN 500 MG PO TABS
500.0000 mg | ORAL_TABLET | Freq: Every day | ORAL | Status: DC
  Administered 2012-08-10 – 2012-08-11 (×2): 500 mg via ORAL
  Filled 2012-08-10 (×2): qty 1

## 2012-08-10 NOTE — Progress Notes (Signed)
Physical Therapy Treatment Patient Details Name: [name] MRN: [id number] DOB: [birth year] Today's Date: 08/10/2012 Time: 1400-1436 PT Time Calculation (min): 36 min  PT Assessment / Plan / Recommendation Comments on Treatment Session  Continues to be lethargic. Required increased assistance this session. SNF.    Follow Up Recommendations  Skilled nursing facility    Barriers to Discharge        Equipment Recommendations  Defer to next venue    Recommendations for Other Services    Frequency Min 3X/week   Plan Discharge plan remains appropriate    Precautions / Restrictions Precautions Precautions: Fall Restrictions Weight Bearing Restrictions: No   Pertinent Vitals/Pain     Mobility  Bed Mobility Bed Mobility: Rolling Right Rolling Right: 3: Mod assist Supine to Sit: 1: +2 Total assist Supine to Sit: Patient Percentage: 20% Sit to Supine: 1: +2 Total assist Sit to Supine: Patient Percentage: 20% Details for Bed Mobility Assistance: Assist for bil LEs and trunk to upright.  Transfers Transfers: Sit to Stand;Stand to Sit Sit to Stand: 1: +2 Total assist;From elevated surface;From bed;With upper extremity assist Sit to Stand: Patient Percentage: 20% Stand to Sit: 1: +2 Total assist;To chair/3-in-1 Stand to Sit: Patient Percentage: 20% Transfer via Lift Equipment: Stedy Details for Transfer Assistance: Multimodal cues for technique, hand placement, initiation of task. Assist to position feet, rise, stabilize, control descent. Facilitation to encourage trunk extension. Leaning to R side. +3 for full hip extension to close stedy.  Ambulation/Gait Ambulation/Gait Assistance: Not tested (comment)    Exercises General Exercises - Lower Extremity Ankle Circles/Pumps: AROM;Both;5 reps;Seated Long Arc Quad: AROM;Both;5 reps;Seated;AAROM Heel Slides: AAROM;Both;5 reps;Seated Hip ABduction/ADduction: AAROM;Both;5 reps;Seated   PT Diagnosis:    PT Problem List:   PT  Treatment Interventions:     PT Goals Acute Rehab PT Goals Pt will go Supine/Side to Sit: with max assist PT Goal: Supine/Side to Sit - Progress: Progressing toward goal Pt will go Sit to Stand: with max assist PT Goal: Sit to Stand - Progress: Progressing toward goal Pt will go Stand to Sit: with max assist PT Goal: Stand to Sit - Progress: Progressing toward goal  Visit Information  Last PT Received On: 08/10/12 Assistance Needed: +3 or more PT/OT Co-Evaluation/Treatment: Yes    Subjective Data  Subjective: Very little verbalization unless name called Patient Stated Goal: None   Cognition  Overall Cognitive Status: Impaired Area of Impairment: Attention;Following commands Difficult to assess due to: Level of arousal Arousal/Alertness: Lethargic Orientation Level: Disoriented to;Place;Time;Situation Behavior During Session: Lethargic Current Attention Level: Focused Attention - Other Comments: Pt was more alert this session, keeping eyes open for longer periods of time. Following Commands: Follows one step commands inconsistently Awareness of Errors: Assistance required to correct errors made;Assistance required to identify errors made    Balance  Static Sitting Balance Static Sitting - Balance Support: Left upper extremity supported;Feet supported Static Sitting - Level of Assistance: 5: Stand by assistance;4: Min assist Static Sitting - Comment/# of Minutes: Pt leans to the left and when he becomes fatigued, he props himself up on his R elbow.  End of Session PT - End of Session Equipment Utilized During Treatment: Oxygen Activity Tolerance: Patient limited by fatigue Patient left: in chair;with call bell/phone within reach Nurse Communication: Need for lift equipment   GP     Rebeca Alert [facility] 08/10/2012, 4:54 PM [phone]

## 2012-08-10 NOTE — Progress Notes (Signed)
Subjective: [name] is more alert-still quiet. resp unlabored. Good po intake. St noted.   Objective: Vital signs in last 24 hours: Temp:  [98.3 F (36.8 C)-98.5 F (36.9 C)] 98.3 F (36.8 C) (09/23 0649) Pulse Rate:  [63-79] 79  (09/23 0649) Resp:  [18] 18  (09/23 0649) BP: (103-123)/(62-71) 123/66 mmHg (09/23 0649) SpO2:  [92 %-98 %] 98 % (09/23 0649) Weight change:   CBG (last 3)  No results found for this basename: GLUCAP:3 in the last 72 hours  Intake/Output from previous day: 09/22 0701 - 09/23 0700 In: 450 [P.O.:450] Out: 1850 [Urine:1850]  Physical Exam: Patient is alert awake did welcome me eyes are open but he went spoke with followup conversation.. Lungs are grossly clear without rhonchi rales or wheezing but overall diminished breath sounds and poor inspiratory effort. Heart sounds are distant regular no murmur. Abdomen is soft and nontender. No JVD or bruits. No peripheral edema.   Lab Results:  [facility] 08/09/12 0510 08/08/12 0546  NA 119* 120*  K 4.0 4.2  CL 88* 89*  CO2 21 20  GLUCOSE 98 101*  BUN 12 12  CREATININE 1.05 1.01  CALCIUM 7.8* 7.8*  MG -- --  PHOS -- --    Basename 08/09/12 0510 08/08/12 0546  AST 32 47*  ALT 21 27  ALKPHOS 57 63  BILITOT 0.3 0.3  PROT 5.9* 6.0  ALBUMIN 1.8* 1.9*    Basename 08/09/12 0510 08/08/12 0546  WBC 8.8 8.8  NEUTROABS -- --  HGB 8.8* 9.2*  HCT 24.9* 26.4*  MCV 93.3 93.0  PLT 291 283   Lab Results  Component Value Date   INR 1.10 07/04/2012   INR 2.19* 05/03/2010   INR 2.29* 05/02/2010   No results found for this basename: CKTOTAL:3,CKMB:3,CKMBINDEX:3,TROPONINI:3 in the last 72 hours  Basename 08/09/12 0510  TSH 2.408  T4TOTAL --  T3FREE --  THYROIDAB --   No results found for this basename: VITAMINB12:2,FOLATE:2,FERRITIN:2,TIBC:2,IRON:2,RETICCTPCT:2 in the last 72 hours  Studies/Results: No results found.   Assessment/Plan: 1. Health care  acquired pneumonia antibiotics to vancomycin and  Zosyn we'll continue this at least for the time being he seems to responding clinically and this seems to be very little evidence of aspiration based upon his speech therapy evaluation. Her converting this over to Levaquin in the next one to 2 days and [facility]   2. MAC- stable  3.  COPD- bronchidilators-- late steroids  4. Hyponatremia- cortrosyn stim this am, empiric demeclicycline for siadh- believe euvolemic  5. Chronic afib- rate controleed, fall risk  6. HTN- stable   LOS: 8 days   [name] A 08/10/2012, 7:07 AM

## 2012-08-10 NOTE — Progress Notes (Signed)
Chart review.  Per MD note, Pt not medically stable for d/c today.  CSW to continue to follow.  Providence Crosby, LCSWA Clinical Social Work [phone]

## 2012-08-10 NOTE — Progress Notes (Addendum)
Speech Language Pathology Dysphagia Treatment Patient Details Name: [name] MRN: [id number] DOB: 1924-09-03 Today's Date: 08/10/2012 Time: 4098-1191 SLP Time Calculation (min): 18 min  Assessment / Plan / Recommendation Clinical Impression  Pt seen today for tolerance of diet, family not present during ST session.  Pt requires total assist for po - RN reports family has been feeding him.  Swallow appears consistent with findings from BSE on 08/08/12.  Delayed oral transit with all boluses and slow mastication observed with pt requiring verbal cues to swallow with 50% of boluses.  No overt clinical s/s of aspiration.  Pt did belch after a few bites of doughnut and 3 straw sips of water.    Pt answered approximately 50% of SLP questions today with delayed responses.  Given pt's overall weakness, nonproductive cough, h/o marked reflux per esophagram in 2007 and mental status causing him to rely on others for feeding, he may be experiencing episodic aspiration from oropharyngeal and/or esophageal issues.   Pt did not answer SLP questions regarding if he senses reflux or coughs associated with po intake.  Note pt is for repeat CXR today.    Rec consider MBS and/or esophagram to assess for overt silent aspiration and swallow function.   Unfortunately at this time- regardless of findings on MBS, this pt's aspiration risk is chronic due to multiple risk factors and SLP role to mitigate risks appears reasonable.    SLP to follow up next date.  Thanks.       Diet Recommendation  Initiate / Change Diet: Dysphagia 3 (mechanical soft);Thin liquid    SLP Plan Continue with current plan of care   Pertinent Vitals/Pain Afebrile, nonproductive cough,  Recent CXR:  slight worsening left lung base with persistent airspace opacities throughout much of left lung, cavitary lesion right upper lobe   Swallowing Goals  SLP Swallowing Goals Patient will consume recommended diet without observed clinical signs  of aspiration with: Maximum assistance Swallow Study Goal #1 - Progress: Revised (modified due to lack of progress/goal met) (mental status may not allow this level of independence) Patient will utilize recommended strategies during swallow to increase swallowing safety with: Maximum assistance Swallow Study Goal #2 - Progress: Revised (modified due to lack of progress/goal met) (mental status may not allow this level of independence) Goal #3: Family, pt and MD will determine if to proceed with instrumental assessment of swallowing.    General Temperature Spikes Noted: No Respiratory Status: Supplemental O2 delivered via (comment) Behavior/Cognition: Lethargic (pt would awaken but quickly closed eyes again, ?  lethargy) Oral Cavity - Dentition: Adequate natural dentition Patient Positioning: Upright in bed  Oral Cavity - Oral Hygiene   Oral cavity was clean.  Dysphagia Treatment Treatment focused on: Skilled observation of diet tolerance (no family present) Treatment Methods/Modalities: Skilled observation;Differential diagnosis Patient observed directly with PO's: Yes Type of PO's observed: Dysphagia 3 (soft);Thin liquids Feeding: Total assist Liquids provided via: Straw Oral Phase Signs & Symptoms: Prolonged oral phase;Prolonged bolus formation Pharyngeal Phase Signs & Symptoms: Suspected delayed swallow initiation;Multiple swallows (? piecemeal swallowing) Type of cueing: Verbal Amount of cueing: Moderate (to swallow)   GO     Donavan Burnet, MS [facility] SLP [phone]

## 2012-08-10 NOTE — Progress Notes (Signed)
Occupational Therapy Treatment Patient Details Name: [name] MRN: [id number] DOB: 11/12/1924 Today's Date: 08/10/2012 Time: 1478-2956 OT Time Calculation (min): 39 min  OT Assessment / Plan / Recommendation Comments on Treatment Session Pt continues to require extensive physical A for all mobility and ADLs. Pt was more alert and communicative today.    Follow Up Recommendations  Skilled nursing facility    Barriers to Discharge       Equipment Recommendations  Defer to next venue    Recommendations for Other Services    Frequency Min 1X/week   Plan Discharge plan remains appropriate    Precautions / Restrictions Precautions Precautions: Fall   Pertinent Vitals/Pain Denied pain.    ADL  Eating/Feeding: Performed;Moderate assistance (to steady self when bringing cup to mouth) Where Assessed - Eating/Feeding: Chair Grooming: Performed;Wash/dry face;Moderate assistance Where Assessed - Grooming: Unsupported sitting Toilet Transfer: Automotive engineer: Patient Percentage: 20% Toilet Transfer Method: Other (comment) (utilized stedy) Toileting - Architect and Hygiene: Performed;+2 Total assistance Toileting - Architect and Hygiene: Patient Percentage: 0% Where Assessed - Toileting Clothing Manipulation and Hygiene: Supine, head of bed flat;Rolling right and/or left ADL Comments: Utilized stedy to transfer pt to chair. Pt had max difficulty bringing hips forward and dem'd a heavy R sided lean. Pt fatigues VERY quickly.    OT Diagnosis:    OT Problem List:   OT Treatment Interventions:     OT Goals ADL Goals ADL Goal: Eating - Progress: Progressing toward goals ADL Goal: Grooming - Progress: Progressing toward goals ADL Goal: Toilet Transfer - Progress: Not progressing ADL Goal: Additional Goal #1 - Progress: Progressing toward goals ADL Goal: Additional Goal #2 - Progress: Progressing toward goals  Visit Information  Last OT Received  On: 08/10/12 Assistance Needed: +3 or more PT/OT Co-Evaluation/Treatment: Yes    Subjective Data  Subjective: I need to sit.   Prior Functioning       Cognition  Overall Cognitive Status: Impaired Area of Impairment: Attention;Following commands Difficult to assess due to: Level of arousal Arousal/Alertness: Lethargic Orientation Level: Disoriented to;Place;Time;Situation Behavior During Session: Lethargic Current Attention Level: Focused Attention - Other Comments: Pt was more alert this session, keeping eyes open for longer periods of time. Following Commands: Follows one step commands inconsistently Awareness of Errors: Assistance required to correct errors made;Assistance required to identify errors made    Mobility  Shoulder Instructions Bed Mobility Bed Mobility: Rolling Right Rolling Right: 3: Mod assist;With rail Supine to Sit: 1: +2 Total assist Supine to Sit: Patient Percentage: 20% Sit to Supine: 1: +2 Total assist Sit to Supine: Patient Percentage: 20% Details for Bed Mobility Assistance: Pt was ablet to grab onto the rail to assist in rolling. Extensive physical A needed to bring trunk to upright position and to bring LEs to EOB. Transfers Sit to Stand: 1: +2 Total assist;From elevated surface;From bed;With upper extremity assist Sit to Stand: Patient Percentage: 20% Stand to Sit: 1: +2 Total assist;To chair/3-in-1 Stand to Sit: Patient Percentage: 20% Transfer via Lift Equipment: Stedy Details for Transfer Assistance: Multimodal cues for technique, hand placement, initiation of task. Assist to position feet, rise, stabilize, control descent Facilitation to encourage trunk extension. Pt leaned heavily to the R and had max difficulty bringing hips forward.       Exercises      Balance Static Sitting Balance Static Sitting - Balance Support: Left upper extremity supported;Feet supported Static Sitting - Level of Assistance: 5: Stand by assistance;4: Min  assist Static  Sitting - Comment/# of Minutes: Pt leans to the left and when he becomes fatigued, he props himself up on his R elbow.   End of Session OT - End of Session Activity Tolerance: Patient limited by fatigue Patient left: in chair;with call bell/phone within reach Nurse Communication: Need for lift equipment  GO     [name] A OTR/L [phone] 08/10/2012, 2:51 PM

## 2012-08-10 NOTE — Progress Notes (Signed)
Pt profile: 78 yo WM who [facility]. Admitted 9/15 with CAP vs bronchiectasis exacerbation   Subj: No distress, no complaints. Afebrile > 24 hrs   Obj: Filed Vitals:   08/10/12 1423  BP: 100/64  Pulse: 73  Temp: 98.2 F (36.8 C)  Resp: 16    Gen: NAD HEENT: WNL Neck: no JVD Chest: bibasilar rales, no wheezes Cardiac: RRR Abd: NABS Ext: no edema  BMET    Component Value Date/Time   NA 122* 08/10/2012 0750   K 3.5 08/10/2012 0750   CL 89* 08/10/2012 0750   CO2 23 08/10/2012 0750   GLUCOSE 94 08/10/2012 0750   BUN 8 08/10/2012 0750   CREATININE 0.93 08/10/2012 0750   CALCIUM 8.4 08/10/2012 0750   GFRNONAA 74* 08/10/2012 0750   GFRAA 86* 08/10/2012 0750    CBC    Component Value Date/Time   WBC 7.7 08/10/2012 0750   WBC 6.5 10/04/2011 1054   RBC 2.83* 08/10/2012 0750   RBC 3.27* 10/04/2011 1054   HGB 9.9* 08/10/2012 0750   HGB 11.2* 10/04/2011 1054   HCT 27.2* 08/10/2012 0750   HCT 33.2* 10/04/2011 1054   PLT 348 08/10/2012 0750   PLT 229 10/04/2011 1054   MCV 96.1 08/10/2012 0750   MCV 101.7* 10/04/2011 1054   MCH 35.0* 08/10/2012 0750   MCH 34.3* 10/04/2011 1054   MCHC 36.4* 08/10/2012 0750   MCHC 33.8 10/04/2011 1054   RDW 14.3 08/10/2012 0750   RDW 15.3* 10/04/2011 1054   LYMPHSABS 0.9 08/02/2012 1215   LYMPHSABS 1.5 10/04/2011 1054   MONOABS 1.8* 08/02/2012 1215   MONOABS 0.7 10/04/2011 1054   EOSABS 0.0 08/02/2012 1215   EOSABS 0.1 10/04/2011 1054   BASOSABS 0.0 08/02/2012 1215   BASOSABS 0.0 10/04/2011 1054    CXR: No new film   IMPRESSION: Suspected PNA Bronchiectasis RUL cavity  PLAN/RECS:  Recheck CXR Should be OK to transfer to oral abx in anticipation of discharge soon It is not clear to me whether PPD has been performed but given RUL cavity and NH status, need to consider this  Billy Fischer, MD ; [facility] service Mobile [phone].  After 5:30 PM or weekends, call [phone]

## 2012-08-10 NOTE — Progress Notes (Signed)
IV team notified for restart.   [name]

## 2012-08-11 LAB — COMPREHENSIVE METABOLIC PANEL
Albumin: 1.9 g/dL — ABNORMAL LOW (ref 3.5–5.2)
BUN: 11 mg/dL (ref 6–23)
Creatinine, Ser: 0.96 mg/dL (ref 0.50–1.35)
GFR calc Af Amer: 85 mL/min — ABNORMAL LOW (ref >90)
Glucose, Bld: 96 mg/dL (ref 70–99)
Total Bilirubin: 0.2 mg/dL — ABNORMAL LOW (ref 0.3–1.2)
Total Protein: 6.1 g/dL (ref 6.0–8.3)

## 2012-08-11 LAB — CBC
HCT: 27.1 % — ABNORMAL LOW (ref 39.0–52.0)
Hemoglobin: 9.8 g/dL — ABNORMAL LOW (ref 13.0–17.0)
MCHC: 36.2 g/dL — ABNORMAL HIGH (ref 30.0–36.0)
MCV: 96.1 fL (ref 78.0–100.0)
RDW: 14.4 % (ref 11.5–15.5)

## 2012-08-11 MED ORDER — LEVOFLOXACIN 750 MG PO TABS: 750.0000 mg | ORAL_TABLET | Freq: Every day | ORAL | Status: DC

## 2012-08-11 MED ORDER — DEMECLOCYCLINE HCL 300 MG PO TABS: 300.0000 mg | ORAL_TABLET | Freq: Two times a day (BID) | ORAL | Status: DC

## 2012-08-11 MED ORDER — LEVALBUTEROL HCL 0.63 MG/3ML IN NEBU: 0.6300 mg | INHALATION_SOLUTION | RESPIRATORY_TRACT | Status: DC | PRN

## 2012-08-11 NOTE — Discharge Summary (Signed)
DISCHARGE SUMMARY  [name]  MR#: [id number]  DOB:1924-01-31  Date of Admission: 08/02/2012 Date of Discharge: 08/11/2012  Attending Physician:Nishant Schrecengost A  Patient's BMW:UXLKGMW,NUUVOZD A, MD  Consults:  pulmonary/critical care medicine Dr. Sherene Sires  Discharge Diagnoses: Principal Problem:  *Pneumonia Active Problems:  BACTEREMIA, MYCOBACTERIUM AVIUM COMPLEX  ALLERGIC RHINITIS  COPD  GERD  HTN (hypertension)  Dyspnea  Mental status change  Hyponatremia  Anemia   Discharge Medications:   Medication List     As of 08/11/2012 11:23 AM    STOP taking these medications         meloxicam 7.5 MG tablet   Commonly known as: MOBIC      TAKE these medications         acetaminophen 500 MG tablet   Commonly known as: TYLENOL   Take 500 mg by mouth every 6 (six) hours as needed. pain      ADVAIR DISKUS 250-50 MCG/DOSE Aepb   Generic drug: Fluticasone-Salmeterol   Inhale 1 puff into the lungs every 12 (twelve) hours.      aspirin 325 MG tablet   Take 325 mg by mouth daily.      demeclocycline 300 MG tablet   Commonly known as: DECLOMYCIN   Take 1 tablet (300 mg total) by mouth 2 (two) times daily.      diltiazem 300 MG 24 hr capsule   Commonly known as: CARDIZEM CD   Take 300 mg by mouth daily.      famotidine 20 MG tablet   Commonly known as: PEPCID   Take 20 mg by mouth at bedtime.      fluticasone 50 MCG/ACT nasal spray   Commonly known as: FLONASE   Place 2 sprays into the nose 2 (two) times daily.      guaiFENesin 600 MG 12 hr tablet   Commonly known as: MUCINEX   Take 1,200 mg by mouth 2 (two) times daily.      levalbuterol 0.63 MG/3ML nebulizer solution   Commonly known as: XOPENEX   Take 3 mLs (0.63 mg total) by nebulization every 3 (three) hours as needed for wheezing or shortness of breath.      levofloxacin 750 MG tablet   Commonly known as: LEVAQUIN   Take 1 tablet (750 mg total) by mouth daily.      levothyroxine 50 MCG tablet   Commonly known as: SYNTHROID, LEVOTHROID   Take 50 mcg by mouth daily.      memantine 10 MG tablet   Commonly known as: NAMENDA   Take 10 mg by mouth 2 (two) times daily.      nitroGLYCERIN 0.4 MG SL tablet   Commonly known as: NITROSTAT   Place 0.4 mg under the tongue every 5 (five) minutes as needed. May repeat x3      PreserVision AREDS Caps   Take 1 capsule by mouth daily.        Hospital Procedures: Dg Chest 2 View  08/10/2012  *RADIOLOGY REPORT*  Clinical Data: Cough, congestion, follow up infiltrates  CHEST - 2 VIEW  Comparison: Multiple priors, most recently 08/08/2012  Findings: Stable cavitary lesion in the right upper lobe.  Patchy left upper and lower lobe opacities, suspicious for pneumonia, unchanged.  Underlying chronic interstitial markings/emphysematous changes.  Stable borderline cardiomegaly.  Suspected small left pleural effusion.  No pneumothorax is seen.  Degenerative changes of the visualized thoracolumbar spine.  IMPRESSION: Patchy left upper and lower lobe opacities, suspicious for pneumonia, unchanged. Suspected small left  pleural effusion.  Stable cavitary right upper lobe lesion.  Underlying chronic interstitial markings/emphysematous changes.   Original Report Authenticated By: Charline Bills, M.D.    Dg Chest 2 View  08/07/2012  *RADIOLOGY REPORT*  Clinical Data: History of lymphoma, COPD, hypertension  CHEST - 2 VIEW  Comparison: Chest x-ray of 08/05/2012  Findings: The lungs appear better aerated.  However there is airspace disease scattered throughout the left lung field suspicious for pneumonia.  The right lung apex is difficult to assess but there is a persistent opacity which unfortunately is overlain by the chin and not well evaluated.  There may be small effusions present.  Heart size is mildly enlarged.  IMPRESSION:  1.  Airspace disease throughout the left lung suspicious for pneumonia. 2.  The opacity in the right upper lung field is not as well seen  as noted above but persists.   Original Report Authenticated By: Juline Patch, M.D.    Dg Chest 2 View  08/05/2012  *RADIOLOGY REPORT*  Clinical Data: Follow up pneumonia.  CHEST - 2 VIEW  Comparison: Two-view chest 08/03/2012.  Findings: A cavitary lesion in the right upper lobe persists.    It appears slightly smaller than on the prior exam.  Left upper lobe airspace disease is more prominent than on the prior study. Bilateral effusions are present.  The heart is enlarged.  Left basilar airspace disease has increased.  IMPRESSION:  1.  Stable slight decrease in the size of a cavitary lesion of the right upper lobe. 2.  Increasing left upper and left lower lobe airspace disease. 3.  Bilateral pleural effusions are more prominent, left greater than right.   Original Report Authenticated By: Jamesetta Orleans. MATTERN, M.D.    X-ray Chest Pa And Lateral   08/03/2012  *RADIOLOGY REPORT*  Clinical Data: Follow up pneumonia, cough, congestion  CHEST - 2 VIEW  Comparison: 08/02/2012  Findings: Enlarged cardiac silhouette. Diffuse left lung infiltrate persists. Cavitary lesion with air/fluid level in right upper lobe again identified, 6.4 x 4.2 x 4.3 cm. Improved aeration in remainder of right lung. Small bibasialr effusions.  IMPRESSION: Persistent cavitary lesion right upper lobe question tumor versus infection/abscess; recommend assessment by CT chest (with contrast if renal function permits). Persistent left lung infiltrate and small bibasilar effusions.   Original Report Authenticated By: Lollie Marrow, M.D.    Dg Chest 2 View  07/31/2012  *RADIOLOGY REPORT*  Clinical Data: Productive cough with fever.  CHEST - 2 VIEW  Comparison: Chest radiographs 05/01/2012 and 07/04/2012.  Findings: Cavitary right upper lobe lesion is again noted.  Air fluid level within this cavitary lesion has increased in height. The surrounding right apical parenchymal density appears about the same.  There is stable bilateral hilar  retraction.  Calcified lymph nodes are present in both hila.  There is diffuse nodularity of the interstitium which appears chronic.  No significant pleural effusion is seen.  IMPRESSION: Stable chronic lung disease and cavitary right upper lobe lesion. The air-fluid level within this cavitary lesion has increased, possibly secondary to superimposed infection or bleeding. Consider follow-up CT.   Original Report Authenticated By: Gerrianne Scale, M.D.    Ct Head Wo Contrast  08/03/2012  *RADIOLOGY REPORT*  Clinical Data: Altered mental status. Atrial fibrillation.  CT HEAD WITHOUT CONTRAST  Technique:  Contiguous axial images were obtained from the base of the skull through the vertex without contrast.  Comparison: 07/04/2012.  Findings: There is no evidence for acute infarction, intracranial hemorrhage,  mass lesion, hydrocephalus, or extra-axial fluid. Moderate atrophy is present.  Chronic microvascular ischemic changes noted in the periventricular and subcortical white matter. Scattered lacunes.  No acute sinus disease.  Right mastoid fluid appears increased from priors.  IMPRESSION: No acute or focal intracranial abnormality.  Atrophy with chronic microvascular ischemic change.  Slight right mastoid fluid of uncertain significance.   Original Report Authenticated By: Elsie Stain, M.D.    Dg Chest Port 1 View  08/08/2012  *RADIOLOGY REPORT*  Clinical Data: Pleural effusions.  Pneumonia.  PORTABLE CHEST - 1 VIEW  Comparison: 08/07/2012  Findings: Cavitary right upper lobe lesion noted.  Continued prominent airspace opacity at the left lung base with patchy airspace opacities throughout much of the left lung.  Underlying interstitial accentuation noted.  Left heart border obscured.  The patient's chin and facial tissues obscure the medial lung apices.  Pleural thickening noted on the left.  IMPRESSION:  1.  Slightly worsened aeration at the left lung base with otherwise persistent airspace opacities  throughout much of the left lung. 2.  Cavitary lesion in the right upper lobe. 3.  Underlying interstitial accentuation persists. 4.  Probable left pleural effusion.   Original Report Authenticated By: Dellia Cloud, M.D.    Dg Chest Portable 1 View  08/02/2012  *RADIOLOGY REPORT*  Clinical Data: Fever.  Fatigue.  Hypoxia.  PORTABLE CHEST - 1 VIEW  Comparison: 07/31/2012  Findings: Cavitary lesion in the right upper lobe measures approximately 6.2 x 3.8 cm.  Previously seen internal air-fluid level is not readily apparent although given in semi erect positioning would not necessarily be apparent.  Adjacent pleural thickening appears stable.  Hazy interstitial opacity is present throughout both lungs, increased from prior.  Airspace opacity obscures the left heart border and left lung base, new compared to prior.  There is evidence of old granulomatous disease.  Atherosclerotic calcification of the aortic arch noted.  IMPRESSION:  1. Essentially stable in size cavitary lesion in the right upper lobe, possibly from malignancy or infection such as fungal pneumonia or staphylococcal pneumonia.  Adjacent pleural thickening noted. 2.  Increased bilateral interstitial accentuation with airspace opacity in the lingula and left lower lobe, possibly from pneumonia.   Original Report Authenticated By: Dellia Cloud, M.D.     History of Present Illness: This is an 78 year old Caucasian male with a fairly complicated past medical history including COPD, cavitary lesion in the right upper lobe being managed by pulmonology, history of MAI infection but thought to be quiet at this time, [facility] with his wife, admitted in August 4 what appears to be atrial fibrillation and a TIA, with decision not to anticoagulate given recurrent falls. Mental status changes did resolve with probable TIA indicating expressive aphasia. MRI not performed secondary to cervical spine issues at the time. Patient  also has a past medical history significant for coronary artery disease and chronic hypernatremia at baseline with baseline sodium ranging from 120-132. He was discharged at that time [facility] [place], and discharged back to his independent living facility on this past Tuesday. 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 Presented with fevers, cough, some shortness of breath. Patient was initiated on a ten-day regimen of Levaquin at the time, and offered hospitalization per note however patient declined. Discharged back to independent living, where her appetite was stable, however patient has not been using his Advair or  Symbicort. Continued to have fevers low grade of the weekend however this morning was barely arousable, could not take his medications, and presented to the emergency room for further evaluation and management.  In the emergency room, patient did have a mild leukocytosis, fever of 102.32F, and chest x-ray indicating possibly a new left lower lobe pneumonia compared with previous chest x-ray obtained 2 days prior. This is complicated by continued hypernatremia with a value of 120, previous body 129. Patient was started on normal saline IV fluids along with treatment for community-acquired pneumonia with Rocephin and azithromycin by emergency room physician. Patient hemodynamically stable.   Hospital Course: *Patient was initially admitted to the floor placed on IV antibiotics and actually improved somewhat initially but remains significant hyponatremic. Within the first 2-3 days he declined again with increasing chest congestion and worsening pneumonia but was switched to combination of vancomycin and Zosyn with stability. He did have a swallowing study with no evidence of significant aspiration was placed a modified diet. Right care were consulted with nothing further to add beyond continue  therapy. Chest did clear and he remained afebrile with no elevation of his white blood cell count. Clearly the right upper lobe cavitary lesion and chronic findings have been there for quite some time and are unchanged. The acute infiltrates if stabilized clinically for pulmonary standpoint he has improved. He was converted to Levaquin and remained stable with this regards. From a metabolic standpoint sodium has remained stable in the low to mid 120s with no mental status changes beyond his baseline. He has been euvolemic and did not respond to saline hydration. 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 He has been placed on the meclocycline and a fluid restriction of 1500 cc daily. I did have an extensive discussion with wife about his overall poor prognosis and overall decline despite aggressive interventions that with 2 hospitalizations. He is a no CODE BLUE. Our focus will be on completing the Levaquin for an additional 9 days from discharge to complete a two-week course and to continue DD meclocycline 300 twice a day for SIADH further titration depending upon his response. She understands that his overall prognosis is poor the remainder the focus should be on comfort and physical/occupational therapy but is unclear that his overall functionality quality of life will be any better than it is at this point as she's been in a state of stated decline for some time now. Certainly all acute he has been treated in maximal hospital benefit has been achieved. The goal is to go back to skilled nursing to complete antibiotics, watch and manage the hyponatremia as well as possible and focus on a palliative course should he not improve with no return to the hospital unless this truly something to be benefited. Day of  Discharge Exam BP 111/63  Pulse 90  Temp 99.3 F (37.4 C) (Oral)  Resp 18  Ht 6' (1.829 m)  Wt 80.151 kg (176 lb 11.2 oz)  BMI 23.96 kg/m2  SpO2 96%  Physical Exam: Patient is awake and alert frail. Bitemporal wasting. Orlene Erm is limited but he will respond clearly knows me not circumstances. Lungs reveal clear breath sounds prolonged expiratory phase no rales wheezing or rhonchi. Respiratory status is unlabored. Heart sounds are distant irregular rate in the 70s  no obvious murmur. Abdomen is soft and nontender. Back is kyphotic. Extremities without edema intact pulses. Neurologically diffusely weak.  Discharge Labs:  [facility] 08/11/12 0505 08/10/12 0750  NA 122* 122*  K 3.5 3.5  CL 89* 89*  CO2 23 23  GLUCOSE 96 94  BUN 11 8  CREATININE 0.96 0.93  CALCIUM 8.3* 8.4  MG -- --  PHOS -- --    Basename 08/11/12 0505 08/10/12 0750  AST 24 31  ALT 17 20  ALKPHOS 60 61  BILITOT 0.2* 0.3  PROT 6.1 6.3  ALBUMIN 1.9* 2.0*    Basename 08/11/12 0505 08/10/12 0750  WBC 7.3 7.7  NEUTROABS -- --  HGB 9.8* 9.9*  HCT 27.1* 27.2*  MCV 96.1 96.1  PLT 369 348   No results found for this basename: CKTOTAL:3,CKMB:3,CKMBINDEX:3,TROPONINI:3 in the last 72 hours  Basename 08/09/12 0510  TSH 2.408  T4TOTAL --  T3FREE --  THYROIDAB --   No results found for this basename: VITAMINB12:2,FOLATE:2,FERRITIN:2,TIBC:2,IRON:2,RETICCTPCT:2 in the last 72 hours  Discharge instructions:     Discharge Orders    Future Appointments: Provider: Department: Dept Phone: Center:   08/18/2012 2:00 PM Nyoka Cowden, MD [facility] [phone] None   10/02/2012 9:00 AM Delcie Roch [facility] [phone] None   10/02/2012 9:30 AM Laurice Record, MD [facility] [phone] None     Future Orders Please Complete By Expires   Diet - low sodium heart healthy      Increase activity slowly         [facility]  Follow-up Appts: To be  seen by the physicians at the skilled nursing unit Condition on Discharge: *Guarded but improved Tests Needing Follow-up: *Patient's electrolytes will need to be followed closely to further titrate or discontinue the demeclocycline depending upon his response. He should receive aggressive OT and PT as he's able to tolerate. It should be noted that the right upper lobe lesion is his Mycobacterium avium complex and is chronic. Patient is a DO NOT RESUSCITATE a palliative course should be undertaken hereafter. Signed: Cyrah Mclamb A 08/11/2012, 11:23 AM

## 2012-08-11 NOTE — Plan of Care (Signed)
Problem: Discharge Progression Outcomes Goal: Pneumonia vaccine received if indicated Outcome: Not Met (add Reason) On admission, vaccine noted to have been given within last 5 yrs.

## 2012-08-11 NOTE — Progress Notes (Signed)
Given report to Danielle at [facility]. Gave my number if she had any additional questions.

## 2012-08-11 NOTE — Progress Notes (Signed)
Pt profile: 78 yo WM who [facility]. Admitted 9/15 with CAP vs bronchiectasis exacerbation   Subj: No distress, no complaints. Afebrile > 24 hrs   Obj: Filed Vitals:   08/11/12 0600  BP: 111/63  Pulse: 90  Temp: 99.3 F (37.4 C)  Resp: 18    Gen: NAD HEENT: WNL Neck: no JVD Chest: bibasilar rales, no wheezes Cardiac: RRR Abd: NABS Ext: no edema  BMET    Component Value Date/Time   NA 122* 08/11/2012 0505   K 3.5 08/11/2012 0505   CL 89* 08/11/2012 0505   CO2 23 08/11/2012 0505   GLUCOSE 96 08/11/2012 0505   BUN 11 08/11/2012 0505   CREATININE 0.96 08/11/2012 0505   CALCIUM 8.3* 08/11/2012 0505   GFRNONAA 73* 08/11/2012 0505   GFRAA 85* 08/11/2012 0505    CBC    Component Value Date/Time   WBC 7.3 08/11/2012 0505   WBC 6.5 10/04/2011 1054   RBC 2.82* 08/11/2012 0505   RBC 3.27* 10/04/2011 1054   HGB 9.8* 08/11/2012 0505   HGB 11.2* 10/04/2011 1054   HCT 27.1* 08/11/2012 0505   HCT 33.2* 10/04/2011 1054   PLT 369 08/11/2012 0505   PLT 229 10/04/2011 1054   MCV 96.1 08/11/2012 0505   MCV 101.7* 10/04/2011 1054   MCH 34.8* 08/11/2012 0505   MCH 34.3* 10/04/2011 1054   MCHC 36.2* 08/11/2012 0505   MCHC 33.8 10/04/2011 1054   RDW 14.4 08/11/2012 0505   RDW 15.3* 10/04/2011 1054   LYMPHSABS 0.9 08/02/2012 1215   LYMPHSABS 1.5 10/04/2011 1054   MONOABS 1.8* 08/02/2012 1215   MONOABS 0.7 10/04/2011 1054   EOSABS 0.0 08/02/2012 1215   EOSABS 0.1 10/04/2011 1054   BASOSABS 0.0 08/02/2012 1215   BASOSABS 0.0 10/04/2011 1054    CXR:  Patchy left upper and lower lobe opacities, suspicious for  pneumonia, unchanged. Suspected small left pleural effusion.  Stable cavitary right upper lobe lesion.  Underlying chronic interstitial markings/emphysematous changes.   IMPRESSION: Suspected PNA Bronchiectasis RUL cavity  PLAN/RECS:   Should be OK on oral abx in anticipation of discharge soon It is not clear to me whether PPD has been performed but given RUL  cavity and NH status, need to consider this  Brett Canales Minor ACNP Adolph Pollack PCCM Pager [phone] till 3 pm If no answer page [phone] 08/11/2012, 9:58 AM   Billy Fischer, MD ; [facility] service Mobile [phone].  After 5:30 PM or weekends, call [phone]

## 2012-08-11 NOTE — Progress Notes (Signed)
Physical Therapy Treatment Patient Details Name: [name] MRN: [id number] DOB: 1924-09-28 Today's Date: 08/11/2012 Time: 7829-5621 PT Time Calculation (min): 25 min  PT Assessment / Plan / Recommendation Comments on Treatment Session  Some improvement today compared to yesterday. Pt performance tends to fluctuate day-to-day. Continue to recommend SNF.    Follow Up Recommendations  Skilled nursing facility    Barriers to Discharge        Equipment Recommendations  Defer to next venue    Recommendations for Other Services    Frequency Min 3X/week   Plan Discharge plan remains appropriate    Precautions / Restrictions Precautions Precautions: Fall Restrictions Weight Bearing Restrictions: No   Pertinent Vitals/Pain Pt denies    Mobility  Bed Mobility Bed Mobility: Supine to Sit Supine to Sit: 1: +2 Total assist Supine to Sit: Patient Percentage: 20% Details for Bed Mobility Assistance: Multimodal cues for technique, hand placement. Assist for bil LEs off bed and trunk to upright. Utilized bed pad for scooting, positiioning.  Transfers Transfers: Sit to Stand;Stand to Sit Sit to Stand: 1: +2 Total assist;From elevated surface;From bed;With upper extremity assist Sit to Stand: Patient Percentage: 30% Stand to Sit: 1: +2 Total assist;With upper extremity assist;To bed;To chair/3-in-1 Stand to Sit: Patient Percentage: 40% Transfer via Lift Equipment: Stedy Details for Transfer Assistance: x3. Multimodal cues for technique, hand placement, initiatiion of task. Assist to position feet, rise, stabilize, control descent. Facilitation to encourage trunk extension. Pt able to stand for 15 seconds x 3 with rests in between. Fatigues easily.  Ambulation/Gait Ambulation/Gait Assistance: Not tested (comment)    Exercises General Exercises - Lower Extremity Ankle Circles/Pumps: AROM;Both;10 reps;Seated Quad Sets: AROM;Both (2 reps) Long Arc Quad: AROM;AAROM;Both;5  reps;Seated Hip ABduction/ADduction: AAROM;Both;5 reps;Seated   PT Diagnosis:    PT Problem List:   PT Treatment Interventions:     PT Goals Acute Rehab PT Goals Pt will go Supine/Side to Sit: with max assist PT Goal: Supine/Side to Sit - Progress: Progressing toward goal Pt will go Sit to Stand: with max assist PT Goal: Sit to Stand - Progress: Progressing toward goal Pt will go Stand to Sit: with max assist PT Goal: Stand to Sit - Progress: Progressing toward goal  Visit Information  Last PT Received On: 08/11/12 Assistance Needed: +2    Subjective Data  Subjective: "Hey" Patient Stated Goal: None   Cognition  Overall Cognitive Status: Impaired Area of Impairment: Attention;Following commands Difficult to assess due to: Level of arousal Arousal/Alertness: Lethargic Orientation Level: Disoriented to;Place;Time;Situation Behavior During Session: Lethargic Current Attention Level: Focused Following Commands: Follows one step commands inconsistently Awareness of Errors: Assistance required to identify errors made;Assistance required to correct errors made    Balance  Balance Balance Assessed: Yes Static Sitting Balance Static Sitting - Balance Support: Bilateral upper extremity supported;Feet supported Static Sitting - Level of Assistance: 4: Min assist Static Sitting - Comment/# of Minutes: Alternates between Min A and Min-guard assist for static sitting balance. Pt fatigues quickly. Tends to lean and prop on R elbow  End of Session PT - End of Session Equipment Utilized During Treatment: Oxygen Activity Tolerance: Patient limited by fatigue Patient left: in chair;with call bell/phone within reach Nurse Communication: Need for lift equipment   GP     Rebeca Alert [facility] 08/11/2012, 10:36 AM [phone]

## 2012-08-12 LAB — ACTH STIMULATION, 3 TIME POINTS
Cortisol, 30 Min: 6.2 ug/dL — ABNORMAL LOW (ref >20)
Cortisol, 60 Min: 8.2 ug/dL — ABNORMAL LOW (ref >20)
Cortisol, Base: 2.4 ug/dL

## 2012-08-17 DIAGNOSIS — B372 Candidiasis of skin and nail: Secondary | ICD-10-CM

## 2012-08-17 HISTORY — DX: Candidiasis of skin and nail: B37.2

## 2012-08-18 ENCOUNTER — Ambulatory Visit: Payer: Medicare Other | Admitting: Internal Medicine

## 2012-08-20 DIAGNOSIS — D649 Anemia, unspecified: Secondary | ICD-10-CM

## 2012-08-20 DIAGNOSIS — R1310 Dysphagia, unspecified: Secondary | ICD-10-CM

## 2012-08-20 DIAGNOSIS — E508 Other manifestations of vitamin A deficiency: Secondary | ICD-10-CM

## 2012-08-20 DIAGNOSIS — R269 Unspecified abnormalities of gait and mobility: Secondary | ICD-10-CM

## 2012-08-20 DIAGNOSIS — G250 Essential tremor: Secondary | ICD-10-CM

## 2012-08-20 DIAGNOSIS — M6281 Muscle weakness (generalized): Secondary | ICD-10-CM

## 2012-08-20 DIAGNOSIS — M4000 Postural kyphosis, site unspecified: Secondary | ICD-10-CM

## 2012-08-20 DIAGNOSIS — R21 Rash and other nonspecific skin eruption: Secondary | ICD-10-CM

## 2012-08-20 DIAGNOSIS — R942 Abnormal results of pulmonary function studies: Secondary | ICD-10-CM

## 2012-08-20 DIAGNOSIS — J438 Other emphysema: Secondary | ICD-10-CM

## 2012-08-20 DIAGNOSIS — G252 Other specified forms of tremor: Secondary | ICD-10-CM

## 2012-08-20 HISTORY — DX: Muscle weakness (generalized): M62.81

## 2012-08-20 HISTORY — DX: Abnormal results of pulmonary function studies: R94.2

## 2012-08-20 HISTORY — DX: Dysphagia, unspecified: R13.10

## 2012-08-20 HISTORY — DX: Essential tremor: G25.2

## 2012-08-20 HISTORY — DX: Postural kyphosis, site unspecified: M40.00

## 2012-08-20 HISTORY — DX: Unspecified abnormalities of gait and mobility: R26.9

## 2012-08-20 HISTORY — DX: Rash and other nonspecific skin eruption: R21

## 2012-08-20 HISTORY — DX: Essential tremor: G25.0

## 2012-08-20 HISTORY — DX: Other emphysema: J43.8

## 2012-08-20 HISTORY — DX: Anemia, unspecified: D64.9

## 2012-08-20 HISTORY — DX: Other manifestations of vitamin A deficiency: E50.8

## 2012-09-15 NOTE — Progress Notes (Signed)
Addendum to discharge-- altered mental status was toxic metabolic encephalopathy superimposed on mild dementia.

## 2012-09-22 ENCOUNTER — Telehealth: Payer: Self-pay | Admitting: Hematology and Oncology

## 2012-09-22 NOTE — Telephone Encounter (Signed)
Moved 11/15 appt to 11/29 due to poof. S/w wife and per wife pt has had a couple of hospital visits and is now @ [facility]. Per wife cx appts due to pt's prognosis is not good. Desk nurse informed and appts for 11/29 cx'd.

## 2012-10-02 ENCOUNTER — Ambulatory Visit: Payer: Medicare Other | Admitting: Hematology and Oncology

## 2012-10-02 ENCOUNTER — Other Ambulatory Visit: Payer: Medicare Other | Admitting: Lab

## 2012-10-05 ENCOUNTER — Other Ambulatory Visit: Payer: Medicare Other | Admitting: Lab

## 2012-10-07 DIAGNOSIS — Z9181 History of falling: Secondary | ICD-10-CM

## 2012-10-07 DIAGNOSIS — S60229A Contusion of unspecified hand, initial encounter: Secondary | ICD-10-CM

## 2012-10-07 HISTORY — DX: History of falling: Z91.81

## 2012-10-07 HISTORY — DX: Contusion of unspecified hand, initial encounter: S60.229A

## 2012-10-16 ENCOUNTER — Other Ambulatory Visit: Payer: Medicare Other | Admitting: Lab

## 2012-10-16 ENCOUNTER — Ambulatory Visit: Payer: Medicare Other | Admitting: Hematology and Oncology

## 2012-12-21 DIAGNOSIS — H6120 Impacted cerumen, unspecified ear: Secondary | ICD-10-CM

## 2012-12-21 DIAGNOSIS — M26629 Arthralgia of temporomandibular joint, unspecified side: Secondary | ICD-10-CM

## 2012-12-21 HISTORY — DX: Impacted cerumen, unspecified ear: H61.20

## 2012-12-21 HISTORY — DX: Arthralgia of temporomandibular joint, unspecified side: M26.629

## 2013-04-02 ENCOUNTER — Emergency Department (HOSPITAL_COMMUNITY): Payer: Medicare Other

## 2013-04-02 ENCOUNTER — Emergency Department (HOSPITAL_COMMUNITY)
Admission: EM | Admit: 2013-04-02 | Discharge: 2013-04-03 | Disposition: A | Discharge status: Home / Self Care | Payer: Medicare Other | Attending: Emergency Medicine | Admitting: Emergency Medicine

## 2013-04-02 ENCOUNTER — Encounter (HOSPITAL_COMMUNITY): Payer: Self-pay | Admitting: Emergency Medicine

## 2013-04-02 DIAGNOSIS — Z79899 Other long term (current) drug therapy: Secondary | ICD-10-CM | POA: Insufficient documentation

## 2013-04-02 DIAGNOSIS — J449 Chronic obstructive pulmonary disease, unspecified: Secondary | ICD-10-CM | POA: Insufficient documentation

## 2013-04-02 DIAGNOSIS — Z86718 Personal history of other venous thrombosis and embolism: Secondary | ICD-10-CM | POA: Insufficient documentation

## 2013-04-02 DIAGNOSIS — Z8739 Personal history of other diseases of the musculoskeletal system and connective tissue: Secondary | ICD-10-CM | POA: Insufficient documentation

## 2013-04-02 DIAGNOSIS — I10 Essential (primary) hypertension: Secondary | ICD-10-CM | POA: Insufficient documentation

## 2013-04-02 DIAGNOSIS — Z8719 Personal history of other diseases of the digestive system: Secondary | ICD-10-CM | POA: Insufficient documentation

## 2013-04-02 DIAGNOSIS — J40 Bronchitis, not specified as acute or chronic: Secondary | ICD-10-CM

## 2013-04-02 DIAGNOSIS — F329 Major depressive disorder, single episode, unspecified: Secondary | ICD-10-CM | POA: Insufficient documentation

## 2013-04-02 DIAGNOSIS — J4489 Other specified chronic obstructive pulmonary disease: Secondary | ICD-10-CM | POA: Insufficient documentation

## 2013-04-02 DIAGNOSIS — I701 Atherosclerosis of renal artery: Secondary | ICD-10-CM | POA: Insufficient documentation

## 2013-04-02 DIAGNOSIS — Z8709 Personal history of other diseases of the respiratory system: Secondary | ICD-10-CM | POA: Insufficient documentation

## 2013-04-02 DIAGNOSIS — R05 Cough: Secondary | ICD-10-CM | POA: Insufficient documentation

## 2013-04-02 DIAGNOSIS — R5381 Other malaise: Secondary | ICD-10-CM | POA: Insufficient documentation

## 2013-04-02 DIAGNOSIS — F3289 Other specified depressive episodes: Secondary | ICD-10-CM | POA: Insufficient documentation

## 2013-04-02 DIAGNOSIS — Z87891 Personal history of nicotine dependence: Secondary | ICD-10-CM | POA: Insufficient documentation

## 2013-04-02 DIAGNOSIS — E785 Hyperlipidemia, unspecified: Secondary | ICD-10-CM | POA: Insufficient documentation

## 2013-04-02 DIAGNOSIS — R059 Cough, unspecified: Secondary | ICD-10-CM | POA: Insufficient documentation

## 2013-04-02 DIAGNOSIS — IMO0002 Reserved for concepts with insufficient information to code with codable children: Secondary | ICD-10-CM | POA: Insufficient documentation

## 2013-04-02 DIAGNOSIS — Z9889 Other specified postprocedural states: Secondary | ICD-10-CM | POA: Insufficient documentation

## 2013-04-02 DIAGNOSIS — Z8679 Personal history of other diseases of the circulatory system: Secondary | ICD-10-CM | POA: Insufficient documentation

## 2013-04-02 DIAGNOSIS — E039 Hypothyroidism, unspecified: Secondary | ICD-10-CM | POA: Insufficient documentation

## 2013-04-02 DIAGNOSIS — R509 Fever, unspecified: Secondary | ICD-10-CM

## 2013-04-02 DIAGNOSIS — Z87898 Personal history of other specified conditions: Secondary | ICD-10-CM | POA: Insufficient documentation

## 2013-04-02 LAB — CBC WITH DIFFERENTIAL/PLATELET
Basophils Absolute: 0 10*3/uL (ref 0.0–0.1)
Eosinophils Relative: 2 % (ref 0–5)
HCT: 29.7 % — ABNORMAL LOW (ref 39.0–52.0)
Lymphocytes Relative: 20 % (ref 12–46)
Lymphs Abs: 1.9 10*3/uL (ref 0.7–4.0)
MCV: 98 fL (ref 78.0–100.0)
Monocytes Relative: 12 % (ref 3–12)
Platelets: 202 10*3/uL (ref 150–400)
RBC: 3.03 MIL/uL — ABNORMAL LOW (ref 4.22–5.81)
RDW: 15.5 % (ref 11.5–15.5)
WBC: 9.5 10*3/uL (ref 4.0–10.5)

## 2013-04-02 LAB — URINALYSIS, ROUTINE W REFLEX MICROSCOPIC
Bilirubin Urine: NEGATIVE
Hgb urine dipstick: NEGATIVE
Ketones, ur: NEGATIVE mg/dL
Nitrite: NEGATIVE
Specific Gravity, Urine: 1.022 (ref 1.005–1.030)
Urobilinogen, UA: 0.2 mg/dL (ref 0.0–1.0)

## 2013-04-02 LAB — COMPREHENSIVE METABOLIC PANEL
ALT: 8 U/L (ref 0–53)
AST: 14 U/L (ref 0–37)
Albumin: 3.1 g/dL — ABNORMAL LOW (ref 3.5–5.2)
Alkaline Phosphatase: 57 U/L (ref 39–117)
CO2: 26 mEq/L (ref 19–32)
Chloride: 98 mEq/L (ref 96–112)
Creatinine, Ser: 1.33 mg/dL (ref 0.50–1.35)
GFR calc non Af Amer: 47 mL/min — ABNORMAL LOW (ref >90)
Potassium: 3.9 mEq/L (ref 3.5–5.1)
Total Bilirubin: 0.4 mg/dL (ref 0.3–1.2)

## 2013-04-02 LAB — URINE MICROSCOPIC-ADD ON

## 2013-04-02 NOTE — ED Notes (Signed)
Per family pt had fever 102 this evening, "rattling" per [facility]. Wife reports decreased LOC

## 2013-04-02 NOTE — ED Notes (Signed)
Patient transported to X-ray 

## 2013-04-02 NOTE — ED Notes (Signed)
Patient from [facility]. Started running a fever around 1600. Patient was given Tylenol at home before being brought to ED. Patient denies pain. Denies nausea, vomiting, diarrhea. No difficulty with urinating. Patient does report a cough that has been present for "a few days". Dr Judd Lien at bedside.

## 2013-04-02 NOTE — ED Provider Notes (Signed)
History     CSN: [id number]  Arrival date & time 04/02/13  2140   First MD Initiated Contact with Patient 04/02/13 2155      Chief Complaint  Patient presents with  . Fever    (Consider location/radiation/quality/duration/timing/severity/associated sxs/prior treatment) HPI Comments: Patient with history of copd, cad with stent in past.  Presents with complaints of fever at home that started this afternoon.  Wife reports "rattling" in the chest.  This is how he was when he was admitted in the past for pneumonia.  Per wife, seems weaker than normal, and not quite himself.  Patient is a 79 y.o. male presenting with fever. The history is provided by the patient.  Fever Max temp prior to arrival:  102 Temp source:  Oral Severity:  Moderate Onset quality:  Sudden Duration:  8 hours Timing:  Constant Progression:  Worsening Chronicity:  New Relieved by:  Nothing Worsened by:  Nothing tried Ineffective treatments:  None tried Associated symptoms: chills and cough   Associated symptoms: no chest pain, no confusion, no congestion, no dysuria, no headaches, no myalgias and no sore throat     Past Medical History  Diagnosis Date  . Allergic rhinitis   . 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 COPD (chronic obstructive pulmonary disease)   . Diverticula of colon   . Right leg DVT 01/2010  . IHD (ischemic heart disease)   . Hyperlipidemia   . Hypertension   . Depression   . Myalgia   . Hypothyroidism   . Bronchiectasis   . Spinal stenosis   . History of heartburn   . Joint pain   . 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 Coronary artery disease     Past Surgical History  Procedure Laterality Date  . Cardiac catheterization  06/13/2000    normal. ef 60%  . Cardiovascular stress test  04/2010    ef 54%, and normal perfusion  . Coronary angioplasty  1991    1st diagonal  . Carpal tunnel release      right  . Rotator cuff repair      left  shoulder  . Knee surgery  August  2006    S/P right knee replacement  . Laminectomy      2005  . Cataract extraction      Bilateral     Family History  Problem Relation Age of Onset  . Heart disease Father   . Heart failure Father   . Breast cancer Maternal Aunt     History  Substance Use Topics  . Smoking status: Former Smoker -- 0.50 packs/day for 50 years    Types: Cigarettes    Quit date: 11/18/1952  . Smokeless tobacco: Never Used  . Alcohol Use: No      Review of Systems  Constitutional: Positive for fever, chills and fatigue.  HENT: Negative for congestion and sore throat.   Respiratory: Positive for cough.   Cardiovascular: Negative for chest pain.  Genitourinary: Negative for dysuria.  Musculoskeletal: Negative for myalgias.  Neurological: Negative for headaches.  Psychiatric/Behavioral: Negative for confusion.  All other systems reviewed and are negative.    Allergies  Sulfonamide derivatives  Home Medications   Current Outpatient Rx  Name  Route  Sig  Dispense  Refill  . acetaminophen (TYLENOL) 500 MG tablet   Oral   Take 500 mg by mouth every 6 (six) hours as needed. pain         .  aspirin 325 MG tablet   Oral   Take 325 mg by mouth daily.         Marland Kitchen demeclocycline (DECLOMYCIN) 300 MG tablet   Oral   Take 1 tablet (300 mg total) by mouth 2 (two) times daily.         Marland Kitchen diltiazem (CARDIZEM CD) 300 MG 24 hr capsule   Oral   Take 300 mg by mouth daily.          . famotidine (PEPCID) 20 MG tablet   Oral   Take 20 mg by mouth at bedtime.         . fluticasone (FLONASE) 50 MCG/ACT nasal spray   Nasal   Place 2 sprays into the nose 2 (two) times daily.         . Fluticasone-Salmeterol (ADVAIR DISKUS) 250-50 MCG/DOSE AEPB   Inhalation   Inhale 1 puff into the lungs every 12 (twelve) hours.         Marland Kitchen guaiFENesin (MUCINEX) 600 MG 12 hr tablet   Oral   Take 1,200 mg by mouth 2 (two) times daily.         Marland Kitchen levalbuterol  (XOPENEX) 0.63 MG/3ML nebulizer solution   Nebulization   Take 3 mLs (0.63 mg total) by nebulization every 3 (three) hours as needed for wheezing or shortness of breath.   3 mL   3   . levofloxacin (LEVAQUIN) 750 MG tablet   Oral   Take 1 tablet (750 mg total) by mouth daily.         Marland Kitchen levothyroxine (SYNTHROID, LEVOTHROID) 50 MCG tablet   Oral   Take 50 mcg by mouth daily.           . memantine (NAMENDA) 10 MG tablet   Oral   Take 10 mg by mouth 2 (two) times daily.         . Multiple Vitamins-Minerals (PRESERVISION AREDS) CAPS   Oral   Take 1 capsule by mouth daily.         . nitroGLYCERIN (NITROSTAT) 0.4 MG SL tablet   Sublingual   Place 0.4 mg under the tongue every 5 (five) minutes as needed. May repeat x3           BP 134/66  Pulse 61  Temp(Src) 99.3 F (37.4 C) (Oral)  Resp 16  Ht 6' (1.829 m)  Wt 172 lb (78.019 kg)  BMI 23.32 kg/m2  SpO2 96%  Physical Exam  Nursing note and vitals reviewed. Constitutional: He is oriented to person, place, and time. He appears well-developed and well-nourished. No distress.  HENT:  Head: Normocephalic and atraumatic.  Mouth/Throat: Oropharynx is clear and moist.  Neck: Normal range of motion. Neck supple.  Cardiovascular: Normal rate and regular rhythm.   No murmur heard. Pulmonary/Chest: Effort normal and breath sounds normal. No respiratory distress. He has no wheezes.  Abdominal: Soft. Bowel sounds are normal. He exhibits no distension. There is no tenderness.  Musculoskeletal: Normal range of motion. He exhibits no edema.  Lymphadenopathy:    He has no cervical adenopathy.  Neurological: He is alert and oriented to person, place, and time.  Skin: Skin is warm and dry. He is not diaphoretic.    ED Course  Procedures (including critical care time)  Labs Reviewed  CULTURE, BLOOD (ROUTINE X 2)  CULTURE, BLOOD (ROUTINE X 2)  URINE CULTURE  CBC WITH DIFFERENTIAL  COMPREHENSIVE METABOLIC PANEL  URINALYSIS,  ROUTINE W REFLEX MICROSCOPIC   No results found.  No diagnosis found.    MDM  The workup fails to reveal a pneumonia, uti, or other acute process.  He was not febrile upon admission and appears clinically well.  I do not feel as though admission is indicated and have discussed the findings and plan with the family.  They are agreeable to discharge.  I have decided to treat for presumed bronchitis and will prescribe a zpack.  To return prn for any problems.        Geoffery Lyons, MD 04/03/13 [id number]

## 2013-04-03 MED ORDER — AZITHROMYCIN 250 MG PO TABS: 250.0000 mg | ORAL_TABLET | Freq: Every day | ORAL | Status: DC

## 2013-04-03 MED ORDER — AZITHROMYCIN 250 MG PO TABS
500.0000 mg | ORAL_TABLET | Freq: Once | ORAL | Status: AC
  Administered 2013-04-03: 500 mg via ORAL
  Filled 2013-04-03: qty 2

## 2013-04-04 LAB — URINE CULTURE

## 2013-04-09 ENCOUNTER — Inpatient Hospital Stay (HOSPITAL_COMMUNITY)
Admission: EM | Admit: 2013-04-09 | Discharge: 2013-04-14 | DRG: 193 | Disposition: A | Discharge status: Skilled Nursing Facility | Payer: Medicare Other | Attending: Internal Medicine | Admitting: Internal Medicine

## 2013-04-09 ENCOUNTER — Emergency Department (HOSPITAL_COMMUNITY): Payer: Medicare Other

## 2013-04-09 ENCOUNTER — Encounter (HOSPITAL_COMMUNITY): Payer: Self-pay | Admitting: Emergency Medicine

## 2013-04-09 DIAGNOSIS — J852 Abscess of lung without pneumonia: Secondary | ICD-10-CM | POA: Diagnosis present

## 2013-04-09 DIAGNOSIS — J479 Bronchiectasis, uncomplicated: Secondary | ICD-10-CM | POA: Diagnosis present

## 2013-04-09 DIAGNOSIS — F0280 Dementia in other diseases classified elsewhere without behavioral disturbance: Secondary | ICD-10-CM | POA: Diagnosis present

## 2013-04-09 DIAGNOSIS — I4891 Unspecified atrial fibrillation: Secondary | ICD-10-CM | POA: Diagnosis present

## 2013-04-09 DIAGNOSIS — D649 Anemia, unspecified: Secondary | ICD-10-CM | POA: Diagnosis present

## 2013-04-09 DIAGNOSIS — Z87891 Personal history of nicotine dependence: Secondary | ICD-10-CM

## 2013-04-09 DIAGNOSIS — R2681 Unsteadiness on feet: Secondary | ICD-10-CM | POA: Diagnosis present

## 2013-04-09 DIAGNOSIS — E876 Hypokalemia: Secondary | ICD-10-CM | POA: Diagnosis present

## 2013-04-09 DIAGNOSIS — IMO0002 Reserved for concepts with insufficient information to code with codable children: Secondary | ICD-10-CM

## 2013-04-09 DIAGNOSIS — G309 Alzheimer's disease, unspecified: Secondary | ICD-10-CM | POA: Diagnosis present

## 2013-04-09 DIAGNOSIS — E440 Moderate protein-calorie malnutrition: Secondary | ICD-10-CM | POA: Diagnosis present

## 2013-04-09 DIAGNOSIS — E039 Hypothyroidism, unspecified: Secondary | ICD-10-CM | POA: Diagnosis present

## 2013-04-09 DIAGNOSIS — I10 Essential (primary) hypertension: Secondary | ICD-10-CM | POA: Diagnosis present

## 2013-04-09 DIAGNOSIS — Z79899 Other long term (current) drug therapy: Secondary | ICD-10-CM

## 2013-04-09 DIAGNOSIS — K5900 Constipation, unspecified: Secondary | ICD-10-CM | POA: Diagnosis present

## 2013-04-09 DIAGNOSIS — R531 Weakness: Secondary | ICD-10-CM | POA: Diagnosis present

## 2013-04-09 DIAGNOSIS — E236 Other disorders of pituitary gland: Secondary | ICD-10-CM | POA: Diagnosis present

## 2013-04-09 DIAGNOSIS — J189 Pneumonia, unspecified organism: Principal | ICD-10-CM | POA: Diagnosis present

## 2013-04-09 DIAGNOSIS — F329 Major depressive disorder, single episode, unspecified: Secondary | ICD-10-CM | POA: Diagnosis present

## 2013-04-09 DIAGNOSIS — F3289 Other specified depressive episodes: Secondary | ICD-10-CM | POA: Diagnosis present

## 2013-04-09 DIAGNOSIS — R269 Unspecified abnormalities of gait and mobility: Secondary | ICD-10-CM | POA: Diagnosis present

## 2013-04-09 DIAGNOSIS — Z7982 Long term (current) use of aspirin: Secondary | ICD-10-CM

## 2013-04-09 DIAGNOSIS — I2510 Atherosclerotic heart disease of native coronary artery without angina pectoris: Secondary | ICD-10-CM | POA: Diagnosis present

## 2013-04-09 HISTORY — DX: Unspecified osteoarthritis, unspecified site: M19.90

## 2013-04-09 HISTORY — DX: Heart failure, unspecified: I50.9

## 2013-04-09 HISTORY — DX: Pneumonia, unspecified organism: J18.9

## 2013-04-09 LAB — URINE MICROSCOPIC-ADD ON

## 2013-04-09 LAB — CULTURE, BLOOD (ROUTINE X 2): Culture: NO GROWTH

## 2013-04-09 LAB — POCT I-STAT, CHEM 8
Chloride: 101 mEq/L (ref 96–112)
Creatinine, Ser: 1.5 mg/dL — ABNORMAL HIGH (ref 0.50–1.35)
Glucose, Bld: 102 mg/dL — ABNORMAL HIGH (ref 70–99)
Potassium: 3.9 mEq/L (ref 3.5–5.1)

## 2013-04-09 LAB — CBC WITH DIFFERENTIAL/PLATELET
Basophils Relative: 0 % (ref 0–1)
HCT: 28 % — ABNORMAL LOW (ref 39.0–52.0)
Hemoglobin: 9.8 g/dL — ABNORMAL LOW (ref 13.0–17.0)
Lymphocytes Relative: 17 % (ref 12–46)
Lymphs Abs: 1.3 10*3/uL (ref 0.7–4.0)
Monocytes Absolute: 0.9 10*3/uL (ref 0.1–1.0)
Monocytes Relative: 12 % (ref 3–12)
Neutro Abs: 5.1 10*3/uL (ref 1.7–7.7)
Neutrophils Relative %: 69 % (ref 43–77)
RBC: 2.87 MIL/uL — ABNORMAL LOW (ref 4.22–5.81)
WBC: 7.4 10*3/uL (ref 4.0–10.5)

## 2013-04-09 LAB — BLOOD GAS, VENOUS
Acid-Base Excess: 0.6 mmol/L (ref 0.0–2.0)
Bicarbonate: 24.3 mEq/L — ABNORMAL HIGH (ref 20.0–24.0)
Patient temperature: 98.6
TCO2: 21.3 mmol/L (ref 0–100)
pCO2, Ven: 38.1 mmHg — ABNORMAL LOW (ref 45.0–50.0)
pH, Ven: 7.421 — ABNORMAL HIGH (ref 7.250–7.300)

## 2013-04-09 LAB — URINALYSIS, ROUTINE W REFLEX MICROSCOPIC
Glucose, UA: NEGATIVE mg/dL
Specific Gravity, Urine: 1.015 (ref 1.005–1.030)
Urobilinogen, UA: 0.2 mg/dL (ref 0.0–1.0)
pH: 6 (ref 5.0–8.0)

## 2013-04-09 LAB — POCT I-STAT TROPONIN I

## 2013-04-09 MED ORDER — ACETAMINOPHEN 325 MG PO TABS: 650.0000 mg | ORAL_TABLET | Freq: Four times a day (QID) | ORAL | Status: DC | PRN

## 2013-04-09 MED ORDER — TUBERCULIN PPD 5 UNIT/0.1ML ID SOLN
5.0000 [IU] | Freq: Once | INTRADERMAL | Status: AC
  Administered 2013-04-10: 5 [IU] via INTRADERMAL
  Filled 2013-04-09: qty 0.1

## 2013-04-09 MED ORDER — DILTIAZEM HCL ER 180 MG PO CP24
180.0000 mg | ORAL_CAPSULE | Freq: Every morning | ORAL | Status: DC
  Administered 2013-04-10 – 2013-04-14 (×5): 180 mg via ORAL
  Filled 2013-04-09 (×5): qty 1

## 2013-04-09 MED ORDER — GUAIFENESIN ER 600 MG PO TB12
1200.0000 mg | ORAL_TABLET | Freq: Two times a day (BID) | ORAL | Status: DC
  Administered 2013-04-10 – 2013-04-14 (×10): 1200 mg via ORAL
  Filled 2013-04-09 (×11): qty 2

## 2013-04-09 MED ORDER — PROMETHAZINE HCL 25 MG PO TABS: 12.5000 mg | ORAL_TABLET | Freq: Four times a day (QID) | ORAL | Status: DC | PRN

## 2013-04-09 MED ORDER — MOMETASONE FURO-FORMOTEROL FUM 100-5 MCG/ACT IN AERO
2.0000 | INHALATION_SPRAY | Freq: Two times a day (BID) | RESPIRATORY_TRACT | Status: DC
  Administered 2013-04-10 – 2013-04-14 (×10): 2 via RESPIRATORY_TRACT
  Filled 2013-04-09: qty 8.8

## 2013-04-09 MED ORDER — VANCOMYCIN HCL IN DEXTROSE 1-5 GM/200ML-% IV SOLN
1000.0000 mg | INTRAVENOUS | Status: DC
  Administered 2013-04-10 – 2013-04-13 (×4): 1000 mg via INTRAVENOUS
  Filled 2013-04-09 (×5): qty 200

## 2013-04-09 MED ORDER — ENOXAPARIN SODIUM 40 MG/0.4ML ~~LOC~~ SOLN
40.0000 mg | Freq: Every day | SUBCUTANEOUS | Status: DC
  Administered 2013-04-10 – 2013-04-13 (×5): 40 mg via SUBCUTANEOUS
  Filled 2013-04-09 (×6): qty 0.4

## 2013-04-09 MED ORDER — ACETAMINOPHEN 500 MG PO TABS
500.0000 mg | ORAL_TABLET | Freq: Two times a day (BID) | ORAL | Status: DC
  Administered 2013-04-10 – 2013-04-14 (×10): 500 mg via ORAL
  Filled 2013-04-09 (×13): qty 1

## 2013-04-09 MED ORDER — FERROUS SULFATE 325 (65 FE) MG PO TABS
325.0000 mg | ORAL_TABLET | Freq: Every day | ORAL | Status: DC
  Administered 2013-04-10: 325 mg via ORAL
  Filled 2013-04-09 (×2): qty 1

## 2013-04-09 MED ORDER — LEVOTHYROXINE SODIUM 50 MCG PO TABS
50.0000 ug | ORAL_TABLET | Freq: Every day | ORAL | Status: DC
  Administered 2013-04-10 – 2013-04-14 (×5): 50 ug via ORAL
  Filled 2013-04-09 (×8): qty 1

## 2013-04-09 MED ORDER — FAMOTIDINE 20 MG PO TABS
20.0000 mg | ORAL_TABLET | Freq: Every day | ORAL | Status: DC
  Administered 2013-04-10 – 2013-04-13 (×5): 20 mg via ORAL
  Filled 2013-04-09 (×6): qty 1

## 2013-04-09 MED ORDER — ASPIRIN EC 81 MG PO TBEC
81.0000 mg | DELAYED_RELEASE_TABLET | Freq: Every morning | ORAL | Status: DC
  Administered 2013-04-10 – 2013-04-14 (×5): 81 mg via ORAL
  Filled 2013-04-09 (×5): qty 1

## 2013-04-09 MED ORDER — ALUM & MAG HYDROXIDE-SIMETH 200-200-20 MG/5ML PO SUSP
30.0000 mL | Freq: Four times a day (QID) | ORAL | Status: DC | PRN
  Administered 2013-04-11 – 2013-04-13 (×2): 30 mL via ORAL
  Filled 2013-04-09 (×2): qty 30

## 2013-04-09 MED ORDER — PRESERVISION AREDS PO CAPS: 1.0000 | ORAL_CAPSULE | Freq: Every day | ORAL | Status: DC

## 2013-04-09 MED ORDER — ZOLPIDEM TARTRATE 5 MG PO TABS: 5.0000 mg | ORAL_TABLET | Freq: Every evening | ORAL | Status: DC | PRN

## 2013-04-09 MED ORDER — SENNOSIDES-DOCUSATE SODIUM 8.6-50 MG PO TABS
1.0000 | ORAL_TABLET | Freq: Every evening | ORAL | Status: DC | PRN
  Administered 2013-04-11: 1 via ORAL
  Filled 2013-04-09: qty 1

## 2013-04-09 MED ORDER — SODIUM CHLORIDE 0.9 % IV BOLUS (SEPSIS)
2000.0000 mL | Freq: Once | INTRAVENOUS | Status: AC
  Administered 2013-04-09: 2000 mL via INTRAVENOUS

## 2013-04-09 MED ORDER — NITROGLYCERIN 0.4 MG SL SUBL: 0.4000 mg | SUBLINGUAL_TABLET | SUBLINGUAL | Status: DC | PRN

## 2013-04-09 MED ORDER — HYDROCODONE-ACETAMINOPHEN 5-325 MG PO TABS: 1.0000 | ORAL_TABLET | ORAL | Status: DC | PRN

## 2013-04-09 MED ORDER — OCUVITE-LUTEIN PO CAPS
1.0000 | ORAL_CAPSULE | Freq: Every day | ORAL | Status: DC
  Administered 2013-04-10 – 2013-04-14 (×5): 1 via ORAL
  Filled 2013-04-09 (×5): qty 1

## 2013-04-09 MED ORDER — MEMANTINE HCL ER 28 MG PO CP24
1.0000 | ORAL_CAPSULE | Freq: Every morning | ORAL | Status: DC
  Administered 2013-04-10 – 2013-04-14 (×5): 28 mg via ORAL
  Filled 2013-04-09: qty 28

## 2013-04-09 MED ORDER — LEVOFLOXACIN IN D5W 750 MG/150ML IV SOLN
750.0000 mg | INTRAVENOUS | Status: DC
  Administered 2013-04-09: 750 mg via INTRAVENOUS
  Filled 2013-04-09: qty 150

## 2013-04-09 MED ORDER — ACETAMINOPHEN 650 MG RE SUPP: 650.0000 mg | Freq: Four times a day (QID) | RECTAL | Status: DC | PRN

## 2013-04-09 MED ORDER — POTASSIUM CHLORIDE IN NACL 20-0.9 MEQ/L-% IV SOLN
INTRAVENOUS | Status: DC
  Administered 2013-04-09 – 2013-04-14 (×4): via INTRAVENOUS
  Filled 2013-04-09 (×7): qty 1000

## 2013-04-09 NOTE — ED Notes (Signed)
Pt notified that urine sample is needed.  Urinal placed at bedside.

## 2013-04-09 NOTE — Progress Notes (Signed)
ANTIBIOTIC CONSULT NOTE - INITIAL  Pharmacy Consult for Vancomycin Indication: Suspected Pneumonia  Allergies  Allergen Reactions  . Sulfonamide Derivatives Other (See Comments)    unknown  . Penicillins Rash    Patient Measurements: Height: 6' (182.9 cm) Weight: 174 lb 2.6 oz (79 kg) IBW/kg (Calculated) : 77.6  Vital Signs: Temp: 99 F (37.2 C) (05/23 2155) Temp src: Oral (05/23 2155) BP: 107/52 mmHg (05/23 2155) Pulse Rate: 65 (05/23 2155) Intake/Output from previous day:   Intake/Output from this shift: Total I/O In: -  Out: 650 [Urine:650]  Labs:  Recent Labs  04/09/13 1652 04/09/13 1658  WBC 7.4  --   HGB 9.8* 9.5*  PLT 264  --   CREATININE  --  1.50*   Estimated Creatinine Clearance: 38.8 ml/min (by C-G formula based on Cr of 1.5). No results found for this basename: VANCOTROUGH, VANCOPEAK, VANCORANDOM, GENTTROUGH, GENTPEAK, GENTRANDOM, TOBRATROUGH, TOBRAPEAK, TOBRARND, AMIKACINPEAK, AMIKACINTROU, AMIKACIN,  in the last 72 hours   Microbiology: Recent Results (from the past 720 hour(s))  CULTURE, BLOOD (ROUTINE X 2)     Status: None   Collection Time    04/02/13 10:35 PM      Result Value Range Status   Specimen Description BLOOD RIGHT ARM   Final   Special Requests BOTTLES DRAWN AEROBIC AND ANAEROBIC   Final   Culture  Setup Time 04/03/2013 02:04   Final   Culture NO GROWTH 5 DAYS   Final   Report Status 04/09/2013 FINAL   Final  CULTURE, BLOOD (ROUTINE X 2)     Status: None   Collection Time    04/02/13 10:45 PM      Result Value Range Status   Specimen Description BLOOD LEFT ARM   Final   Special Requests BOTTLES DRAWN AEROBIC AND ANAEROBIC   Final   Culture  Setup Time 04/03/2013 02:03   Final   Culture NO GROWTH 5 DAYS   Final   Report Status 04/09/2013 FINAL   Final  URINE CULTURE     Status: None   Collection Time    04/02/13 11:10 PM      Result Value Range Status   Specimen Description URINE, RANDOM   Final   Special Requests  NONE   Final   Culture  Setup Time 04/03/2013 05:02   Final   Colony Count 75,000 COLONIES/ML   Final   Culture     Final   Value: Multiple bacterial morphotypes present, none predominant. Suggest appropriate recollection if clinically indicated.   Report Status 04/04/2013 FINAL   Final    Medical History: Past Medical History  Diagnosis Date  . Allergic rhinitis   . 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 COPD (chronic obstructive pulmonary disease)   . Diverticula of colon   . Right leg DVT 01/2010  . IHD (ischemic heart disease)   . Hyperlipidemia   . Hypertension   . Depression   . Myalgia   . Hypothyroidism   . Bronchiectasis   . Spinal stenosis   . History of heartburn   . Joint pain   . 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 Coronary artery disease   . Pneumonia   . Arthritis     Medications:  Scheduled:  . acetaminophen  500 mg Oral BID  . [START ON 04/10/2013] aspirin EC  81 mg Oral q morning - 10a  . [START ON 04/10/2013] diltiazem  180  mg Oral q morning - 10a  . enoxaparin (LOVENOX) injection  40 mg Subcutaneous QHS  . famotidine  20 mg Oral QHS  . [START ON 04/10/2013] ferrous sulfate  325 mg Oral Q lunch  . guaiFENesin  1,200 mg Oral BID  . levofloxacin (LEVAQUIN) IV  750 mg Intravenous Q24H  . [START ON 04/10/2013] levothyroxine  50 mcg Oral QAC breakfast  . [START ON 04/10/2013] Memantine HCl ER  1 capsule Oral q morning - 10a  . mometasone-formoterol  2 puff Inhalation BID  . [START ON 04/10/2013] multivitamin-lutein  1 capsule Oral Q lunch  . tuberculin  5 Units Intradermal Once   Infusions:  . 0.9 % NaCl with KCl 20 mEq / L     Assessment:  79 yr old male with complaint of fever and weakness.  H/O COPD, MAI pneumonia.  IV Vancomycin per pharmacy and Levaquin per MD dosing started for suspected right upper lobe pneumonia  Goal of Therapy:  Vancomycin trough level 15-20 mcg/ml  Plan:  Measure antibiotic drug  levels at steady state Follow up culture results Vancomycin 1000mg  IV q24h *Would recommend adjusting Levaquin to 750mg  IV q48h as CrCl < 50 ml/min *  Elsy Chiang, Joselyn Glassman, PharmD 04/09/2013,11:14 PM

## 2013-04-09 NOTE — ED Notes (Signed)
Asked Dr. Rhunette Croft for the reason behind delay in getting pt a room upstairs. Pt is waiting for pt's PCP, Dr. Jarold Motto to come see pt and write admission orders. Consulting civil engineer, family and pt made aware.

## 2013-04-09 NOTE — ED Notes (Addendum)
Pt c/o of fever and catatonic state x1 week. States they have been going from dr to dr and no one can diagnose it. Pt usually walks by himself, has not been walking any this week. NAD at this time.

## 2013-04-09 NOTE — H&P (Signed)
[name] is an 79 y.o. male.   Chief Complaint: I feel weak and have been having persistent fevers HPI:  The patient is an 79 year old Caucasian man with several medical problems, including COPD with bronchiectasis, history of Mycobacterium avium intracellulare pneumonia, coronary artery disease, hypertension, and frailty. About a week ago he began to have intermittent fevers with progressive fatigue. He was seen in the emergency room on May 16 and was felt to have a nonacute presentation, so he was started on a Z-Pak. Subsequently he was seen in our office and Levaquin was added to his regimen. He has continued to have low-grade fevers at home that were not evident in the office and had CBC test in the office that did not show leukocytosis. He was seen earlier today in the office with malaise, intermittent altered mental status according to his wife, and intermittent fevers. In the office he did not have a fever and he was sent to the emergency room for further evaluation to include blood cultures and urine cultures. Workup in the emergency room showed a low-grade fever with worsening airspace disease in the right upper lobe, so he is being admitted for possible right upper lobe pneumonia. He has had a dry cough lately without significant shortness of breath. His appetite has been poor lately with little food and fluid intake. He has not had problems with nausea, vomiting, diarrhea, abdominal pain, dysuria, or frequency. In September 2013 he had an episode of pneumonia that left him quite weak so he required skilled nursing facility rehabilitation. At that time he had a PPD test placed that did not show evidence of tuberculosis.  Past Medical History  Diagnosis Date  . Allergic rhinitis   . 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 COPD (chronic obstructive pulmonary disease)   . Diverticula of colon   . Right leg DVT 01/2010  . IHD (ischemic heart disease)   . Hyperlipidemia   .  Hypertension   . Depression   . Myalgia   . Hypothyroidism   . Bronchiectasis   . Spinal stenosis   . History of heartburn   . Joint pain   . 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 Coronary artery disease   . Pneumonia   . Arthritis      (Not in a hospital admission)  ADDITIONAL HOME MEDICATIONS: See nursing notes  PHYSICIANS INVOLVED IN CARE: Geoffry Paradise (primary care), Sandrea Hughs (pulmonary medicine), Earney Hamburg (cardiology), Arlan Organ (hematology), Marlowe Kays (ortho), Claudette Head (GI)  Past Surgical History  Procedure Laterality Date  . Cardiac catheterization  06/13/2000    normal. ef 60%  . Cardiovascular stress test  04/2010    ef 54%, and normal perfusion  . Coronary angioplasty  1991    1st diagonal  . Carpal tunnel release      right  . Rotator cuff repair      left shoulder  . Knee surgery  August  2006    S/P right knee replacement  . Laminectomy      2005  . Cataract extraction      Bilateral     Family History  Problem Relation Age of Onset  . Heart disease Father   . Heart failure Father   . Breast cancer Maternal Aunt      Social History:  reports that he quit smoking about 60 years ago. His smoking use included Cigarettes. He has a 25 pack-year  smoking history. He has never used smokeless tobacco. He reports that he does not drink alcohol or use illicit drugs.  Allergies:  Allergies  Allergen Reactions  . Sulfonamide Derivatives Other (See Comments)    unknown  . Penicillins Rash     ROS: anemia, ankle swelling, arthritis, Heart disease, heart palpitation, high blood pressure, kidney disease and thyroid disease  PHYSICAL EXAM: Blood pressure 104/55, pulse 68, temperature 98.8 F (37.1 C), temperature source Oral, resp. rate 26, SpO2 97.00%. In general, the patient is a frail elderly white man who was somewhat pale and was in no apparent distress while lying at 30 elevation the head of bed.  HEENT exam was within normal limits, neck was supple without jugular venous distention or carotid bruit, chest had decreased breath sounds in the upper right thorax, heart had a slightly irregular rhythm without significant murmur or gallop, abdomen had normal bowel sounds and no tenderness, extremities have bilateral trace ankle edema with bilateral 1+ pedal pulses. Neurologic exam: He was alert and oriented x3 and able to move all extremities somewhat. Cranial nerves II through XII were normal. Cerebellar function and gait were not assessed.  Results for orders placed during the hospital encounter of 04/09/13 (from the past 48 hour(s))  CBC WITH DIFFERENTIAL     Status: Abnormal   Collection Time    04/09/13  4:52 PM      Result Value Range   WBC 7.4  4.0 - 10.5 K/uL   RBC 2.87 (*) 4.22 - 5.81 MIL/uL   Hemoglobin 9.8 (*) 13.0 - 17.0 g/dL   HCT 98.1 (*) 19.1 - 47.8 %   MCV 97.6  78.0 - 100.0 fL   MCH 34.1 (*) 26.0 - 34.0 pg   MCHC 35.0  30.0 - 36.0 g/dL   Comment: PRE-WARMING TECHNIQUE USED   RDW 14.8  11.5 - 15.5 %   Platelets 264  150 - 400 K/uL   Neutrophils Relative % 69  43 - 77 %   Neutro Abs 5.1  1.7 - 7.7 K/uL   Lymphocytes Relative 17  12 - 46 %   Lymphs Abs 1.3  0.7 - 4.0 K/uL   Monocytes Relative 12  3 - 12 %   Monocytes Absolute 0.9  0.1 - 1.0 K/uL   Eosinophils Relative 2  0 - 5 %   Eosinophils Absolute 0.1  0.0 - 0.7 K/uL   Basophils Relative 0  0 - 1 %   Basophils Absolute 0.0  0.0 - 0.1 K/uL  MAGNESIUM     Status: None   Collection Time    04/09/13  4:52 PM      Result Value Range   Magnesium 2.0  1.5 - 2.5 mg/dL  PHOSPHORUS     Status: None   Collection Time    04/09/13  4:52 PM      Result Value Range   Phosphorus 3.0  2.3 - 4.6 mg/dL  POCT I-STAT TROPONIN I     Status: None   Collection Time    04/09/13  4:56 PM      Result Value Range   Troponin i, poc 0.01  0.00 - 0.08 ng/mL   Comment 3            Comment: Due to the release kinetics of cTnI,     a  negative result within the first hours     of the onset of symptoms does not rule out     myocardial infarction with  certainty.     If myocardial infarction is still suspected,     repeat the test at appropriate intervals.  POCT I-STAT, CHEM 8     Status: Abnormal   Collection Time    04/09/13  4:58 PM      Result Value Range   Sodium 135  135 - 145 mEq/L   Potassium 3.9  3.5 - 5.1 mEq/L   Chloride 101  96 - 112 mEq/L   BUN 23  6 - 23 mg/dL   Creatinine, Ser 1.61 (*) 0.50 - 1.35 mg/dL   Glucose, Bld 096 (*) 70 - 99 mg/dL   Calcium, Ion 0.45 (*) 1.13 - 1.30 mmol/L   TCO2 25  0 - 100 mmol/L   Hemoglobin 9.5 (*) 13.0 - 17.0 g/dL   HCT 40.9 (*) 81.1 - 91.4 %  CG4 I-STAT (LACTIC ACID)     Status: None   Collection Time    04/09/13  4:59 PM      Result Value Range   Lactic Acid, Venous 1.47  0.5 - 2.2 mmol/L  BLOOD GAS, VENOUS     Status: Abnormal   Collection Time    04/09/13  5:00 PM      Result Value Range   pH, Ven 7.421 (*) 7.250 - 7.300   pCO2, Ven 38.1 (*) 45.0 - 50.0 mmHg   pO2, Ven 21.7 (*) 30.0 - 45.0 mmHg   Comment: CRITICAL RESULT CALLED TO, READ BACK BY AND VERIFIED WITH:      DR. NANAVATI, MD AT 1708 BY DANA BOYD, RRT, RCP ON 04/09/13   Bicarbonate 24.3 (*) 20.0 - 24.0 mEq/L   TCO2 21.3  0 - 100 mmol/L   Acid-Base Excess 0.6  0.0 - 2.0 mmol/L   O2 Saturation 33.2     Patient temperature 98.6     Drawn by [phone]     Sample type VENOUS    URINALYSIS, ROUTINE W REFLEX MICROSCOPIC     Status: Abnormal   Collection Time    04/09/13  5:34 PM      Result Value Range   Color, Urine YELLOW  YELLOW   APPearance CLEAR  CLEAR   Specific Gravity, Urine 1.015  1.005 - 1.030   pH 6.0  5.0 - 8.0   Glucose, UA NEGATIVE  NEGATIVE mg/dL   Hgb urine dipstick TRACE (*) NEGATIVE   Bilirubin Urine NEGATIVE  NEGATIVE   Ketones, ur NEGATIVE  NEGATIVE mg/dL   Protein, ur NEGATIVE  NEGATIVE mg/dL   Urobilinogen, UA 0.2  0.0 - 1.0 mg/dL   Nitrite NEGATIVE  NEGATIVE   Leukocytes, UA  TRACE (*) NEGATIVE  URINE MICROSCOPIC-ADD ON     Status: Abnormal   Collection Time    04/09/13  5:34 PM      Result Value Range   Squamous Epithelial / LPF FEW (*) RARE   WBC, UA 0-2  <3 WBC/hpf   RBC / HPF 0-2  <3 RBC/hpf   Bacteria, UA FEW (*) RARE   Urine-Other MUCOUS PRESENT     Dg Chest 2 View  04/09/2013   *RADIOLOGY REPORT*  Clinical Data: Fever  CHEST - 2 VIEW  Comparison: 04/02/2013  Findings: Interval worsening of the dense right upper lobe consolidation/fluid is noted.  Heart size is upper limits of normal.  Coarsened pulmonary parenchymal markings again noted. Area of probable scarring is noted at the right costophrenic angle. Upper thoracic spine kyphosis is present.  Left axillary clips are noted.  IMPRESSION:  Worsening than right upper lobe consolidation/fluid.   Original Report Authenticated By: Christiana Pellant, M.D.   Ct Head Wo Contrast  04/09/2013   *RADIOLOGY REPORT*  Clinical Data: 1-week history of fevers, found momentarily unresponsive earlier today.  CT HEAD WITHOUT CONTRAST  Technique:  Contiguous axial images were obtained from the base of the skull through the vertex without contrast.  Comparison: CT head 08/03/2012, 07/04/2012, 10/15/2011, 12/30/2006.  Findings: Moderate to severe cortical atrophy, mild deep atrophy, and moderate to severe changes of small vessel disease of the white matter diffusely, slightly progressive since 2008 but unchanged since last year.  Old lacunar stroke in the left basal ganglia, unchanged.  No mass lesion.  No midline shift.  No acute hemorrhage or hematoma.  No extra-axial fluid collections.  No evidence of acute infarction.  No skull fracture or other focal osseous abnormality involving the skull.  Mucous retention cyst or polyp in the left maxillary sinus. Remaining visualized paranasal sinuses, bilateral mastoid air cells, and bilateral middle ear cavities well-aerated.  Mild bilateral carotid siphon and right vertebral artery  atherosclerosis.  IMPRESSION:  1.  No acute intracranial abnormality. 2.  Generalized atrophy and chronic microvascular ischemic changes of the white matter, progressive since 2008 but unchanged since last year. 3.  Chronic left maxillary sinusitis.   Original Report Authenticated By: Hulan Saas, M.D.     Assessment/Plan #1 Fevers:  Low grade and most likely due to pneumonia in the right upper lobe where he has had a cavitary lesion in the past. His recent antibiotic treatment did not cover for possible MRSA infection, so we will add vancomycin intravenously. We will also obtain a CT scan of the chest with IV contrast. Other causes of fever could include atypical infection such as recurrent Mycobacterium avium intracellulare or neoplasm. 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 He shows no physical exam evidence of DVT which could also be a source of fever. We'll consider obtaining a pulmonary consultation depending on findings from the CT scan. #2 Atrial Fibrillation: Stable ventricular rate control on current medications. He is no longer on anticoagulation because of high fall risk.  #3 Coronary Artery Disease: Stable on current medications. #4 Anemia: Moderately severe and of uncertain etiology. We will check anemia workup laboratory studies.  #5 Gait Instability and Frailty: He appears to be very weak and we will have physical therapy and occupational therapy personnel and evaluate his capacities. He could require a brief skilled nursing facility rehabilitation.   Blessen Kimbrough G 04/09/2013, 9:24 PM

## 2013-04-09 NOTE — ED Provider Notes (Addendum)
History     CSN: [id number]  Arrival date & time 04/09/13  1617   First MD Initiated Contact with Patient 04/09/13 1619      Chief Complaint  Patient presents with  . Fever    (Consider location/radiation/quality/duration/timing/severity/associated sxs/prior treatment) HPI Comments: 79 yo WM with history of CAD, COPD, DVT, HTN, HLD comes in with cc of fevers x 1 week. Pt was seen about a week ago, started on AZT - he saw his PCP thereafter, and he was started on levoquin at that time. Wife reports that patient continues to have fevers and is getting weaker. Today, while they were getting ready to go to the clinic, patient was noted to be slitting slumped on the toilet, unresponsive (not unconscious), and wife had to help him get up, and dress. Patient doesn't recall this event. He looks unwell. States that he is feeling generally weak. Pt denies nausea, emesis, chest pains, shortness of breath, new cough, headaches, abdominal pain, uti like symptoms. No falls.    Patient is a 79 y.o. male presenting with fever. The history is provided by the patient and medical records.  Fever Associated symptoms: no chest pain, no chills, no confusion, no congestion, no cough, no dysuria, no headaches, no nausea, no rhinorrhea and no vomiting     Past Medical History  Diagnosis Date  . Allergic rhinitis   . 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 COPD (chronic obstructive pulmonary disease)   . Diverticula of colon   . Right leg DVT 01/2010  . IHD (ischemic heart disease)   . Hyperlipidemia   . Hypertension   . Depression   . Myalgia   . Hypothyroidism   . Bronchiectasis   . Spinal stenosis   . History of heartburn   . Joint pain   . 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 Coronary artery disease     Past Surgical History  Procedure Laterality Date  . Cardiac catheterization  06/13/2000    normal. ef 60%  . Cardiovascular stress test  04/2010    ef  54%, and normal perfusion  . Coronary angioplasty  1991    1st diagonal  . Carpal tunnel release      right  . Rotator cuff repair      left shoulder  . Knee surgery  August  2006    S/P right knee replacement  . Laminectomy      2005  . Cataract extraction      Bilateral     Family History  Problem Relation Age of Onset  . Heart disease Father   . Heart failure Father   . Breast cancer Maternal Aunt     History  Substance Use Topics  . Smoking status: Former Smoker -- 0.50 packs/day for 50 years    Types: Cigarettes    Quit date: 11/18/1952  . Smokeless tobacco: Never Used  . Alcohol Use: No      Review of Systems  Constitutional: Positive for fever, appetite change and fatigue. Negative for chills and activity change.  HENT: Negative for congestion, rhinorrhea, neck pain and neck stiffness.   Eyes: Negative for visual disturbance.  Respiratory: Negative for cough, chest tightness and shortness of breath.   Cardiovascular: Negative for chest pain.  Gastrointestinal: Negative for nausea, vomiting, abdominal pain and abdominal distention.  Genitourinary: Negative for dysuria, enuresis and difficulty urinating.  Musculoskeletal: Negative for arthralgias.  Neurological: Positive for weakness  and light-headedness. Negative for syncope and headaches.  Psychiatric/Behavioral: Negative for confusion.    Allergies  Sulfonamide derivatives and Penicillins  Home Medications   Current Outpatient Rx  Name  Route  Sig  Dispense  Refill  . acetaminophen (TYLENOL) 500 MG tablet   Oral   Take 500 mg by mouth 2 (two) times daily. pain         . aspirin EC 81 MG tablet   Oral   Take 81 mg by mouth every morning.         . diltiazem (DILACOR XR) 180 MG 24 hr capsule   Oral   Take 180 mg by mouth every morning.         . famotidine (PEPCID) 20 MG tablet   Oral   Take 20 mg by mouth at bedtime.         . ferrous sulfate 325 (65 FE) MG tablet   Oral   Take 325  mg by mouth daily with lunch.         . fluticasone (FLONASE) 50 MCG/ACT nasal spray   Nasal   Place 2 sprays into the nose 2 (two) times daily.         . Fluticasone-Salmeterol (ADVAIR DISKUS) 250-50 MCG/DOSE AEPB   Inhalation   Inhale 1 puff into the lungs every 12 (twelve) hours.         . furosemide (LASIX) 40 MG tablet   Oral   Take 40 mg by mouth every morning.         Marland Kitchen guaiFENesin (MUCINEX) 600 MG 12 hr tablet   Oral   Take 1,200 mg by mouth 2 (two) times daily.         Marland Kitchen levothyroxine (SYNTHROID, LEVOTHROID) 50 MCG tablet   Oral   Take 50 mcg by mouth every morning.          . Memantine HCl ER (NAMENDA XR) 28 MG CP24   Oral   Take 1 capsule by mouth every morning.         . Multiple Vitamins-Minerals (PRESERVISION AREDS) CAPS   Oral   Take 1 capsule by mouth daily with lunch.          . nitroGLYCERIN (NITROSTAT) 0.4 MG SL tablet   Sublingual   Place 0.4 mg under the tongue every 5 (five) minutes as needed. May repeat x3           BP 116/67  Pulse 68  Temp(Src) 99.4 F (37.4 C) (Oral)  Resp 21  SpO2 94%  Physical Exam  Nursing note and vitals reviewed. Constitutional: He is oriented to person, place, and time. He appears well-developed.  HENT:  Head: Normocephalic and atraumatic.  Eyes: Conjunctivae and EOM are normal. Pupils are equal, round, and reactive to light.  Neck: Normal range of motion. Neck supple. No JVD present.  Cardiovascular:  Murmur heard. Irregular irregular, tachycardia  Pulmonary/Chest: Effort normal and breath sounds normal.  Abdominal: Soft. Bowel sounds are normal. He exhibits no distension. There is no tenderness. There is no rebound and no guarding.  Musculoskeletal: He exhibits no edema and no tenderness.  Neurological: He is alert and oriented to person, place, and time.  Skin: Skin is warm. No rash noted.    ED Course  Procedures (including critical care time)  Labs Reviewed  URINE CULTURE  CBC WITH  DIFFERENTIAL  URINALYSIS, ROUTINE W REFLEX MICROSCOPIC  MAGNESIUM  PHOSPHORUS  BLOOD GAS, VENOUS   No results found.  No diagnosis found.    MDM   Date: 04/09/2013  Rate: 120  Rhythm: atrial fibrillation  QRS Axis: normal  Intervals: normal  ST/T Wave abnormalities: nonspecific ST/T changes  Conduction Disutrbances:none  Narrative Interpretation:   Old EKG Reviewed: changes noted  - mild St elevation v1, v2 and st depression laterally.  DDx: Sepsis syndrome ACS syndrome DKA ICH Stroke CHF exacerbation COPD exacerbation Infection - pneumonia/UTI/Cellulitis PE Dehydration Electrolyte abnormality Tox syndrome Paraneoplastic disease / cancer  Pt comes in with cc of persistent fevers x 1 week, increased weakness, and an episode of unresponsiveness.  Exam is non focal - except for fever and mild tachycardia. Unsure what the cause of fever is. Will do a basic infection screen - to ensure there is no PNA or UTI. Will also get EKG and ACS labs.  I am not sure what the unresponsive episode was all about. TIA? ,encephalopathy?  Will get Ct head.      Derwood Kaplan, MD 04/09/13 1701  6:50 PM I have reviewed all the labs and imaging. CXR - worsening consolidation. CT is negative. Lactate is WNL. His HR < 100 at this time. Spoke with Dr. Jarold Motto, he will come and see the patient, and likely admit. I will allow the admitting team to decide on the antibiotics choice.  Derwood Kaplan, MD 04/09/13 [phone]

## 2013-04-10 ENCOUNTER — Encounter (HOSPITAL_COMMUNITY): Payer: Self-pay | Admitting: *Deleted

## 2013-04-10 ENCOUNTER — Inpatient Hospital Stay (HOSPITAL_COMMUNITY): Payer: Medicare Other

## 2013-04-10 LAB — CBC
HCT: 22.8 % — ABNORMAL LOW (ref 39.0–52.0)
Hemoglobin: 8.1 g/dL — ABNORMAL LOW (ref 13.0–17.0)
RBC: 2.23 MIL/uL — ABNORMAL LOW (ref 4.22–5.81)
WBC: 5.5 10*3/uL (ref 4.0–10.5)

## 2013-04-10 LAB — COMPREHENSIVE METABOLIC PANEL WITH GFR
ALT: 6 U/L (ref 0–53)
AST: 12 U/L (ref 0–37)
Albumin: 2.2 g/dL — ABNORMAL LOW (ref 3.5–5.2)
Alkaline Phosphatase: 49 U/L (ref 39–117)
BUN: 16 mg/dL (ref 6–23)
CO2: 24 meq/L (ref 19–32)
Calcium: 8.1 mg/dL — ABNORMAL LOW (ref 8.4–10.5)
Chloride: 101 meq/L (ref 96–112)
Creatinine, Ser: 1.17 mg/dL (ref 0.50–1.35)
GFR calc Af Amer: 63 mL/min — ABNORMAL LOW
GFR calc non Af Amer: 55 mL/min — ABNORMAL LOW
Glucose, Bld: 103 mg/dL — ABNORMAL HIGH (ref 70–99)
Potassium: 3.4 meq/L — ABNORMAL LOW (ref 3.5–5.1)
Sodium: 135 meq/L (ref 135–145)
Total Bilirubin: 0.3 mg/dL (ref 0.3–1.2)
Total Protein: 6.2 g/dL (ref 6.0–8.3)

## 2013-04-10 LAB — IRON AND TIBC
Saturation Ratios: 20 % (ref 20–55)
TIBC: 186 ug/dL — ABNORMAL LOW (ref 215–435)

## 2013-04-10 LAB — RETICULOCYTES: Retic Ct Pct: 1.1 % (ref 0.4–3.1)

## 2013-04-10 LAB — VITAMIN B12: Vitamin B-12: 496 pg/mL (ref 211–911)

## 2013-04-10 MED ORDER — CLINDAMYCIN PHOSPHATE 600 MG/50ML IV SOLN
600.0000 mg | Freq: Three times a day (TID) | INTRAVENOUS | Status: DC
  Administered 2013-04-10 – 2013-04-13 (×10): 600 mg via INTRAVENOUS
  Filled 2013-04-10 (×11): qty 50

## 2013-04-10 MED ORDER — ALBUTEROL SULFATE (5 MG/ML) 0.5% IN NEBU
2.5000 mg | INHALATION_SOLUTION | Freq: Four times a day (QID) | RESPIRATORY_TRACT | Status: DC
  Administered 2013-04-10 – 2013-04-14 (×16): 2.5 mg via RESPIRATORY_TRACT
  Filled 2013-04-10 (×18): qty 0.5

## 2013-04-10 MED ORDER — LEVOFLOXACIN IN D5W 750 MG/150ML IV SOLN
750.0000 mg | INTRAVENOUS | Status: DC
  Administered 2013-04-11: 750 mg via INTRAVENOUS
  Filled 2013-04-10: qty 150

## 2013-04-10 MED ORDER — IOHEXOL 300 MG/ML  SOLN
100.0000 mL | Freq: Once | INTRAMUSCULAR | Status: AC | PRN
  Administered 2013-04-10: 100 mL via INTRAVENOUS

## 2013-04-10 NOTE — Evaluation (Signed)
Physical Therapy Evaluation Patient Details Name: [name] MRN: [id number] DOB: 08/09/1924 Today's Date: 04/10/2013 Time: 1440-1511 PT Time Calculation (min): 31 min  PT Assessment / Plan / Recommendation Clinical Impression  Pt admitted with dx of PNA and presents with functional limitations 2* mild ambulatory balance deficits and decreased endurance.  Pt will benefit from PT intervention to address current deficits and maximize IND for return home.    PT Assessment  Patient needs continued PT services    Follow Up Recommendations  Home health PT    Does the patient have the potential to tolerate intense rehabilitation      Barriers to Discharge None      Equipment Recommendations  None recommended by PT    Recommendations for Other Services OT consult   Frequency 7X/week    Precautions / Restrictions Precautions Precautions: Fall Restrictions Weight Bearing Restrictions: No   Pertinent Vitals/Pain Pt denies pain      Mobility  Bed Mobility Bed Mobility: Supine to Sit Supine to Sit: HOB elevated;5: Supervision Details for Bed Mobility Assistance: increased time Transfers Transfers: Sit to Stand;Stand to Sit Sit to Stand: 4: Min assist Stand to Sit: 4: Min assist Details for Transfer Assistance: cues for use of UEs  Ambulation/Gait Ambulation/Gait Assistance: 4: Min assist Ambulation Distance (Feet): 500 Feet Assistive device: Rolling walker Ambulation/Gait Assistance Details: cues for position from RW and pacing Gait Pattern: Step-through pattern;Trunk flexed    Exercises     PT Diagnosis: Difficulty walking  PT Problem List: Decreased activity tolerance;Decreased balance;Decreased mobility;Decreased knowledge of use of DME PT Treatment Interventions: DME instruction;Gait training;Therapeutic activities;Therapeutic exercise;Patient/family education;Functional mobility training   PT Goals Acute Rehab PT Goals PT Goal Formulation: With patient Time  For Goal Achievement: 04/24/13 Potential to Achieve Goals: Good Pt will go Supine/Side to Sit: with modified independence PT Goal: Supine/Side to Sit - Progress: Goal set today Pt will go Sit to Supine/Side: with modified independence PT Goal: Sit to Supine/Side - Progress: Goal set today Pt will go Sit to Stand: with modified independence PT Goal: Sit to Stand - Progress: Goal set today Pt will go Stand to Sit: with modified independence PT Goal: Stand to Sit - Progress: Goal set today Pt will Ambulate: >150 feet;with supervision;with rolling walker PT Goal: Ambulate - Progress: Goal set today  Visit Information  Last PT Received On: 04/10/13 Assistance Needed: +1    Subjective Data  Subjective: It feels good to be up on my feet Patient Stated Goal: Home   Prior Functioning  Home Living Lives With: Spouse Available Help at Discharge: Family Type of Home: Apartment Home Access: Level entry;Elevator Home Layout: One level Home Adaptive Equipment: Environmental consultant - four wheeled Additional Comments: Pt is resident at [facility] Prior Function Level of Independence: Independent with assistive device(s) Vocation: Retired Musician: No difficulties    Copywriter, advertising Arousal/Alertness: Awake/alert Behavior During Therapy: WFL for tasks assessed/performed Overall Cognitive Status: Within Functional Limits for tasks assessed    Extremity/Trunk Assessment Right Upper Extremity Assessment RUE ROM/Strength/Tone: [facility] for tasks assessed Left Upper Extremity Assessment LUE ROM/Strength/Tone: WFL for tasks assessed Right Lower Extremity Assessment RLE ROM/Strength/Tone: WFL for tasks assessed Left Lower Extremity Assessment LLE ROM/Strength/Tone: WFL for tasks assessed Trunk Assessment Trunk Assessment: Kyphotic   Balance Balance Balance Assessed: Yes Static Sitting Balance Static Sitting - Balance Support: No upper extremity supported Static Sitting - Level  of Assistance: 5: Stand by assistance Static Sitting - Comment/# of Minutes: 2 Static Standing  Balance Static Standing - Balance Support: Bilateral upper extremity supported Static Standing - Level of Assistance: 4: Min assist Static Standing - Comment/# of Minutes: 2  End of Session PT - End of Session Equipment Utilized During Treatment: Gait belt Activity Tolerance: Patient tolerated treatment well Patient left: in chair;with call bell/phone within reach;with family/visitor present Nurse Communication: Mobility status  GP     [name] 04/10/2013, 4:42 PM

## 2013-04-10 NOTE — Progress Notes (Signed)
Subjective: Had some chills overnight otherwise fine with no dyspnea or productive cough.  Objective: Vital signs in last 24 hours: Temp:  [98.3 F (36.8 C)-99.4 F (37.4 C)] 98.3 F (36.8 C) (05/24 0500) Pulse Rate:  [62-78] 62 (05/24 0500) Resp:  [17-27] 18 (05/24 0500) BP: (98-116)/(52-67) 111/61 mmHg (05/24 0500) SpO2:  [94 %-97 %] 97 % (05/24 0500) Weight:  [79 kg (174 lb 2.6 oz)] 79 kg (174 lb 2.6 oz) (05/23 2155) Weight change:    Intake/Output from previous day: 05/23 0701 - 05/24 0700 In: 395 [I.V.:395] Out: 1150 [Urine:1150]   General appearance: alert, cooperative, no distress and with pale complexion Resp: clear to auscultation bilaterally Cardio: regular rate and rhythm, S1, S2 normal, no murmur, click, rub or gallop and regular rate and rhythm GI: soft, non-tender; bowel sounds normal; no masses,  no organomegaly Extremities: extremities normal, atraumatic, no cyanosis or edema  Lab Results:  Recent Labs  04/09/13 1652 04/09/13 1658 04/10/13 0440  WBC 7.4  --  5.5  HGB 9.8* 9.5* 8.1*  HCT 28.0* 28.0* 22.8*  PLT 264  --  231   BMET  Recent Labs  04/09/13 1658 04/10/13 0440  NA 135 135  K 3.9 3.4*  CL 101 101  CO2  --  24  GLUCOSE 102* 103*  BUN 23 16  CREATININE 1.50* 1.17  CALCIUM  --  8.1*   CMET CMP     Component Value Date/Time   NA 135 04/10/2013 0440   K 3.4* 04/10/2013 0440   CL 101 04/10/2013 0440   CO2 24 04/10/2013 0440   GLUCOSE 103* 04/10/2013 0440   BUN 16 04/10/2013 0440   CREATININE 1.17 04/10/2013 0440   CALCIUM 8.1* 04/10/2013 0440   PROT 6.2 04/10/2013 0440   ALBUMIN 2.2* 04/10/2013 0440   AST 12 04/10/2013 0440   ALT 6 04/10/2013 0440   ALKPHOS 49 04/10/2013 0440   BILITOT 0.3 04/10/2013 0440   GFRNONAA 55* 04/10/2013 0440   GFRAA 63* 04/10/2013 0440    CBG (last 3)  No results found for this basename: GLUCAP,  in the last 72 hours  INR RESULTS:   Lab Results  Component Value Date   INR 1.10 07/04/2012   INR 2.19*  05/03/2010   INR 2.29* 05/02/2010     Studies/Results: Dg Chest 2 View  04/09/2013   *RADIOLOGY REPORT*  Clinical Data: Fever  CHEST - 2 VIEW  Comparison: 04/02/2013  Findings: Interval worsening of the dense right upper lobe consolidation/fluid is noted.  Heart size is upper limits of normal.  Coarsened pulmonary parenchymal markings again noted. Area of probable scarring is noted at the right costophrenic angle. Upper thoracic spine kyphosis is present.  Left axillary clips are noted.  IMPRESSION: Worsening than right upper lobe consolidation/fluid.   Original Report Authenticated By: Christiana Pellant, M.D.   Ct Head Wo Contrast  04/09/2013   *RADIOLOGY REPORT*  Clinical Data: 1-week history of fevers, found momentarily unresponsive earlier today.  CT HEAD WITHOUT CONTRAST  Technique:  Contiguous axial images were obtained from the base of the skull through the vertex without contrast.  Comparison: CT head 08/03/2012, 07/04/2012, 10/15/2011, 12/30/2006.  Findings: Moderate to severe cortical atrophy, mild deep atrophy, and moderate to severe changes of small vessel disease of the white matter diffusely, slightly progressive since 2008 but unchanged since last year.  Old lacunar stroke in the left basal ganglia, unchanged.  No mass lesion.  No midline shift.  No acute hemorrhage or  hematoma.  No extra-axial fluid collections.  No evidence of acute infarction.  No skull fracture or other focal osseous abnormality involving the skull.  Mucous retention cyst or polyp in the left maxillary sinus. Remaining visualized paranasal sinuses, bilateral mastoid air cells, and bilateral middle ear cavities well-aerated.  Mild bilateral carotid siphon and right vertebral artery atherosclerosis.  IMPRESSION:  1.  No acute intracranial abnormality. 2.  Generalized atrophy and chronic microvascular ischemic changes of the white matter, progressive since 2008 but unchanged since last year. 3.  Chronic left maxillary sinusitis.    Original Report Authenticated By: Hulan Saas, M.D.    Medications: I have reviewed the patient's current medications.  Assessment/Plan: #1 Pneumonia: stable on meds. He has worsening RUL airspace disease versus sept 2013. This could be from pneumonia versus neoplasm. Will check CT chest today, consider pulmonary specialist evaluation.  #2 Anemia: moderately severe and worse due to re-hydration. Labs are pending and will add hemoccult testing. Will recheck CBC tomorrow.  #3 Frailty: PT evaluation of gait.   LOS: 1 day   [name] 04/10/2013, 8:03 AM

## 2013-04-11 LAB — URINE CULTURE

## 2013-04-11 LAB — CBC
HCT: 25 % — ABNORMAL LOW (ref 39.0–52.0)
Hemoglobin: 8.4 g/dL — ABNORMAL LOW (ref 13.0–17.0)
MCH: 33.7 pg (ref 26.0–34.0)
MCHC: 33.6 g/dL (ref 30.0–36.0)

## 2013-04-11 LAB — BASIC METABOLIC PANEL
BUN: 12 mg/dL (ref 6–23)
Calcium: 8.4 mg/dL (ref 8.4–10.5)
Creatinine, Ser: 1.13 mg/dL (ref 0.50–1.35)
GFR calc non Af Amer: 57 mL/min — ABNORMAL LOW (ref >90)
Glucose, Bld: 94 mg/dL (ref 70–99)

## 2013-04-11 MED ORDER — ENSURE PUDDING PO PUDG
1.0000 | Freq: Three times a day (TID) | ORAL | Status: DC
  Administered 2013-04-11 – 2013-04-14 (×7): 1 via ORAL
  Filled 2013-04-11 (×12): qty 1

## 2013-04-11 MED ORDER — LEVOFLOXACIN IN D5W 750 MG/150ML IV SOLN
750.0000 mg | Freq: Every day | INTRAVENOUS | Status: DC
  Administered 2013-04-11 – 2013-04-13 (×3): 750 mg via INTRAVENOUS
  Filled 2013-04-11 (×3): qty 150

## 2013-04-11 MED ORDER — SACCHAROMYCES BOULARDII 250 MG PO CAPS
250.0000 mg | ORAL_CAPSULE | Freq: Two times a day (BID) | ORAL | Status: DC
  Administered 2013-04-11 – 2013-04-14 (×7): 250 mg via ORAL
  Filled 2013-04-11 (×8): qty 1

## 2013-04-11 MED ORDER — POLYSACCHARIDE IRON COMPLEX 150 MG PO CAPS
150.0000 mg | ORAL_CAPSULE | Freq: Every day | ORAL | Status: DC
  Administered 2013-04-11 – 2013-04-14 (×4): 150 mg via ORAL
  Filled 2013-04-11 (×4): qty 1

## 2013-04-11 NOTE — Progress Notes (Signed)
Patient has had variable heart rate and irregular heartbeat during the day today. At this time, heartrate is 74 and regular, but BP is decreased to 90/50, manually, in both right and left arms. This RN paged Dr. Evlyn Kanner to ask for parameters for this gentleman during the night, please call if SBP <80 tonight.

## 2013-04-11 NOTE — Progress Notes (Signed)
Subjective: Not having a productive cough or dyspnea. Antibiotics broadened due to CT chest findings with lung abscess. He is not having diarrhea and is eating well.   Objective: Vital signs in last 24 hours: Temp:  [98.4 F (36.9 C)-99.9 F (37.7 C)] 98.4 F (36.9 C) (05/25 0500) Pulse Rate:  [67-69] 67 (05/25 0500) Resp:  [16-20] 16 (05/25 0500) BP: (114-127)/(62) 125/62 mmHg (05/25 0500) SpO2:  [96 %-100 %] 98 % (05/25 0500) Weight change:    Intake/Output from previous day: 05/24 0701 - 05/25 0700 In: 1771.7 [P.O.:240; I.V.:1031.7; IV Piggyback:500] Out: 900 [Urine:900]   General appearance: alert, cooperative and no distress Resp: clear to auscultation bilaterally Cardio: regular rate and rhythm GI: soft, non-tender; bowel sounds normal; no masses,  no organomegaly Extremities: extremities normal, atraumatic, no cyanosis or edema  Lab Results:  Recent Labs  04/10/13 0440 04/11/13 0449  WBC 5.5 5.9  HGB 8.1* 8.4*  HCT 22.8* 25.0*  PLT 231 269   BMET  Recent Labs  04/10/13 0440 04/11/13 0449  NA 135 132*  K 3.4* 3.8  CL 101 101  CO2 24 23  GLUCOSE 103* 94  BUN 16 12  CREATININE 1.17 1.13  CALCIUM 8.1* 8.4   CMET CMP     Component Value Date/Time   NA 132* 04/11/2013 0449   K 3.8 04/11/2013 0449   CL 101 04/11/2013 0449   CO2 23 04/11/2013 0449   GLUCOSE 94 04/11/2013 0449   BUN 12 04/11/2013 0449   CREATININE 1.13 04/11/2013 0449   CALCIUM 8.4 04/11/2013 0449   PROT 6.2 04/10/2013 0440   ALBUMIN 2.2* 04/10/2013 0440   AST 12 04/10/2013 0440   ALT 6 04/10/2013 0440   ALKPHOS 49 04/10/2013 0440   BILITOT 0.3 04/10/2013 0440   GFRNONAA 57* 04/11/2013 0449   GFRAA 66* 04/11/2013 0449    CBG (last 3)  No results found for this basename: GLUCAP,  in the last 72 hours  INR RESULTS:   Lab Results  Component Value Date   INR 1.10 07/04/2012   INR 2.19* 05/03/2010   INR 2.29* 05/02/2010     Studies/Results: Dg Chest 2 View  04/09/2013   *RADIOLOGY  REPORT*  Clinical Data: Fever  CHEST - 2 VIEW  Comparison: 04/02/2013  Findings: Interval worsening of the dense right upper lobe consolidation/fluid is noted.  Heart size is upper limits of normal.  Coarsened pulmonary parenchymal markings again noted. Area of probable scarring is noted at the right costophrenic angle. Upper thoracic spine kyphosis is present.  Left axillary clips are noted.  IMPRESSION: Worsening than right upper lobe consolidation/fluid.   Original Report Authenticated By: Christiana Pellant, M.D.   Ct Head Wo Contrast  04/09/2013   *RADIOLOGY REPORT*  Clinical Data: 1-week history of fevers, found momentarily unresponsive earlier today.  CT HEAD WITHOUT CONTRAST  Technique:  Contiguous axial images were obtained from the base of the skull through the vertex without contrast.  Comparison: CT head 08/03/2012, 07/04/2012, 10/15/2011, 12/30/2006.  Findings: Moderate to severe cortical atrophy, mild deep atrophy, and moderate to severe changes of small vessel disease of the white matter diffusely, slightly progressive since 2008 but unchanged since last year.  Old lacunar stroke in the left basal ganglia, unchanged.  No mass lesion.  No midline shift.  No acute hemorrhage or hematoma.  No extra-axial fluid collections.  No evidence of acute infarction.  No skull fracture or other focal osseous abnormality involving the skull.  Mucous retention cyst or  polyp in the left maxillary sinus. Remaining visualized paranasal sinuses, bilateral mastoid air cells, and bilateral middle ear cavities well-aerated.  Mild bilateral carotid siphon and right vertebral artery atherosclerosis.  IMPRESSION:  1.  No acute intracranial abnormality. 2.  Generalized atrophy and chronic microvascular ischemic changes of the white matter, progressive since 2008 but unchanged since last year. 3.  Chronic left maxillary sinusitis.   Original Report Authenticated By: Hulan Saas, M.D.   Ct Chest W Contrast  04/10/2013    *RADIOLOGY REPORT*  Clinical Data: Recent abnormal chest imaging with right upper lobe cavitary opacity.  CT CHEST WITH CONTRAST  Technique:  Multidetector CT imaging of the chest was performed following the standard protocol during bolus administration of intravenous contrast.  Contrast: OMNIPAQUE IOHEXOL 300 MG/ML IV.  Comparison: Two-view chest x-ray 04/09/2013, 04/02/2013, 08/10/2012.  CT chest 10/05/2007.  Findings: Airspace consolidation with air bronchograms in the right apex.  Necrosis with cavitation in the right upper lobe parenchyma, with air-fluid levels.  Parenchymal calcifications in the area of consolidation and necrosis, consistent with calcified granulomata. Small amount of pleural fluid immediately adjacent to the parenchymal necrosis, without evidence of pleural enhancement to suggest empyema.  Patchy airspace opacities in the left upper lobe, inferior right upper lobe, and both lower lobes.  Calcified granuloma laterally in the right lower lobe.  Chronic pleural thickening bilaterally. Small right pleural effusion dependently.  No definite left pleural effusion.  Heart enlarged with left ventricular predominance.  Severe LAD and left circumflex coronary atherosclerosis.  No pericardial effusion. Moderate atherosclerosis involving the thoracic and upper abdominal aorta without aneurysm or dissection.  Scattered upper normal size to slightly enlarged mediastinal and right hilar lymph nodes, the largest in the right hilum measuring approximately 2.4 x 1.6 cm and in the subcarinal region of the mediastinum (station 7) measuring 1.3 x 1.9 cm.  No nodal masses. Visualized thyroid gland atrophic.  Air-fluid level in the esophagus without evidence of hiatal hernia. Numerous calcified granulomata in the spleen.  Calcified granuloma in the anterior segment right lobe of visualized liver.  Visualized upper abdomen otherwise unremarkable.  Probable sebaceous cyst in the subcutaneous fat of the right  chest wall, not visualized on the prior CT.  Bone window images demonstrate exaggeration of the usual thoracic kyphosis, generalized osseous demineralization, and degenerative changes involving the lower cervical spine and throughout the thoracic spine; spinal stenosis is suspected at the C6-7 level due to a chronic disc herniation with calcification in the posterior annular fibers.  IMPRESSION:  1.  Pneumonia involving the right upper lobe with associated lung abscess, accounting for the cavitary lesion on recent chest imaging.  Patchy pneumonia is also present elsewhere throughout both lungs. 2.  Reactive right hilar and subcarinal mediastinal lymphadenopathy. 3.  Mild cardiomegaly without evidence of pulmonary edema. 4.  Small right pleural effusion.  There is also a small amount of pleural fluid in the right upper thorax adjacent to the lung abscess, but there is no pleural enhancement to confirm empyema. 5.  Old granulomatous disease. 6.  Air-fluid level in the esophagus without evidence of hiatal hernia, GE reflux and/or esophageal dysmotility. 7.  Probable sebaceous cyst in the right chest wall. 8.  Spinal stenosis suspected at the C6-7 level due to a chronic disc herniation with calcification in the posterior annular fibers.   Original Report Authenticated By: Hulan Saas, M.D.    Medications: I have reviewed the patient's current medications.  Assessment/Plan: #1 Pneumonia with lung abscess: stable on  broad spectrum antibiotics. If condition worsens then will request formal pulmonary specialist consultation. #2 Anemia: moderately severe and stable. Labs show borderline iron deficiency with somewhat low reticulocyte count. Will add iron supplement. #3 Protein calorie malnutrition: moderate and doing well with assistance with meals .   LOS: 2 days   [name] G 04/11/2013, 8:34 AM

## 2013-04-12 ENCOUNTER — Inpatient Hospital Stay (HOSPITAL_COMMUNITY): Payer: Medicare Other

## 2013-04-12 MED ORDER — POLYETHYLENE GLYCOL 3350 17 G PO PACK
17.0000 g | PACK | Freq: Every day | ORAL | Status: DC
  Administered 2013-04-12 – 2013-04-14 (×3): 17 g via ORAL
  Filled 2013-04-12 (×3): qty 1

## 2013-04-12 MED ORDER — POLYETHYLENE GLYCOL 3350 17 G PO PACK
17.0000 g | PACK | Freq: Every day | ORAL | Status: DC
  Filled 2013-04-12: qty 1

## 2013-04-12 NOTE — Progress Notes (Signed)
ANTIBIOTIC CONSULT NOTE - Follow up  Pharmacy Consult for Vancomycin Indication: Suspected Pneumonia  Allergies  Allergen Reactions  . Sulfonamide Derivatives Other (See Comments)    unknown  . Penicillins Rash    Patient Measurements: Height: 6' (182.9 cm) Weight: 174 lb 2.6 oz (79 kg) IBW/kg (Calculated) : 77.6  Vital Signs: Temp: 98.7 F (37.1 C) (05/26 0500) Temp src: Tympanic (05/26 0500) BP: 118/71 mmHg (05/26 0500) Pulse Rate: 66 (05/26 0500)  Labs:  Recent Labs  04/09/13 1652 04/09/13 1658 04/10/13 0440 04/11/13 0449  WBC 7.4  --  5.5 5.9  HGB 9.8* 9.5* 8.1* 8.4*  PLT 264  --  231 269  CREATININE  --  1.50* 1.17 1.13   Estimated Creatinine Clearance: 51.5 ml/min (by C-G formula based on Cr of 1.13).  Medications:  Anti-infectives   Start     Dose/Rate Route Frequency Ordered Stop   04/11/13 1400  levofloxacin (LEVAQUIN) IVPB 750 mg     750 mg 100 mL/hr over 90 Minutes Intravenous Daily 04/11/13 1151     04/11/13 0600  levofloxacin (LEVAQUIN) IVPB 750 mg  Status:  Discontinued     750 mg 100 mL/hr over 90 Minutes Intravenous Every 48 hours 04/10/13 0810 04/11/13 1151   04/10/13 1400  clindamycin (CLEOCIN) IVPB 600 mg     600 mg 100 mL/hr over 30 Minutes Intravenous 3 times per day 04/10/13 1029     04/09/13 2359  vancomycin (VANCOCIN) IVPB 1000 mg/200 mL premix     1,000 mg 200 mL/hr over 60 Minutes Intravenous Every 24 hours 04/09/13 2319     04/09/13 2330  levofloxacin (LEVAQUIN) IVPB 750 mg  Status:  Discontinued     750 mg 100 mL/hr over 90 Minutes Intravenous Every 24 hours 04/09/13 2300 04/10/13 0810      Assessment:  79 yr old male with complaint of fever and weakness.  H/O COPD, MAI pneumonia.  Pharmacy asked to assist with IV Vancomycin dosing for pneumonia with lung abscess.  Day #3 Vancomycin, Clindamycin, Levaquin  SCr is improved/stable with CrCl ~ 51 ml/min (46 ml/min normalized)  Goal of Therapy:  Vancomycin trough level 15-20  mcg/ml  Plan:   Vancomycin 1g IV q24h.  Measure Vanc trough at steady state.  Follow up renal fxn and culture results.  Lynann Beaver PharmD, BCPS Pager [phone] 04/12/2013 2:03 PM

## 2013-04-12 NOTE — Progress Notes (Signed)
Physical Therapy Treatment Patient Details Name: [name] MRN: [id number] DOB: March 09, 1924 Today's Date: 04/12/2013 Time: 3086-5784 PT Time Calculation (min): 23 min  PT Assessment / Plan / Recommendation Comments on Treatment Session       Follow Up Recommendations  Home health PT     Does the patient have the potential to tolerate intense rehabilitation     Barriers to Discharge        Equipment Recommendations  None recommended by PT    Recommendations for Other Services OT consult  Frequency Min 3X/week   Plan Discharge plan remains appropriate;Frequency needs to be updated    Precautions / Restrictions Precautions Precautions: Fall Restrictions Weight Bearing Restrictions: No   Pertinent Vitals/Pain No c/o pain    Mobility  Bed Mobility Bed Mobility: Supine to Sit Supine to Sit: 4: Min guard Details for Bed Mobility Assistance: increased time Transfers Transfers: Sit to Stand;Stand to Sit Sit to Stand: From bed Stand to Sit: 4: Min assist;To chair/3-in-1 Details for Transfer Assistance: Assist to rise, stabilize, control descent. VCs safety, hand placement Ambulation/Gait Ambulation/Gait Assistance: 4: Min guard;4: Min Environmental consultant (Feet): 500 Feet Assistive device: Rolling walker Ambulation/Gait Assistance Details: VCS distance from RW, posture. Varying gait speed and level of assist throughout.  Gait Pattern: Step-through pattern;Trunk flexed;Decreased stride length    Exercises     PT Diagnosis:    PT Problem List:   PT Treatment Interventions:     PT Goals Acute Rehab PT Goals Pt will go Supine/Side to Sit: with modified independence PT Goal: Supine/Side to Sit - Progress: Progressing toward goal Pt will go Sit to Stand: with modified independence PT Goal: Sit to Stand - Progress: Progressing toward goal Pt will go Stand to Sit: with modified independence PT Goal: Stand to Sit - Progress: Progressing toward goal Pt will  Ambulate: >150 feet;with supervision;with rolling walker PT Goal: Ambulate - Progress: Progressing toward goal  Visit Information  Last PT Received On: 04/12/13 Assistance Needed: +1    Subjective Data  Subjective: thanks for coming by Patient Stated Goal: Home   Cognition  Cognition Arousal/Alertness: Awake/alert Behavior During Therapy: WFL for tasks assessed/performed Overall Cognitive Status: Within Functional Limits for tasks assessed    Balance     End of Session PT - End of Session Activity Tolerance: Patient tolerated treatment well Patient left: in chair;with call bell/phone within reach;with family/visitor present   GP     Rebeca Alert, MPT Pager: [phone]

## 2013-04-12 NOTE — Progress Notes (Signed)
Subjective: Had a good night. Some nonproductive coughing. No pleuritic CP. Some constipation. Eating pretty well. No other pains. No coughing while eating   Objective: Vital signs in last 24 hours: Temp:  [98.2 F (36.8 C)-99.3 F (37.4 C)] 98.7 F (37.1 C) (05/26 0500) Pulse Rate:  [66-107] 66 (05/26 0500) Resp:  [18-20] 18 (05/26 0500) BP: (91-118)/(48-71) 118/71 mmHg (05/26 0500) SpO2:  [93 %-98 %] 93 % (05/26 0835)  Intake/Output from previous day: 05/25 0701 - 05/26 0700 In: 1227.5 [P.O.:240; I.V.:637.5; IV Piggyback:350] Out: 400 [Urine:400] Intake/Output this shift: Total I/O In: -  Out: 200 [Urine:200]  General: no distress white male sitting up in no distress. He is slightly pale. Oral mucous members are moist. Lungs revealed diminished breath sounds with occasional rhonchi. Heart is regular and distant. Abdomen is slightly distended with normoactive bowel sounds. Extremities are thin without edema. He is awake and alert and mentating well.   Lab Results   Recent Labs  04/10/13 0440 04/11/13 0449  WBC 5.5 5.9  RBC 2.23* 2.49*  HGB 8.1* 8.4*  HCT 22.8* 25.0*  MCV 102.2* 100.4*  MCH 36.3* 33.7  RDW 15.1 14.9  PLT 231 269    Recent Labs  04/10/13 0440 04/11/13 0449  NA 135 132*  K 3.4* 3.8  CL 101 101  CO2 24 23  GLUCOSE 103* 94  BUN 16 12  CREATININE 1.17 1.13  CALCIUM 8.1* 8.4  Results for DONNAVIN, VANDENBRINK (MRN [id number]) as of 04/12/2013 09:56  Ref. Range 04/09/2013 16:52 04/09/2013 16:58 04/10/2013 04:40 04/10/2013 05:49 04/11/2013 04:49  WBC Latest Range: 4.0-10.5 K/uL 7.4  5.5  5.9  RBC Latest Range: 4.22-5.81 MIL/uL 2.87 (L)  2.23 (L)  2.49 (L)  Hemoglobin Latest Range: 13.0-17.0 g/dL 9.8 (L) 9.5 (L) 8.1 (L)  8.4 (L)  HCT Latest Range: 39.0-52.0 % 28.0 (L) 28.0 (L) 22.8 (L)  25.0 (L)  MCV Latest Range: 78.0-100.0 fL 97.6  102.2 (H)  100.4 (H)  MCH Latest Range: 26.0-34.0 pg 34.1 (H)  36.3 (H)  33.7  MCHC Latest Range: 30.0-36.0 g/dL 78.2  95.6   21.3  RDW Latest Range: 11.5-15.5 % 14.8  15.1  14.9  Platelets Latest Range: 150-400 K/uL 264  231  269   Results for DAMIER, DISANO (MRN [id number]) as of 04/12/2013 09:56  Ref. Range 04/10/2013 05:49  RBC. Latest Range: 4.22-5.81 MIL/uL 2.64 (L)  Retic Ct Pct Latest Range: 0.4-3.1 % 1.1  Retic Count, Manual Latest Range: 19.0-186.0 K/uL 29.0   CT CHEST 1. Pneumonia involving the right upper lobe with associated lung  abscess, accounting for the cavitary lesion on recent chest  imaging. Patchy pneumonia is also present elsewhere throughout  both lungs.  2. Reactive right hilar and subcarinal mediastinal  lymphadenopathy.  3. Mild cardiomegaly without evidence of pulmonary edema.  4. Small right pleural effusion. There is also a small amount of  pleural fluid in the right upper thorax adjacent to the lung  abscess, but there is no pleural enhancement to confirm empyema.  5. Old granulomatous disease.  6. Air-fluid level in the esophagus without evidence of hiatal  hernia, GE reflux and/or esophageal dysmotility.  7. Probable sebaceous cyst in the right chest wall.  8. Spinal stenosis suspected at the C6-7 level due to a chronic  disc herniation with calcification in the posterior annular fibers.  BC's negative and urine cx NEG  Studies/Results: No results found.  Scheduled Meds: . acetaminophen  500 mg Oral BID  .  albuterol  2.5 mg Nebulization QID  . aspirin EC  81 mg Oral q morning - 10a  . clindamycin (CLEOCIN) IV  600 mg Intravenous Q8H  . diltiazem  180 mg Oral q morning - 10a  . enoxaparin (LOVENOX) injection  40 mg Subcutaneous QHS  . famotidine  20 mg Oral QHS  . feeding supplement  1 Container Oral TID BM  . guaiFENesin  1,200 mg Oral BID  . iron polysaccharides  150 mg Oral Daily  . levofloxacin (LEVAQUIN) IV  750 mg Intravenous Daily  . levothyroxine  50 mcg Oral QAC breakfast  . Memantine HCl ER  1 capsule Oral q morning - 10a  . mometasone-formoterol  2  puff Inhalation BID  . multivitamin-lutein  1 capsule Oral Q lunch  . polyethylene glycol  17 g Oral Daily  . saccharomyces boulardii  250 mg Oral BID  . vancomycin  1,000 mg Intravenous Q24H   Continuous Infusions: . 0.9 % NaCl with KCl 20 mEq / L 50 mL/hr at 04/10/13 2102   PRN Meds:acetaminophen, acetaminophen, alum & mag hydroxide-simeth, HYDROcodone-acetaminophen, nitroGLYCERIN, promethazine, senna-docusate, zolpidem  Assessment/Plan:  Multifocal pneumonia with lung abscess: He is now on clindamycin as well as Levaquin and vancomycin. He is remarkably nontoxic appearing at the present time. His white blood count is been fine. I don't see any indication for any percutaneous drainage at this point. This is in the setting of known bronchiectasis and prior MAC. CT does not suggest that this pleural fluid   represents an empyema Anemia: This we rechecked in the morning,  We'll check iron status as well Constipation: Going to add some MiraLAX  COPD: This is stable Atrial fibrillation: Rate is stable Hypertension: This looks fine Hypothyroidism: He is on medications Dementia: He is on Namenda Coronary artery disease: He has no cardiac chest pain Hyponatremia, mild Hypokalemia, improved  LOS: 3 days   Shalita Notte ALAN 04/12/2013, 10:03 AM

## 2013-04-13 ENCOUNTER — Inpatient Hospital Stay (HOSPITAL_COMMUNITY): Payer: Medicare Other

## 2013-04-13 DIAGNOSIS — J189 Pneumonia, unspecified organism: Principal | ICD-10-CM

## 2013-04-13 LAB — FOLATE: Folate: 12.9 ng/mL

## 2013-04-13 LAB — VITAMIN B12: Vitamin B-12: 452 pg/mL (ref 211–911)

## 2013-04-13 LAB — IRON AND TIBC
Saturation Ratios: 16 % — ABNORMAL LOW (ref 20–55)
TIBC: 192 ug/dL — ABNORMAL LOW (ref 215–435)

## 2013-04-13 LAB — CBC
Hemoglobin: 8.4 g/dL — ABNORMAL LOW (ref 13.0–17.0)
MCHC: 35.1 g/dL (ref 30.0–36.0)
Platelets: 328 10*3/uL (ref 150–400)
RBC: 2.44 MIL/uL — ABNORMAL LOW (ref 4.22–5.81)

## 2013-04-13 LAB — COMPREHENSIVE METABOLIC PANEL
CO2: 23 mEq/L (ref 19–32)
Calcium: 8.4 mg/dL (ref 8.4–10.5)
Creatinine, Ser: 1.08 mg/dL (ref 0.50–1.35)
GFR calc Af Amer: 70 mL/min — ABNORMAL LOW (ref >90)
GFR calc non Af Amer: 60 mL/min — ABNORMAL LOW (ref >90)
Glucose, Bld: 98 mg/dL (ref 70–99)

## 2013-04-13 MED ORDER — LEVOFLOXACIN 750 MG PO TABS
750.0000 mg | ORAL_TABLET | Freq: Every day | ORAL | Status: DC
  Filled 2013-04-13: qty 1

## 2013-04-13 MED ORDER — SODIUM CHLORIDE 0.9 % IJ SOLN: 10.0000 mL | INTRAMUSCULAR | Status: DC | PRN

## 2013-04-13 MED ORDER — SODIUM CHLORIDE 0.9 % IJ SOLN
10.0000 mL | Freq: Two times a day (BID) | INTRAMUSCULAR | Status: DC
  Administered 2013-04-14: 10 mL

## 2013-04-13 MED ORDER — CLINDAMYCIN HCL 300 MG PO CAPS
300.0000 mg | ORAL_CAPSULE | Freq: Four times a day (QID) | ORAL | Status: DC
  Administered 2013-04-13 – 2013-04-14 (×2): 300 mg via ORAL
  Filled 2013-04-13 (×6): qty 1

## 2013-04-13 NOTE — Consult Note (Signed)
PULMONARY  / CRITICAL CARE MEDICINE  Name: [name] MRN: [id number] DOB: 16-Oct-1924    ADMISSION DATE:  04/09/2013 CONSULTATION DATE:  5/27  REFERRING MD :  Jacky Kindle  PRIMARY SERVICE:  same CHIEF COMPLAINT:  Pulmonary abscess   BRIEF PATIENT DESCRIPTION:  79 yo male w/ PMH of MAI, BTX and COPD, admitted on 5/23 w/ what was eventually identified as a RUL cavitary PNA/abscess. He had improved on IV antibiotics. PCCM asked to see on 5/27 to identify antibiotics and length of therapy.   SIGNIFICANT EVENTS / STUDIES:  CT chest 5/24: 1. Pneumonia involving the right upper lobe with associated lung abscess, accounting for the cavitary lesion on recent chest imaging. Patchy pneumonia is also present elsewhere throughout both lungs.2. Reactive right hilar and subcarinal mediastinallymphadenopathy.  LINES / TUBES: PICC line RUE 5/27>>>  CULTURES: UC 5/23: mult orgs.   ANTIBIOTICS: 5/23 clinda >>> (switched to oral 5/27)>>> levaquin 5/23 >>> (switched to oral 5/27)>>> vanc 5/23>>>5/27  HISTORY OF PRESENT ILLNESS:   -year-old Caucasian man with several medical problems, including COPD with bronchiectasis, history of Mycobacterium avium intracellulare pneumonia, coronary artery disease, hypertension, and frailty. He was admitted on 5/23 w/ 1 week h/o intermittent fevers with progressive fatigue. He was seen in the emergency room on May 16 and was felt to have a nonacute presentation, so he was started on a Z-Pak. Subsequently he was seen in his PCP's office and Levaquin was added to his regimen. He continued to have low-grade fevers at home. On 5/23 seen in the office with malaise, intermittent altered mental status according to his wife, and intermittent fevers.  He was sent to the ER.  Workup in the emergency room showed a low-grade fever with worsening airspace disease in the right upper lobe. He was treated with IV antibiotics, supplemental oxygen, and had a CT chest on 5/24 that showed a  large RUL pulmonary abscess. He has since had PICC line placed, and abx coverage has consisted of levaquin, clinda and vanc. He has clinically improved. We have been asked to make recs on antibiotics and course of therapy.  PAST MEDICAL HISTORY :  Past Medical History  Diagnosis Date  . Allergic rhinitis   . 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 COPD (chronic obstructive pulmonary disease)   . Diverticula of colon   . Right leg DVT 01/2010  . IHD (ischemic heart disease)   . Hyperlipidemia   . Hypertension   . Depression   . Myalgia   . Hypothyroidism   . Bronchiectasis   . Spinal stenosis   . History of heartburn   . Joint pain   . 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 Coronary artery disease   . Pneumonia   . Arthritis   . CHF (congestive heart failure)    Past Surgical History  Procedure Laterality Date  . Cardiac catheterization  06/13/2000    normal. ef 60%  . Cardiovascular stress test  04/2010    ef 54%, and normal perfusion  . Coronary angioplasty  1991    1st diagonal  . Carpal tunnel release      right  . Rotator cuff repair      left shoulder  . Knee surgery  August  2006    S/P right knee replacement  . Laminectomy      2005  . Cataract extraction      Bilateral    Prior to Admission  medications   Medication Sig Start Date End Date Taking? Authorizing Provider  acetaminophen (TYLENOL) 500 MG tablet Take 500 mg by mouth 2 (two) times daily. pain   Yes Historical Provider, MD  aspirin EC 81 MG tablet Take 81 mg by mouth every morning.   Yes Historical Provider, MD  diltiazem (DILACOR XR) 180 MG 24 hr capsule Take 180 mg by mouth every morning.   Yes Historical Provider, MD  famotidine (PEPCID) 20 MG tablet Take 20 mg by mouth at bedtime.   Yes Historical Provider, MD  ferrous sulfate 325 (65 FE) MG tablet Take 325 mg by mouth daily with lunch.   Yes Historical Provider, MD  fluticasone (FLONASE) 50 MCG/ACT nasal spray  Place 2 sprays into the nose 2 (two) times daily.   Yes Historical Provider, MD  Fluticasone-Salmeterol (ADVAIR DISKUS) 250-50 MCG/DOSE AEPB Inhale 1 puff into the lungs every 12 (twelve) hours.   Yes Historical Provider, MD  furosemide (LASIX) 40 MG tablet Take 40 mg by mouth every morning.   Yes Historical Provider, MD  guaiFENesin (MUCINEX) 600 MG 12 hr tablet Take 1,200 mg by mouth 2 (two) times daily.   Yes Historical Provider, MD  levothyroxine (SYNTHROID, LEVOTHROID) 50 MCG tablet Take 50 mcg by mouth every morning.    Yes Historical Provider, MD  Memantine HCl ER (NAMENDA XR) 28 MG CP24 Take 1 capsule by mouth every morning.   Yes Historical Provider, MD  Multiple Vitamins-Minerals (PRESERVISION AREDS) CAPS Take 1 capsule by mouth daily with lunch.    Yes Historical Provider, MD  nitroGLYCERIN (NITROSTAT) 0.4 MG SL tablet Place 0.4 mg under the tongue every 5 (five) minutes as needed. May repeat x3    Historical Provider, MD   Allergies  Allergen Reactions  . Sulfonamide Derivatives Other (See Comments)    unknown  . Penicillins Rash    FAMILY HISTORY:  Family History  Problem Relation Age of Onset  . Heart disease Father   . Heart failure Father   . Breast cancer Maternal Aunt    SOCIAL HISTORY:  reports that he quit smoking about 60 years ago. His smoking use included Cigarettes. He has a 25 pack-year smoking history. He has never used smokeless tobacco. He reports that he does not drink alcohol or use illicit drugs.  REVIEW OF SYSTEMS:   Constitutional: Negative for fever, chills, weight loss, malaise/fatigue and diaphoresis. feels better  HENT: Negative for hearing loss, ear pain, nosebleeds, congestion, sore throat, neck pain, tinnitus and ear discharge.   Eyes: Negative for blurred vision, double vision, photophobia, pain, discharge and redness.  Respiratory: Negative for cough, hemoptysis, sputum production, shortness of breath, wheezing and stridor.   Cardiovascular:  Negative for chest pain, palpitations, orthopnea, claudication, leg swelling and PND.  Gastrointestinal: Negative for heartburn, nausea, vomiting, abdominal pain, diarrhea, constipation, blood in stool and melena.  Genitourinary: Negative for dysuria, urgency, frequency, hematuria and flank pain.  Musculoskeletal: Negative for myalgias, back pain, joint pain and falls.  Skin: Negative for itching and rash.  Neurological: Negative for dizziness, tingling, tremors, sensory change, speech change, focal weakness, seizures, loss of consciousness, weakness and headaches.  Endo/Heme/Allergies: Negative for environmental allergies and polydipsia. Does not bruise/bleed easily.  SUBJECTIVE:  Feels better VITAL SIGNS: Temp:  [98.4 F (36.9 C)-98.5 F (36.9 C)] 98.5 F (36.9 C) (05/27 0645) Pulse Rate:  [68-73] 68 (05/27 0645) Resp:  [16-20] 20 (05/27 0645) BP: (114-128)/(57-60) 128/60 mmHg (05/27 0645) SpO2:  [95 %-98 %] 95 % (05/27  1610)  PHYSICAL EXAMINATION: General:  Chronically ill appearing elderly male, in no acute distress.  Neuro:  Intact  HEENT:  Intact, no JVD Cardiovascular:  rrr Lungs:  Rales RUL Abdomen:  Soft, non-tender  Musculoskeletal:  Intact  Skin:  Scattered areas of ecchymosis    Recent Labs Lab 04/10/13 0440 04/11/13 0449 04/13/13 0422  NA 135 132* 125*  K 3.4* 3.8 4.2  CL 101 101 94*  CO2 24 23 23   BUN 16 12 11   CREATININE 1.17 1.13 1.08  GLUCOSE 103* 94 98    Recent Labs Lab 04/10/13 0440 04/11/13 0449 04/13/13 0422  HGB 8.1* 8.4* 8.4*  HCT 22.8* 25.0* 23.9*  WBC 5.5 5.9 5.8  PLT 231 269 328   Dg Chest 2 View  04/12/2013   *RADIOLOGY REPORT*  Clinical Data: Evaluate pneumonia, cough, congestion  CHEST - 2 VIEW  Comparison: 04/09/2013; 04/02/2013; chest CT - 04/10/2013  Findings:  Grossly unchanged enlarged cardiac silhouette and mediastinal contours with atherosclerotic calcifications within the thoracic aorta.  Heterogeneous / consolidative  opacities and volume loss within the right upper lobe are grossly unchanged.  Grossly unchanged heterogeneous airspace opacities within the peripheral aspect of the left upper lung are also unchanged.  Unchanged small bilateral pleural effusions, right greater than left.  There is grossly unchanged mild diffuse slightly nodular thickening of the interstitium.  No new focal airspace opacities.  No definite pneumothorax.  Unchanged bones.  IMPRESSION: Grossly unchanged findings suggestive of multifocal infection, most severely affecting the right upper lung.  A follow-up chest radiograph in 4 to 6 weeks after treatment is recommended to ensure resolution.   Original Report Authenticated By: Tacey Ruiz, MD   Dg Chest Port 1 View  04/13/2013   *RADIOLOGY REPORT*  Clinical Data: Check PICC line placement  PORTABLE CHEST - 1 VIEW  Comparison: 04/12/2013  Findings: Persistent changes are noted in the upper lobes bilaterally.  A PICC line has been placed with catheter tip at the level of the carina within the superior vena cava.  Some compression deformities of the thoracic spine are noted.  IMPRESSION: Status post PICC line placement with the tip in the superior vena cava.  The chest is otherwise stable from previous exam.   Original Report Authenticated By: Alcide Clever, M.D.    ASSESSMENT / PLAN: Cavitary PNA/lung abscess. Clinically improved on current therapy. Pulmonary abscess are high risk for polymicrobial infection, although MRSA probably not the organism here.  Recommendation -transition to oral clinda and Levaquin  -would continue out-pt abx for three weeks, obtain CXR and continue abx until resolution of abscess. Tuesday June 17th at 10am then PA/LAT CXR and f/u w/ Wert at 1030am  Pulmonary and Critical Care Medicine [facility] Pager: [phone]  04/13/2013, 2:17 PM  Reviewed above, examined pt, and agree with assessment/plan.  He has lung abscess mostly involving RUL.  He has hx  of PCN allergy.  He has responded to current Abx regimen.  No indication for MRSA as contributor, and can d/c vancomycin.  Recommend continuing levaquin and clindamycin >> both of these can be given orally.  Recommend several weeks of antibiotic therapy.  He is set up for outpt follow up with Dr. Sherene Sires on June 17.  Can then better determine duration of antibiotics.  Updated pt's daughter about plan.  PCCM can be available as needed while he remains in hospital.  Coralyn Helling, MD [facility] Pulmonary/Critical Care 04/13/2013, 4:45 PM Pager:  [phone] After 3pm call: [phone]

## 2013-04-13 NOTE — Clinical Documentation Improvement (Signed)
PNEUMONIA DOCUMENTATION CLARIFICATION QUERY  THIS DOCUMENT IS NOT A PERMANENT PART OF THE MEDICAL RECORD  TO RESPOND TO THE THIS QUERY, FOLLOW THE INSTRUCTIONS BELOW:  1. If needed, update documentation for the patient's encounter via the notes activity.  2. Access this query again and click edit on the [employer].  3. After updating, or not, click F2 to complete all highlighted (required) fields concerning your review. Select "additional documentation in the medical record" OR "no additional documentation provided".  4. Click Sign note button.  5. The deficiency will fall out of your InBasket *Please let us know if you are not able to complete this workflow by phone or e-mail (listed below).  Please update your documentation within the medical record to reflect your response to this query.                                                                                    04/13/13  Dear Dr.R Jacky Kindle [facility]  In a better effort to capture your patient's severity of illness, reflect appropriate length of stay and utilization of resources, a review of the patient medical record has revealed the following indicators.    Based on your clinical judgment, please clarify and document in a progress note and/or discharge summary the clinical condition associated with the following supporting information:  In responding to this query please exercise your independent judgment.  The fact that a query is asked, does not imply that any particular answer is desired or expected.  04/13/13 Prog Note..."pneumonia/pulmonary abscess with baseline chronic lung disease to include bronchiectasis and prior MAI- triple antibiotics after having failed outpatient Levaquin. Suspect we'll need a 4 to six-week course of IV antibiotics and a PICC line..."  For accurate Dx specificity & severity and after study can "pneumonia" be further specified". Thank you  Possible Clinical Conditions?  Aspiration  Pneumonia (POA?) Gram Negative Pneumonia (POA?)  Bacterial pneumonia, specify type if known (POA?) Viral PNA (POA?)  Pneumonia (CAP, HAP) (POA?) Other Condition Cannot Clinically Determine   Supporting Information: Risk Factors: see above note  Signs & Symptoms: see above note  Diagnostics: see above note Radiology Swallow eval:  Treatment: See above note  You may use possible, probable, or suspect with inpatient documentation. possible, probable, suspected diagnoses MUST be documented at the time of discharge  Reviewed:  no additional documentation provided: 04/20/13 rev'd and no further action req per code>closed. orm  Thank You, Toribio Harbour, RN, BSN, CCDS Certified Clinical Documentation Specialist Pager: [phone]  Health Information Management [place]

## 2013-04-13 NOTE — Progress Notes (Signed)
Peripherally Inserted Central Catheter/Midline Placement  The IV Nurse has discussed with the patient and/or persons authorized to consent for the patient, the purpose of this procedure and the potential benefits and risks involved with this procedure.  The benefits include less needle sticks, lab draws from the catheter and patient may be discharged home with the catheter.  Risks include, but not limited to, infection, bleeding, blood clot (thrombus formation), and puncture of an artery; nerve damage and irregular heat beat.  Alternatives to this procedure were also discussed. Wife signed consent.  PICC/Midline Placement Documentation        [name] 04/13/2013, 11:56 AM

## 2013-04-13 NOTE — Progress Notes (Signed)
Clinical Social Work Department BRIEF PSYCHOSOCIAL ASSESSMENT 04/13/2013  Patient:  [name], [name]     Account Number:  [id number]     Admit date:  04/09/2013  Clinical Social Worker:  Candie Chroman  Date/Time:  04/13/2013 02:36 PM  Referred by:  Physician  Date Referred:  04/13/2013 Referred for  SNF Placement   Other Referral:   Interview type:  Family Other interview type:    PSYCHOSOCIAL DATA Living Status:  FACILITY Admitted from facility:  [facility] [place] Level of care:  Independent Living Primary support name:  [name] Primary support relationship to patient:  SPOUSE Degree of support available:   supportive    CURRENT CONCERNS Current Concerns  Post-Acute Placement   Other Concerns:    SOCIAL WORK ASSESSMENT / PLAN Pt is an 79 yr old gentleman living at home prior to hospitalization. CSW met with pt/spouse to assist with d/c planning. Pt will need several weeks of antibiotics following hospital d/c. [facility] contacted and is able to provide  a SNF bed for pt when stable for d/c. Spouse has been updated. CSW will continue to follow to assist with d/c planning to SNF.   Assessment/plan status:   Other assessment/ plan:   Information/referral to community resources:   None needed at this time.    PATIENT'S/FAMILY'S RESPONSE TO PLAN OF CARE: Pt/ family are pleased to hear that [facility] can assist with SNF bed.    [name] Razor LCSW [phone]

## 2013-04-13 NOTE — Progress Notes (Signed)
Subjective: Patient looks great sitting up in bed eating fair for him mentally at baseline clearly not ill with this at this point  Objective: Vital signs in last 24 hours: Temp:  [98.4 F (36.9 C)-98.5 F (36.9 C)] 98.5 F (36.9 C) (05/27 0645) Pulse Rate:  [68-73] 68 (05/27 0645) Resp:  [16-20] 20 (05/27 0645) BP: (114-128)/(57-60) 128/60 mmHg (05/27 0645) SpO2:  [95 %-99 %] 95 % (05/27 0856) Weight change:   CBG (last 3)   Recent Labs  04/11/13 1739  GLUCAP 99    Intake/Output from previous day: 05/26 0701 - 05/27 0700 In: 1299.2 [P.O.:480; I.V.:819.2] Out: 400 [Urine:400]  Physical Exam: Awake alert interactive conversant and appropriate. No JVD or bruits. Lungs are grossly clear. Cardiovascular regular rate and rhythm distant heart sounds. Abdomen soft and nontender. Extremities no edema intact pulses   Lab Results:  Recent Labs  04/11/13 0449 04/13/13 0422  NA 132* 125*  K 3.8 4.2  CL 101 94*  CO2 23 23  GLUCOSE 94 98  BUN 12 11  CREATININE 1.13 1.08  CALCIUM 8.4 8.4    Recent Labs  04/13/13 0422  AST 13  ALT 6  ALKPHOS 53  BILITOT 0.2*  PROT 6.6  ALBUMIN 2.3*    Recent Labs  04/11/13 0449 04/13/13 0422  WBC 5.9 5.8  HGB 8.4* 8.4*  HCT 25.0* 23.9*  MCV 100.4* 98.0  PLT 269 328   Lab Results  Component Value Date   INR 1.10 07/04/2012   INR 2.19* 05/03/2010   INR 2.29* 05/02/2010   No results found for this basename: CKTOTAL, CKMB, CKMBINDEX, TROPONINI,  in the last 72 hours No results found for this basename: TSH, T4TOTAL, FREET3, T3FREE, THYROIDAB,  in the last 72 hours No results found for this basename: VITAMINB12, FOLATE, FERRITIN, TIBC, IRON, RETICCTPCT,  in the last 72 hours  Studies/Results: Dg Chest 2 View  04/12/2013   *RADIOLOGY REPORT*  Clinical Data: Evaluate pneumonia, cough, congestion  CHEST - 2 VIEW  Comparison: 04/09/2013; 04/02/2013; chest CT - 04/10/2013  Findings:  Grossly unchanged enlarged cardiac silhouette and  mediastinal contours with atherosclerotic calcifications within the thoracic aorta.  Heterogeneous / consolidative opacities and volume loss within the right upper lobe are grossly unchanged.  Grossly unchanged heterogeneous airspace opacities within the peripheral aspect of the left upper lung are also unchanged.  Unchanged small bilateral pleural effusions, right greater than left.  There is grossly unchanged mild diffuse slightly nodular thickening of the interstitium.  No new focal airspace opacities.  No definite pneumothorax.  Unchanged bones.  IMPRESSION: Grossly unchanged findings suggestive of multifocal infection, most severely affecting the right upper lung.  A follow-up chest radiograph in 4 to 6 weeks after treatment is recommended to ensure resolution.   Original Report Authenticated By: Tacey Ruiz, MD     Assessment/Plan: #1 pneumonia/pulmonary abscess with baseline chronic lung disease to include bronchiectasis and prior MAI- triple antibiotics after having failed outpatient Levaquin. Suspect we'll need a 4 to six-week course of IV antibiotics and a PICC line; we'll consult pulmonary who follows him to get a definitive plan of duration of course and IV antibiotic therapy #2 hyponatremia probably SIADH will follow #3 mild dementia #4 depression  Plan pulmonary consult to help define duration of therapy and final antibiotic choice prior to transfer to [facility]  LOS: 4 days   [name] A 04/13/2013, 9:01 AM

## 2013-04-13 NOTE — Progress Notes (Signed)
    CARE MANAGEMENT NOTE 04/13/2013  Patient:  [name], [name]   Account Number:  [id number]  Date Initiated:  04/13/2013  Documentation initiated by:  Ezekiel Ina  Subjective/Objective Assessment:   PT ADMITTED WITH [facility] fevers, COPD, AMS,     Action/Plan:   from home/plan to dc to [facility]   Anticipated DC Date:  04/14/2013   Anticipated DC Plan:  SKILLED NURSING FACILITY  In-house referral  Clinical Social Worker      DC Planning Services  CM consult      Choice offered to / List presented to:             Status of service:  In process, will continue to follow Medicare Important Message given?  NA - LOS <3 / Initial given by admissions (If response is "NO", the following Medicare IM given date fields will be blank) Date Medicare IM given:  04/09/2013 Date Additional Medicare IM given:    Discharge Disposition:    Per UR Regulation:  Reviewed for med. necessity/level of care/duration of stay  If discussed at Long Length of Stay Meetings, dates discussed:    Comments:  04/13/13 MMcGibboney, RN, BSN Plan to dc to SNF, CSW to follow.

## 2013-04-14 ENCOUNTER — Institutional Professional Consult (permissible substitution): Payer: Medicare Other | Admitting: Internal Medicine

## 2013-04-14 LAB — CBC
MCH: 34.3 pg — ABNORMAL HIGH (ref 26.0–34.0)
MCHC: 35.8 g/dL (ref 30.0–36.0)
MCV: 95.6 fL (ref 78.0–100.0)
Platelets: 381 10*3/uL (ref 150–400)
RBC: 2.51 MIL/uL — ABNORMAL LOW (ref 4.22–5.81)

## 2013-04-14 LAB — BASIC METABOLIC PANEL
BUN: 10 mg/dL (ref 6–23)
CO2: 23 mEq/L (ref 19–32)
Calcium: 8.4 mg/dL (ref 8.4–10.5)
Creatinine, Ser: 1 mg/dL (ref 0.50–1.35)
GFR calc non Af Amer: 66 mL/min — ABNORMAL LOW (ref >90)
Glucose, Bld: 92 mg/dL (ref 70–99)

## 2013-04-14 LAB — PROTEIN ELECTROPHORESIS, SERUM
Albumin ELP: 42.5 % — ABNORMAL LOW (ref 55.8–66.1)
Alpha-1-Globulin: 7.9 % — ABNORMAL HIGH (ref 2.9–4.9)
Beta 2: 7.2 % — ABNORMAL HIGH (ref 3.2–6.5)
Beta Globulin: 6.7 % (ref 4.7–7.2)

## 2013-04-14 MED ORDER — LEVOFLOXACIN IN D5W 750 MG/150ML IV SOLN: 750.0000 mg | INTRAVENOUS | Status: DC

## 2013-04-14 MED ORDER — CLINDAMYCIN PHOSPHATE 300 MG/50ML IV SOLN
300.0000 mg | Freq: Two times a day (BID) | INTRAVENOUS | Status: DC
  Administered 2013-04-14: 300 mg via INTRAVENOUS
  Filled 2013-04-14 (×2): qty 50

## 2013-04-14 MED ORDER — ENSURE PUDDING PO PUDG: 1.0000 | Freq: Three times a day (TID) | ORAL | Status: AC

## 2013-04-14 MED ORDER — MOMETASONE FURO-FORMOTEROL FUM 100-5 MCG/ACT IN AERO: 2.0000 | INHALATION_SPRAY | Freq: Two times a day (BID) | RESPIRATORY_TRACT | Status: DC

## 2013-04-14 MED ORDER — CLINDAMYCIN PHOSPHATE 300 MG/50ML IV SOLN: 300.0000 mg | Freq: Two times a day (BID) | INTRAVENOUS | Status: DC

## 2013-04-14 MED ORDER — ALBUTEROL SULFATE (5 MG/ML) 0.5% IN NEBU: 2.5000 mg | INHALATION_SOLUTION | Freq: Four times a day (QID) | RESPIRATORY_TRACT | Status: DC

## 2013-04-14 MED ORDER — LEVOFLOXACIN IN D5W 750 MG/150ML IV SOLN
750.0000 mg | INTRAVENOUS | Status: DC
  Administered 2013-04-14: 750 mg via INTRAVENOUS
  Filled 2013-04-14: qty 150

## 2013-04-14 NOTE — Progress Notes (Signed)
Pt to d/c to SNF at [facility]. Report given to [facility]. Pt assessed for pain, which he denied. Peripheral IV removed. PICC line in place and capped for prolonged antibiotics. Pt in no distress and ready for d/c.

## 2013-04-14 NOTE — Progress Notes (Signed)
Clinical Social Work Department CLINICAL SOCIAL WORK PLACEMENT NOTE 04/14/2013  Patient:  [name], [name]  Account Number:  [id number] Admit date:  04/09/2013  Clinical Social Worker:  Jacelyn Grip  Date/time:  04/14/2013 11:00 AM  Clinical Social Work is seeking post-discharge placement for this patient at the following level of care:   SKILLED NURSING   (*CSW will update this form in Epic as items are completed)   04/13/2013  Patient/family provided with [facility] Department of Clinical Social Work's list of facilities offering this level of care within the geographic area requested by the patient (or if unable, by the patient's family).  04/13/2013  Patient/family informed of their freedom to choose among providers that offer the needed level of care, that participate in Medicare, Medicaid or managed care program needed by the patient, have an available bed and are willing to accept the patient.  04/13/2013  Patient/family informed of [facility]' ownership interest in [facility], as well as of the fact that they are under no obligation to receive care at this facility.  PASARR submitted to EDS on 04/13/2013 PASARR number received from EDS on 04/13/2013  FL2 transmitted to all facilities in geographic area requested by pt/family on  04/13/2013 FL2 transmitted to all facilities within larger geographic area on   Patient informed that his/her managed care company has contracts with or will negotiate with  certain facilities, including the following:     Patient/family informed of bed offers received:  04/14/2013 Patient chooses bed at [facility] Physician recommends and patient chooses bed at    Patient to be transferred to [facility] [place] on  04/14/2013 Patient to be transferred to facility by ambulance Sharin Mons)  The following physician request were entered in Epic:   Additional Comments:    Jacklynn Lewis, MSW, LCSWA  Clinical  Social Work [phone]

## 2013-04-14 NOTE — Progress Notes (Signed)
Pt for discharge to [facility] SNF.  CSW facilitated pt discharge needs including contacting facility, faxing pt discharge information via [facility],discussing with pt and pt wife at bedside, providing RN phone number to call report, and requesting ambulance transportation for pt to [facility].  CSW noted that pt is DNR code status in EPIC. No Gold DNR form signed by MD. CSW spoke with MD, Dr. Jacky Kindle who stated that pt would have to travel to SNF as FULL code and have SNF MD place Gold DNR at SNF. CSW discussed this with pt and pt wife who are agreeable to transporting to [facility] as full code and provided pt wife a Gold DNR form to have completed at next office visit by MD.  CSW notified ambulance ([facility]) as well.  No further social work needs identified at this time.   CSW signing off.   Jacklynn Lewis, MSW, LCSWA  Clinical Social Work [phone]

## 2013-04-14 NOTE — Discharge Summary (Signed)
DISCHARGE SUMMARY  [name]  MR#: [id number]  DOB:1924/05/22  Date of Admission: 04/09/2013 Date of Discharge: 04/14/2013  Attending Physician:Jaylie Neaves A  Patient's WUJ:WJXBJYN,WGNFAOZ A, MD  Consults: pulmonary  Discharge Diagnoses: Principal Problem:   Pneumonia Active Problems:   Pulmonary abscess   BRONCHIECTASIS W/O ACUTE EXACERBATION   CAD (coronary artery disease)   A-fib   Gait instability   Weakness    Dementia- alzheimers    SIADH     Essential hypertension   Discharge Medications:   Medication List    STOP taking these medications       ADVAIR DISKUS 250-50 MCG/DOSE Aepb  Generic drug:  Fluticasone-Salmeterol  Replaced by:  mometasone-formoterol 100-5 MCG/ACT Aero     furosemide 40 MG tablet  Commonly known as:  LASIX      TAKE these medications       acetaminophen 500 MG tablet  Commonly known as:  TYLENOL  Take 500 mg by mouth 2 (two) times daily. pain     albuterol (5 MG/ML) 0.5% nebulizer solution  Commonly known as:  PROVENTIL  Take 0.5 mLs (2.5 mg total) by nebulization 4 (four) times daily.     aspirin EC 81 MG tablet  Take 81 mg by mouth every morning.     clindamycin 300 MG/50ML IVPB  Commonly known as:  CLEOCIN  Inject 50 mLs (300 mg total) into the vein every 12 (twelve) hours.     diltiazem 180 MG 24 hr capsule  Commonly known as:  DILACOR XR  Take 180 mg by mouth every morning.     famotidine 20 MG tablet  Commonly known as:  PEPCID  Take 20 mg by mouth at bedtime.     feeding supplement Pudg  Take 1 Container by mouth 3 (three) times daily between meals.     ferrous sulfate 325 (65 FE) MG tablet  Take 325 mg by mouth daily with lunch.     fluticasone 50 MCG/ACT nasal spray  Commonly known as:  FLONASE  Place 2 sprays into the nose 2 (two) times daily.     guaiFENesin 600 MG 12 hr tablet  Commonly known as:  MUCINEX  Take 1,200 mg by mouth 2 (two) times daily.     levofloxacin 750 MG/150ML Soln   Commonly known as:  LEVAQUIN  Inject 150 mLs (750 mg total) into the vein daily.     levothyroxine 50 MCG tablet  Commonly known as:  SYNTHROID, LEVOTHROID  Take 50 mcg by mouth every morning.     mometasone-formoterol 100-5 MCG/ACT Aero  Commonly known as:  DULERA  Inhale 2 puffs into the lungs 2 (two) times daily.     NAMENDA XR 28 MG Cp24  Generic drug:  Memantine HCl ER  Take 1 capsule by mouth every morning.     nitroGLYCERIN 0.4 MG SL tablet  Commonly known as:  NITROSTAT  Place 0.4 mg under the tongue every 5 (five) minutes as needed. May repeat x3     PreserVision AREDS Caps  Take 1 capsule by mouth daily with lunch.        Hospital Procedures: Dg Chest 2 View  04/12/2013   *RADIOLOGY REPORT*  Clinical Data: Evaluate pneumonia, cough, congestion  CHEST - 2 VIEW  Comparison: 04/09/2013; 04/02/2013; chest CT - 04/10/2013  Findings:  Grossly unchanged enlarged cardiac silhouette and mediastinal contours with atherosclerotic calcifications within the thoracic aorta.  Heterogeneous / consolidative opacities and volume loss within the right upper lobe are grossly  unchanged.  Grossly unchanged heterogeneous airspace opacities within the peripheral aspect of the left upper lung are also unchanged.  Unchanged small bilateral pleural effusions, right greater than left.  There is grossly unchanged mild diffuse slightly nodular thickening of the interstitium.  No new focal airspace opacities.  No definite pneumothorax.  Unchanged bones.  IMPRESSION: Grossly unchanged findings suggestive of multifocal infection, most severely affecting the right upper lung.  A follow-up chest radiograph in 4 to 6 weeks after treatment is recommended to ensure resolution.   Original Report Authenticated By: Tacey Ruiz, MD   Dg Chest 2 View  04/09/2013   *RADIOLOGY REPORT*  Clinical Data: Fever  CHEST - 2 VIEW  Comparison: 04/02/2013  Findings: Interval worsening of the dense right upper lobe  consolidation/fluid is noted.  Heart size is upper limits of normal.  Coarsened pulmonary parenchymal markings again noted. Area of probable scarring is noted at the right costophrenic angle. Upper thoracic spine kyphosis is present.  Left axillary clips are noted.  IMPRESSION: Worsening than right upper lobe consolidation/fluid.   Original Report Authenticated By: Christiana Pellant, M.D.   Dg Chest 2 View  04/02/2013   *RADIOLOGY REPORT*  Clinical Data: Fever and COPD.  Hypertension  CHEST - 2 VIEW  Comparison: 08/10/2012  Findings: Heart size is mildly enlarged.  There is no pleural effusion identified.  Again noted is a cavitary right upper lobe lung lesion.  This does not appear significantly changed when compared with previous exam. Diffusely coarsened interstitial markings are again noted bilaterally.  There is spondylosis noted throughout the thoracic spine.  IMPRESSION:  1.  Advanced changes of COPD. 2. Chronic cavitary lung process involving the right upper lobe appears similar to previous examination.   Original Report Authenticated By: Signa Kell, M.D.   Ct Head Wo Contrast  04/09/2013   *RADIOLOGY REPORT*  Clinical Data: 1-week history of fevers, found momentarily unresponsive earlier today.  CT HEAD WITHOUT CONTRAST  Technique:  Contiguous axial images were obtained from the base of the skull through the vertex without contrast.  Comparison: CT head 08/03/2012, 07/04/2012, 10/15/2011, 12/30/2006.  Findings: Moderate to severe cortical atrophy, mild deep atrophy, and moderate to severe changes of small vessel disease of the white matter diffusely, slightly progressive since 2008 but unchanged since last year.  Old lacunar stroke in the left basal ganglia, unchanged.  No mass lesion.  No midline shift.  No acute hemorrhage or hematoma.  No extra-axial fluid collections.  No evidence of acute infarction.  No skull fracture or other focal osseous abnormality involving the skull.  Mucous retention cyst  or polyp in the left maxillary sinus. Remaining visualized paranasal sinuses, bilateral mastoid air cells, and bilateral middle ear cavities well-aerated.  Mild bilateral carotid siphon and right vertebral artery atherosclerosis.  IMPRESSION:  1.  No acute intracranial abnormality. 2.  Generalized atrophy and chronic microvascular ischemic changes of the white matter, progressive since 2008 but unchanged since last year. 3.  Chronic left maxillary sinusitis.   Original Report Authenticated By: Hulan Saas, M.D.   Ct Chest W Contrast  04/10/2013   *RADIOLOGY REPORT*  Clinical Data: Recent abnormal chest imaging with right upper lobe cavitary opacity.  CT CHEST WITH CONTRAST  Technique:  Multidetector CT imaging of the chest was performed following the standard protocol during bolus administration of intravenous contrast.  Contrast: OMNIPAQUE IOHEXOL 300 MG/ML IV.  Comparison: Two-view chest x-ray 04/09/2013, 04/02/2013, 08/10/2012.  CT chest 10/05/2007.  Findings: Airspace consolidation with air  bronchograms in the right apex.  Necrosis with cavitation in the right upper lobe parenchyma, with air-fluid levels.  Parenchymal calcifications in the area of consolidation and necrosis, consistent with calcified granulomata. Small amount of pleural fluid immediately adjacent to the parenchymal necrosis, without evidence of pleural enhancement to suggest empyema.  Patchy airspace opacities in the left upper lobe, inferior right upper lobe, and both lower lobes.  Calcified granuloma laterally in the right lower lobe.  Chronic pleural thickening bilaterally. Small right pleural effusion dependently.  No definite left pleural effusion.  Heart enlarged with left ventricular predominance.  Severe LAD and left circumflex coronary atherosclerosis.  No pericardial effusion. Moderate atherosclerosis involving the thoracic and upper abdominal aorta without aneurysm or dissection.  Scattered upper normal size to slightly  enlarged mediastinal and right hilar lymph nodes, the largest in the right hilum measuring approximately 2.4 x 1.6 cm and in the subcarinal region of the mediastinum (station 7) measuring 1.3 x 1.9 cm.  No nodal masses. Visualized thyroid gland atrophic.  Air-fluid level in the esophagus without evidence of hiatal hernia. Numerous calcified granulomata in the spleen.  Calcified granuloma in the anterior segment right lobe of visualized liver.  Visualized upper abdomen otherwise unremarkable.  Probable sebaceous cyst in the subcutaneous fat of the right chest wall, not visualized on the prior CT.  Bone window images demonstrate exaggeration of the usual thoracic kyphosis, generalized osseous demineralization, and degenerative changes involving the lower cervical spine and throughout the thoracic spine; spinal stenosis is suspected at the C6-7 level due to a chronic disc herniation with calcification in the posterior annular fibers.  IMPRESSION:  1.  Pneumonia involving the right upper lobe with associated lung abscess, accounting for the cavitary lesion on recent chest imaging.  Patchy pneumonia is also present elsewhere throughout both lungs. 2.  Reactive right hilar and subcarinal mediastinal lymphadenopathy. 3.  Mild cardiomegaly without evidence of pulmonary edema. 4.  Small right pleural effusion.  There is also a small amount of pleural fluid in the right upper thorax adjacent to the lung abscess, but there is no pleural enhancement to confirm empyema. 5.  Old granulomatous disease. 6.  Air-fluid level in the esophagus without evidence of hiatal hernia, GE reflux and/or esophageal dysmotility. 7.  Probable sebaceous cyst in the right chest wall. 8.  Spinal stenosis suspected at the C6-7 level due to a chronic disc herniation with calcification in the posterior annular fibers.   Original Report Authenticated By: Hulan Saas, M.D.   Dg Chest Port 1 View  04/13/2013   *RADIOLOGY REPORT*  Clinical Data:  Check PICC line placement  PORTABLE CHEST - 1 VIEW  Comparison: 04/12/2013  Findings: Persistent changes are noted in the upper lobes bilaterally.  A PICC line has been placed with catheter tip at the level of the carina within the superior vena cava.  Some compression deformities of the thoracic spine are noted.  IMPRESSION: Status post PICC line placement with the tip in the superior vena cava.  The chest is otherwise stable from previous exam.   Original Report Authenticated By: Alcide Clever, M.D.    History of Present Illness:  The patient is an 79 year old Caucasian man with several medical problems, including COPD with bronchiectasis, history of Mycobacterium avium intracellulare pneumonia, coronary artery disease, hypertension, and frailty. About a week ago he began to have intermittent fevers with progressive fatigue. He was seen in the emergency room on May 16 and was felt to have a nonacute presentation, so he  was started on a Z-Pak. Subsequently he was seen in our office and Levaquin was added to his regimen. He has continued to have low-grade fevers at home that were not evident in the office and had CBC test in the office that did not show leukocytosis. He was seen earlier today in the office with malaise, intermittent altered mental status according to his wife, and intermittent fevers. In the office he did not have a fever and he was sent to the emergency room for further evaluation to include blood cultures and urine cultures. Workup in the emergency room showed a low-grade fever with worsening airspace disease in the right upper lobe, so he is being admitted for possible right upper lobe pneumonia. He has had a dry cough lately without significant shortness of breath. His appetite has been poor lately with little food and fluid intake. He has not had problems with nausea, vomiting, diarrhea, abdominal pain, dysuria, or frequency. In September 2013 he had an episode of pneumonia that left him  quite weak so he required skilled nursing facility rehabilitation. At that time he had a PPD test placed that did not show evidence of tuberculosis.  Hospital Course: Patient was admitted to the hospital after failing close to 7 days of outpatient oral antibiotics including at least one week of Levaquin 750 mg daily. Upon re\re presenting to the emergency room he was found to have bilateral patchy pneumonia superimposed upon his baseline chronic lung disease as well as a pulmonary abscess. He responded very well to IV Levaquin, IV clindamycin and IV vancomycin with defervesce is within 36 hours and he has remained afebrile for the first time in a week. He continues to have a nonproductive cough but no distress with good oxygen saturations. By mouth intake is marginal but this is his baseline. He is a bit weak and debilitated but prior to this was semi-independent in his ADLs and semi-ambulatory. He's had no nausea or vomiting. Sodium has remained low believed to be related to SIADH but he can cognitively at baseline bright alert interactive nose place and self as well as many but confused regarding circumstances in time. We did arrange pulmonary consultation and they agreed with simplification of the regimen suggest Levaquin and clindamycin. It was their suggestion to convert to orals at this point but I am hesitant given the failure as an outpatient on orals and the complexity of his pulmonary disease therefore will embark upon a course that is a compromise to complete 2 full weeks of IV therapy would then transitioning to oral therapy for a total course of minimum of 4 weeks but as long as 6 weeks depending upon his progress. [facility] as well as nutritional support and OT/PT.  Day of Discharge Exam BP 129/59  Pulse 66  Temp(Src) 98.3 F (36.8 C) (Oral)  Resp 18  Ht 6' (1.829 m)  Wt 79 kg (174 lb 2.6 oz)  BMI 23.62 kg/m2  SpO2 95%  Physical  Exam: General appearance: alert, cooperative, appears stated age and no distress Eyes: no scleral icterus Throat: oropharynx moist without erythema Resp: clear to auscultation bilaterally, prolonged expiratory phase no rales or rhonchi Cardio: regular rate and rhythm Extremities: no clubbing, cyanosis or edema Neurologically awake alert bitemporal wasting good facial symmetry cranial nerves intact diffusely weak but nonlateralizing. Cognitively alert interactive no self knows the hospital knows his wife and I a bit confused regarding date and time and circumstances Abdomen soft  nontender Back is kyphotic  Discharge Labs:  Recent Labs  04/13/13 0422 04/14/13 0600  NA 125* 125*  K 4.2 4.3  CL 94* 94*  CO2 23 23  GLUCOSE 98 92  BUN 11 10  CREATININE 1.08 1.00  CALCIUM 8.4 8.4    Recent Labs  04/13/13 0422  AST 13  ALT 6  ALKPHOS 53  BILITOT 0.2*  PROT 6.6  ALBUMIN 2.3*    Recent Labs  04/13/13 0422 04/14/13 0600  WBC 5.8 6.3  HGB 8.4* 8.6*  HCT 23.9* 24.0*  MCV 98.0 95.6  PLT 328 381   No results found for this basename: CKTOTAL, CKMB, CKMBINDEX, TROPONINI,  in the last 72 hours No results found for this basename: TSH, T4TOTAL, FREET3, T3FREE, THYROIDAB,  in the last 72 hours  Recent Labs  04/13/13 0422  VITAMINB12 452  FOLATE 12.9  FERRITIN 190  TIBC 192*  IRON 31*    Discharge instructions:     Discharge Orders   Future Appointments Provider Department Dept Phone   05/04/2013 10:30 AM Nyoka Cowden, MD [facility] [phone]   Future Orders Complete By Expires     Increase activity slowly  As directed        [facility]: [facility]  Please note patient will need his IV Levaquin and clindamycin until seeing me in the office June 6 at which point if he is afebrile and doing well clinically this may the 2 week mark we will convert him to oral Levaquin and oral clindamycin. He does have a PICC line in his Foley PICC line  care it is in the right upper extremity. Total duration of antibiotic therapy will take him through to a minimum of June 20 which before weeks but he may indeed need a total of 6 weeks depending upon radiographic changes and his course. Diet is regular diet with supplements. He will need monitoring of his be met in sodium is currently in the mid 120s and really gets above 1:30 his chronic SIADH he is cognitively intact. The aggressive OT and PT as he is deconditioned Signed: Yolunda Kloos A 04/14/2013, 7:42 AM

## 2013-04-15 ENCOUNTER — Ambulatory Visit: Payer: Medicare Other | Admitting: Cardiovascular Disease

## 2013-04-15 ENCOUNTER — Non-Acute Institutional Stay (SKILLED_NURSING_FACILITY): Payer: Medicare Other | Admitting: Nurse Practitioner

## 2013-04-15 DIAGNOSIS — D649 Anemia, unspecified: Secondary | ICD-10-CM

## 2013-04-15 DIAGNOSIS — R609 Edema, unspecified: Secondary | ICD-10-CM

## 2013-04-15 DIAGNOSIS — E039 Hypothyroidism, unspecified: Secondary | ICD-10-CM | POA: Insufficient documentation

## 2013-04-15 DIAGNOSIS — E871 Hypo-osmolality and hyponatremia: Secondary | ICD-10-CM

## 2013-04-15 DIAGNOSIS — F0390 Unspecified dementia without behavioral disturbance: Secondary | ICD-10-CM | POA: Insufficient documentation

## 2013-04-15 DIAGNOSIS — I4891 Unspecified atrial fibrillation: Secondary | ICD-10-CM

## 2013-04-15 DIAGNOSIS — J479 Bronchiectasis, uncomplicated: Secondary | ICD-10-CM

## 2013-04-15 NOTE — Assessment & Plan Note (Addendum)
Takes Levothyroxine 50mcg daily, recent TSH normal.    

## 2013-04-15 NOTE — Assessment & Plan Note (Signed)
Hgb 8s, takes Ferrous Sulfate daily.

## 2013-04-15 NOTE — Assessment & Plan Note (Signed)
Presently taking Namenda and able to converse w/o behavioral problems.                                   

## 2013-04-15 NOTE — Assessment & Plan Note (Addendum)
Serum Na 125 upon hospital discharge. SIADH.

## 2013-04-15 NOTE — Assessment & Plan Note (Signed)
Not apparent, stopped taking Lasix upon discharge, monitor wt and resume as indicated.      

## 2013-04-15 NOTE — Assessment & Plan Note (Signed)
PNA--4-6 weeks of ABTs--Levaquin and Clindamycin IV via PICC right arm then transition to PO Levaquin and Clindamycin. Observe the patient.    

## 2013-04-15 NOTE — Progress Notes (Signed)
Patient ID: [name], male   DOB: 04-01-1924, 79 y.o.   MRN: [id number] Code Status: DNR  Allergies  Allergen Reactions  . Sulfonamide Derivatives Other (See Comments)    unknown  . Penicillins Rash    Chief Complaint  Patient presents with  . Hospitalization Follow-up    HPI: Patient is a 79 y.o. male seen in the SNF at [facility] today for f/u hospitalization 04/09/13-04/14/13. Past medical hx includes COPD with bronchiectasis, hx of Mycobacterium Avium intracellulare pneumonia, CAD, HTN. The patient presented to ER with fever and fatigue 04/02/13 started Z-Pak, then f/u with PCP Dr. Farrel Conners added. The patient continued with low grade fever and leukocytosis and subsequently admitted to hospital 04/09/13 for possible right upper lobe pneumonia. Responded well to IV Levaquin and Clindamycin while in hospital--will 2 more weeks of IV ABTs and may be 2 more weeks of oral ABT while in SNF Problem List Items Addressed This Visit   BRONCHIECTASIS W/O ACUTE EXACERBATION - Primary     PNA--4-6 weeks of ABTs--Levaquin and Clindamycin IV via PICC right arm then transition to PO Levaquin and Clindamycin. Observe the patient.     Edema     Not apparent, stopped taking Lasix upon discharge, monitor wt and resume as indicated.     A-fib     Rate controlled on Diltiazem 180mg     Hyponatremia     Serum Na 125 upon hospital discharge. SIADH.    Anemia     Hgb 8s, takes Ferrous Sulfate daily.     Unspecified hypothyroidism     Takes Levothyroxine daily, recent TSH normal.        Review of Systems:  Review of Systems  Constitutional: Positive for weight loss and malaise/fatigue. Negative for fever, chills and diaphoresis.  HENT: Positive for hearing loss. Negative for ear pain, congestion, sore throat, neck pain and ear discharge.   Eyes: Negative for blurred vision, double vision, photophobia, pain, discharge and redness.  Respiratory: Positive for cough. Negative  for hemoptysis, sputum production, shortness of breath and wheezing.   Cardiovascular: Positive for PND. Negative for chest pain, palpitations, orthopnea, claudication and leg swelling.  Gastrointestinal: Negative for heartburn, nausea, vomiting, abdominal pain, diarrhea, constipation and blood in stool.  Genitourinary: Positive for frequency. Negative for dysuria, urgency, hematuria and flank pain.  Musculoskeletal: Negative for myalgias, back pain, joint pain and falls.  Skin: Negative for itching and rash.  Neurological: Positive for weakness. Negative for dizziness, tingling, tremors, sensory change, speech change, focal weakness, seizures, loss of consciousness and headaches.  Endo/Heme/Allergies: Negative for environmental allergies and polydipsia. Does not bruise/bleed easily.  Psychiatric/Behavioral: Positive for memory loss. Negative for depression, hallucinations and substance abuse. The patient is not nervous/anxious and does not have insomnia.      Past Medical History  Diagnosis Date  . Allergic rhinitis   . 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 COPD (chronic obstructive pulmonary disease)   . Diverticula of colon   . Right leg DVT 01/2010  . IHD (ischemic heart disease)   . Hyperlipidemia   . Hypertension   . Depression   . Myalgia   . Hypothyroidism   . Bronchiectasis   . Spinal stenosis   . History of heartburn   . Joint pain   . 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 Coronary artery disease   . Pneumonia   . Arthritis   . CHF (congestive heart failure)  Past Surgical History  Procedure Laterality Date  . Cardiac catheterization  06/13/2000    normal. ef 60%  . Cardiovascular stress test  04/2010    ef 54%, and normal perfusion  . Coronary angioplasty  1991    1st diagonal  . Carpal tunnel release      right  . Rotator cuff repair      left shoulder  . Knee surgery  August  2006    S/P right knee replacement  .  Laminectomy      2005  . Cataract extraction      Bilateral    Social History:   reports that he quit smoking about 60 years ago. His smoking use included Cigarettes. He has a 25 pack-year smoking history. He has never used smokeless tobacco. He reports that he does not drink alcohol or use illicit drugs.  Family History  Problem Relation Age of Onset  . Heart disease Father   . Heart failure Father   . Breast cancer Maternal Aunt     Medications: Patient's Medications  New Prescriptions   No medications on file  Previous Medications   ACETAMINOPHEN (TYLENOL) 500 MG TABLET    Take 500 mg by mouth 2 (two) times daily. pain   ALBUTEROL (PROVENTIL) (5 MG/ML) 0.5% NEBULIZER SOLUTION    Take 0.5 mLs (2.5 mg total) by nebulization 4 (four) times daily.   ASPIRIN EC 81 MG TABLET    Take 81 mg by mouth every morning.   CLINDAMYCIN (CLEOCIN) 300 MG/50ML IVPB    Inject 50 mLs (300 mg total) into the vein every 12 (twelve) hours.   DILTIAZEM (DILACOR XR) 180 MG 24 HR CAPSULE    Take 180 mg by mouth every morning.   FAMOTIDINE (PEPCID) 20 MG TABLET    Take 20 mg by mouth at bedtime.   FEEDING SUPPLEMENT (ENSURE) PUDG    Take 1 Container by mouth 3 (three) times daily between meals.   FERROUS SULFATE 325 (65 FE) MG TABLET    Take 325 mg by mouth daily with lunch.   FLUTICASONE (FLONASE) 50 MCG/ACT NASAL SPRAY    Place 2 sprays into the nose 2 (two) times daily.   GUAIFENESIN (MUCINEX) 600 MG 12 HR TABLET    Take 1,200 mg by mouth 2 (two) times daily.   LEVOFLOXACIN (LEVAQUIN) 750 MG/150ML SOLN    Inject 150 mLs (750 mg total) into the vein daily.   LEVOTHYROXINE (SYNTHROID, LEVOTHROID) 50 MCG TABLET    Take 50 mcg by mouth every morning.    MEMANTINE HCL ER (NAMENDA XR) 28 MG CP24    Take 1 capsule by mouth every morning.   MOMETASONE-FORMOTEROL (DULERA) 100-5 MCG/ACT AERO    Inhale 2 puffs into the lungs 2 (two) times daily.   MULTIPLE VITAMINS-MINERALS (PRESERVISION AREDS) CAPS    Take 1  capsule by mouth daily with lunch.    NITROGLYCERIN (NITROSTAT) 0.4 MG SL TABLET    Place 0.4 mg under the tongue every 5 (five) minutes as needed. May repeat x3  Modified Medications   No medications on file  Discontinued Medications   No medications on file     Physical Exam: Physical Exam  Constitutional: He is oriented to person, place, and time. He appears well-developed and well-nourished. No distress.  HENT:  Head: Normocephalic and atraumatic.  Right Ear: External ear normal.  Nose: Nose normal.  Mouth/Throat: Oropharynx is clear and moist. No oropharyngeal exudate.  Eyes: Conjunctivae and EOM are normal.  Pupils are equal, round, and reactive to light. Right eye exhibits no discharge. Left eye exhibits no discharge. No scleral icterus.  Neck: Normal range of motion. Neck supple. No JVD present. No tracheal deviation present. No thyromegaly present.  Cardiovascular: Normal rate.  An irregular rhythm present. Exam reveals no gallop and no friction rub.   No murmur heard. Pulmonary/Chest: Effort normal. He has decreased breath sounds. He has no wheezes. He has no rales. He exhibits no tenderness.  Abdominal: Soft. Bowel sounds are normal. He exhibits no distension. There is no tenderness. There is no rebound.  Musculoskeletal: Normal range of motion. He exhibits no edema and no tenderness.  Lymphadenopathy:    He has no cervical adenopathy.  Neurological: He is alert and oriented to person, place, and time. He has normal reflexes. He displays normal reflexes. No cranial nerve deficit. He exhibits normal muscle tone. Coordination normal.  Skin: Skin is warm and dry. No rash noted. He is not diaphoretic. No erythema. No pallor.  Right upper arm PICC  Psychiatric: He has a normal mood and affect. His behavior is normal. Judgment and thought content normal. Cognition and memory are impaired. He exhibits abnormal recent memory and abnormal remote memory.    Filed Vitals:   04/15/13  1443  BP: 126/70  Pulse: 74  Temp: 97.1 F (36.2 C)  TempSrc: Tympanic  Resp: 18      Labs reviewed: Basic Metabolic Panel:  Recent Labs  16/10/96 1045 07/04/12 1915 07/05/12 0230  08/09/12 0510  04/09/13 1652  04/10/13 0440 04/11/13 0449 04/13/13 0422 04/14/13 0600  NA 133* 129* 132*  < > 119*  < >  --   < > 135 132* 125* 125*  K 4.3 5.3* 3.3*  < > 4.0  < >  --   < > 3.4* 3.8 4.2 4.3  CL 98 96 101  < > 88*  < >  --   < > 101 101 94* 94*  CO2 26 24 21   < > 21  < >  --   --  24 23 23 23   GLUCOSE 81 90 94  < > 98  < >  --   < > 103* 94 98 92  BUN 23 20 19   < > 12  < >  --   < > 16 12 11 10   CREATININE 1.1 1.19 1.15  < > 1.05  < >  --   < > 1.17 1.13 1.08 1.00  CALCIUM 9.1 9.2 7.8*  < > 7.8*  < >  --   --  8.1* 8.4 8.4 8.4  MG  --  2.1  --   --   --   --  2.0  --   --   --   --   --   PHOS  --  2.7  --   --   --   --  3.0  --   --   --   --   --   TSH 3.46  --  2.652  --  2.408  --   --   --  2.880  --   --   --   < > = values in this interval not displayed. Liver Function Tests:  Recent Labs  04/02/13 2235 04/10/13 0440 04/13/13 0422  AST 14 12 13   ALT 8 6 6   ALKPHOS 57 49 53  BILITOT 0.4 0.3 0.2*  PROT 7.5 6.2 6.6  ALBUMIN 3.1* 2.2* 2.3*  No results found for this basename: LIPASE, AMYLASE,  in the last 8760 hours No results found for this basename: AMMONIA,  in the last 8760 hours CBC:  Recent Labs  08/02/12 1215  04/02/13 2235 04/09/13 1652  04/11/13 0449 04/13/13 0422 04/14/13 0600  WBC 12.1*  < > 9.5 7.4  < > 5.9 5.8 6.3  NEUTROABS 9.4*  --  6.3 5.1  --   --   --   --   HGB 11.0*  < > 10.0* 9.8*  < > 8.4* 8.4* 8.6*  HCT 30.6*  < > 29.7* 28.0*  < > 25.0* 23.9* 24.0*  MCV 92.4  < > 98.0 97.6  < > 100.4* 98.0 95.6  PLT 243  < > 202 264  < > 269 328 381  < > = values in this interval not displayed. Lipid Panel:  Recent Labs  07/06/12 0415  CHOL 151  HDL 44  LDLCALC 93  TRIG 70  CHOLHDL 3.4     Recent Labs  04/10/13 0549  04/13/13 0422  FOLATE >20.0 12.9  IRON 38* 31*  VITAMINB12 496 452    Past Procedures:  04/09/13 CT head  IMPRESSION:  1. No acute intracranial abnormality.  2. Generalized atrophy and chronic microvascular ischemic changes  of the white matter, progressive since 2008 but unchanged since  last year.  3. Chronic left maxillary sinusitis.    04/10/13 CT chest  IMPRESSION:  1. Pneumonia involving the right upper lobe with associated lung  abscess, accounting for the cavitary lesion on recent chest  imaging. Patchy pneumonia is also present elsewhere throughout  both lungs.  2. Reactive right hilar and subcarinal mediastinal  lymphadenopathy.  3. Mild cardiomegaly without evidence of pulmonary edema.  4. Small right pleural effusion. There is also a small amount of  pleural fluid in the right upper thorax adjacent to the lung  abscess, but there is no pleural enhancement to confirm empyema.  5. Old granulomatous disease.  6. Air-fluid level in the esophagus without evidence of hiatal  hernia, GE reflux and/or esophageal dysmotility.  7. Probable sebaceous cyst in the right chest wall.  8. Spinal stenosis suspected at the C6-7 level due to a chronic  disc herniation with calcification in the posterior annular fibers.    04/14/13 CXR  R sided PICC line appears new with distal tip at superior vena cava, old granulomatous disease re demonstrated, patchy pneumonitis/inflammatory consolidate at the rigth upper lung and left mid/lower lung appears worse in the interval. Coexisting cavity at the right upper lung is re demonstrated, which now appears opacified liely related inflammation.   Assessment/Plan BRONCHIECTASIS W/O ACUTE EXACERBATION PNA--4-6 weeks of ABTs--Levaquin and Clindamycin IV via PICC right arm then transition to PO Levaquin and Clindamycin. Observe the patient.   Hyponatremia Serum Na 125 upon hospital discharge. SIADH.  Anemia Hgb 8s, takes Ferrous Sulfate  daily.   A-fib Rate controlled on Diltiazem 180mg   Unspecified hypothyroidism Takes Levothyroxine daily, recent TSH normal.   Dementia Presently taking Namenda and able to converse w/o behavioral problems.   Edema Not apparent, stopped taking Lasix upon discharge, monitor wt and resume as indicated.     Family/ Staff Communication: observe the patient.   Goals of Care: probable SNF  Labs/tests ordered: none.

## 2013-04-15 NOTE — Assessment & Plan Note (Signed)
Rate controlled on Diltiazem 180mg                     

## 2013-04-19 LAB — BASIC METABOLIC PANEL WITH GFR
BUN: 16 mg/dL (ref 4–21)
Creatinine: 1 mg/dL (ref 0.6–1.3)
Glucose: 89 mg/dL
Potassium: 4.4 mmol/L (ref 3.4–5.3)
Sodium: 123 mmol/L — AB (ref 137–147)

## 2013-04-19 LAB — CBC AND DIFFERENTIAL
Hemoglobin: 8.5 g/dL — AB (ref 13.5–17.5)
WBC: 7.1 10^3/mL

## 2013-04-21 ENCOUNTER — Non-Acute Institutional Stay (SKILLED_NURSING_FACILITY): Payer: Medicare Other | Admitting: Nurse Practitioner

## 2013-04-21 DIAGNOSIS — E871 Hypo-osmolality and hyponatremia: Secondary | ICD-10-CM

## 2013-04-21 DIAGNOSIS — R609 Edema, unspecified: Secondary | ICD-10-CM

## 2013-04-21 DIAGNOSIS — I4891 Unspecified atrial fibrillation: Secondary | ICD-10-CM

## 2013-04-21 DIAGNOSIS — E039 Hypothyroidism, unspecified: Secondary | ICD-10-CM

## 2013-04-21 DIAGNOSIS — D649 Anemia, unspecified: Secondary | ICD-10-CM

## 2013-04-21 DIAGNOSIS — J189 Pneumonia, unspecified organism: Secondary | ICD-10-CM

## 2013-04-21 NOTE — Progress Notes (Signed)
Patient ID: [name], male   DOB: 1924-08-14, 79 y.o.   MRN: [id number]  Chief Complaint:  Chief Complaint  Patient presents with  . Medical Managment of Chronic Issues    hyponatremia, anemia.      HPI:    Problem List Items Addressed This Visit   Edema     Not apparent, stopped taking Lasix upon discharge, monitor wt and resume as indicated.       A-fib     Rate controlled on Diltiazem 180mg       Pneumonia     PNA--4-6 weeks of ABTs--Levaquin and Clindamycin IV via PICC right arm then transition to PO Levaquin and Clindamycin. Observe the patient.       Hyponatremia - Primary     Serum Na 125 upon hospital discharge. SIADH. Now is 123. Not on diuretics. Will try Demeclocycline       Anemia     Hgb 8s, takes Ferrous Sulfate daily.       Unspecified hypothyroidism     Takes Levothyroxine daily, recent TSH normal.          Review of Systems:  Review of Systems  Constitutional: Positive for weight loss and malaise/fatigue. Negative for fever, chills and diaphoresis.  HENT: Positive for hearing loss. Negative for ear pain, congestion, sore throat, neck pain and ear discharge.   Eyes: Negative for blurred vision, double vision, photophobia, pain, discharge and redness.  Respiratory: Positive for cough. Negative for hemoptysis, sputum production, shortness of breath and wheezing.   Cardiovascular: Positive for PND. Negative for chest pain, palpitations, orthopnea, claudication and leg swelling.  Gastrointestinal: Negative for heartburn, nausea, vomiting, abdominal pain, diarrhea, constipation and blood in stool.  Genitourinary: Positive for frequency. Negative for dysuria, urgency, hematuria and flank pain.  Musculoskeletal: Negative for myalgias, back pain, joint pain and falls.  Skin: Negative for itching and rash.  Neurological: Positive for weakness. Negative for dizziness, tingling, tremors, sensory change, speech change, focal weakness, seizures,  loss of consciousness and headaches.  Endo/Heme/Allergies: Negative for environmental allergies and polydipsia. Does not bruise/bleed easily.  Psychiatric/Behavioral: Positive for memory loss. Negative for depression, hallucinations and substance abuse. The patient is not nervous/anxious and does not have insomnia.      Medications: Patient's Medications  New Prescriptions   No medications on file  Previous Medications   ACETAMINOPHEN (TYLENOL) 500 MG TABLET    Take 500 mg by mouth 2 (two) times daily. pain   ALBUTEROL (PROVENTIL) (5 MG/ML) 0.5% NEBULIZER SOLUTION    Take 0.5 mLs (2.5 mg total) by nebulization 4 (four) times daily.   ASPIRIN EC 81 MG TABLET    Take 81 mg by mouth every morning.   CLINDAMYCIN (CLEOCIN) 300 MG/50ML IVPB    Inject 50 mLs (300 mg total) into the vein every 12 (twelve) hours.   DILTIAZEM (DILACOR XR) 180 MG 24 HR CAPSULE    Take 180 mg by mouth every morning.   FAMOTIDINE (PEPCID) 20 MG TABLET    Take 20 mg by mouth at bedtime.   FEEDING SUPPLEMENT (ENSURE) PUDG    Take 1 Container by mouth 3 (three) times daily between meals.   FERROUS SULFATE 325 (65 FE) MG TABLET    Take 325 mg by mouth daily with lunch.   FLUTICASONE (FLONASE) 50 MCG/ACT NASAL SPRAY    Place 2 sprays into the nose 2 (two) times daily.   GUAIFENESIN (MUCINEX) 600 MG 12 HR TABLET    Take 1,200 mg by  mouth 2 (two) times daily.   LEVOFLOXACIN (LEVAQUIN) 750 MG/150ML SOLN    Inject 150 mLs (750 mg total) into the vein daily.   LEVOTHYROXINE (SYNTHROID, LEVOTHROID) 50 MCG TABLET    Take 50 mcg by mouth every morning.    MEMANTINE HCL ER (NAMENDA XR) 28 MG CP24    Take 1 capsule by mouth every morning.   MOMETASONE-FORMOTEROL (DULERA) 100-5 MCG/ACT AERO    Inhale 2 puffs into the lungs 2 (two) times daily.   MULTIPLE VITAMINS-MINERALS (PRESERVISION AREDS) CAPS    Take 1 capsule by mouth daily with lunch.    NITROGLYCERIN (NITROSTAT) 0.4 MG SL TABLET    Place 0.4 mg under the tongue every 5 (five)  minutes as needed. May repeat x3  Modified Medications   No medications on file  Discontinued Medications   No medications on file     Physical Exam: Physical Exam  Constitutional: He is oriented to person, place, and time. He appears well-developed and well-nourished. No distress.  HENT:  Head: Normocephalic and atraumatic.  Right Ear: External ear normal.  Nose: Nose normal.  Mouth/Throat: Oropharynx is clear and moist. No oropharyngeal exudate.  Eyes: Conjunctivae and EOM are normal. Pupils are equal, round, and reactive to light. Right eye exhibits no discharge. Left eye exhibits no discharge. No scleral icterus.  Neck: Normal range of motion. Neck supple. No JVD present. No tracheal deviation present. No thyromegaly present.  Cardiovascular: Normal rate.  An irregular rhythm present. Exam reveals no gallop and no friction rub.   No murmur heard. Pulmonary/Chest: Effort normal. He has decreased breath sounds. He has no wheezes. He has no rales. He exhibits no tenderness.  Abdominal: Soft. Bowel sounds are normal. He exhibits no distension. There is no tenderness. There is no rebound.  Musculoskeletal: Normal range of motion. He exhibits no edema and no tenderness.  Lymphadenopathy:    He has no cervical adenopathy.  Neurological: He is alert and oriented to person, place, and time. He has normal reflexes. He displays normal reflexes. No cranial nerve deficit. He exhibits normal muscle tone. Coordination normal.  Skin: Skin is warm and dry. No rash noted. He is not diaphoretic. No erythema. No pallor.  Right upper arm PICC  Psychiatric: He has a normal mood and affect. His behavior is normal. Judgment and thought content normal. Cognition and memory are impaired. He exhibits abnormal recent memory and abnormal remote memory.     Filed Vitals:   04/21/13 1136  BP: 100/50  Pulse: 66  Temp: 97 F (36.1 C)  TempSrc: Tympanic  Resp: 18      Labs reviewed: Basic Metabolic  Panel:  Recent Labs  05/15/12 1045 07/04/12 1915 07/05/12 0230  08/09/12 0510  04/09/13 1652  04/10/13 0440 04/11/13 0449 04/13/13 0422 04/14/13 0600 04/19/13  NA 133* 129* 132*  < > 119*  < >  --   < > 135 132* 125* 125* 123*  K 4.3 5.3* 3.3*  < > 4.0  < >  --   < > 3.4* 3.8 4.2 4.3 4.4  CL 98 96 101  < > 88*  < >  --   < > 101 101 94* 94*  --   CO2 26 24 21   < > 21  < >  --   --  24 23 23 23   --   GLUCOSE 81 90 94  < > 98  < >  --   < > 103* 94 98 92  --  BUN 23 20 19   < > 12  < >  --   < > 16 12 11 10 16   CREATININE 1.1 1.19 1.15  < > 1.05  < >  --   < > 1.17 1.13 1.08 1.00 1.0  CALCIUM 9.1 9.2 7.8*  < > 7.8*  < >  --   --  8.1* 8.4 8.4 8.4  --   MG  --  2.1  --   --   --   --  2.0  --   --   --   --   --   --   PHOS  --  2.7  --   --   --   --  3.0  --   --   --   --   --   --   TSH 3.46  --  2.652  --  2.408  --   --   --  2.880  --   --   --   --   < > = values in this interval not displayed.  Liver Function Tests:  Recent Labs  04/02/13 2235 04/10/13 0440 04/13/13 0422  AST 14 12 13   ALT 8 6 6   ALKPHOS 57 49 53  BILITOT 0.4 0.3 0.2*  PROT 7.5 6.2 6.6  ALBUMIN 3.1* 2.2* 2.3*    CBC:  Recent Labs  08/02/12 1215  04/02/13 2235 04/09/13 1652  04/11/13 0449 04/13/13 0422 04/14/13 0600 04/19/13  WBC 12.1*  < > 9.5 7.4  < > 5.9 5.8 6.3 7.1  NEUTROABS 9.4*  --  6.3 5.1  --   --   --   --   --   HGB 11.0*  < > 10.0* 9.8*  < > 8.4* 8.4* 8.6* 8.5*  HCT 30.6*  < > 29.7* 28.0*  < > 25.0* 23.9* 24.0* 24*  MCV 92.4  < > 98.0 97.6  < > 100.4* 98.0 95.6  --   PLT 243  < > 202 264  < > 269 328 381 367  < > = values in this interval not displayed.  Anemia Panel:  Recent Labs  04/10/13 0549 04/13/13 0422  IRON 38* 31*  FOLATE >20.0 12.9  VITAMINB12 496 452    Significant Diagnostic Results:     Assessment/Plan Hyponatremia Serum Na 125 upon hospital discharge. SIADH. Now is 123. Not on diuretics. Will try Demeclocycline     Unspecified  hypothyroidism Takes Levothyroxine daily, recent TSH normal.     A-fib Rate controlled on Diltiazem 180mg     Dementia Presently taking Namenda and able to converse w/o behavioral problems.     Anemia Hgb 8s, takes Ferrous Sulfate daily.     Pneumonia PNA--4-6 weeks of ABTs--Levaquin and Clindamycin IV via PICC right arm then transition to PO Levaquin and Clindamycin. Observe the patient.     Edema Not apparent, stopped taking Lasix upon discharge, monitor wt and resume as indicated.         Family/ staff Communication: observe the patient   Goals of care: SNF   Labs/tests ordered BMP one week.

## 2013-04-21 NOTE — Assessment & Plan Note (Signed)
Presently taking Namenda and able to converse w/o behavioral problems.                                   

## 2013-04-21 NOTE — Assessment & Plan Note (Signed)
Not apparent, stopped taking Lasix upon discharge, monitor wt and resume as indicated.

## 2013-04-21 NOTE — Assessment & Plan Note (Signed)
Rate controlled on Diltiazem 180mg                     

## 2013-04-21 NOTE — Assessment & Plan Note (Signed)
Hgb 8s, takes Ferrous Sulfate daily.  

## 2013-04-21 NOTE — Assessment & Plan Note (Signed)
Serum Na 125 upon hospital discharge. SIADH. Now is 123. Not on diuretics. Will try Demeclocycline

## 2013-04-21 NOTE — Assessment & Plan Note (Signed)
Takes Levothyroxine daily, recent TSH normal.

## 2013-04-21 NOTE — Assessment & Plan Note (Signed)
PNA--4-6 weeks of ABTs--Levaquin and Clindamycin IV via PICC right arm then transition to PO Levaquin and Clindamycin. Observe the patient.

## 2013-04-27 ENCOUNTER — Encounter: Payer: Self-pay | Admitting: *Deleted

## 2013-05-04 ENCOUNTER — Inpatient Hospital Stay: Payer: Medicare Other | Admitting: Internal Medicine

## 2013-05-19 ENCOUNTER — Non-Acute Institutional Stay (SKILLED_NURSING_FACILITY): Payer: Medicare Other | Admitting: Nurse Practitioner

## 2013-05-19 DIAGNOSIS — I4891 Unspecified atrial fibrillation: Secondary | ICD-10-CM

## 2013-05-19 DIAGNOSIS — J189 Pneumonia, unspecified organism: Secondary | ICD-10-CM

## 2013-05-19 DIAGNOSIS — E039 Hypothyroidism, unspecified: Secondary | ICD-10-CM

## 2013-05-19 DIAGNOSIS — J449 Chronic obstructive pulmonary disease, unspecified: Secondary | ICD-10-CM

## 2013-05-19 DIAGNOSIS — D649 Anemia, unspecified: Secondary | ICD-10-CM

## 2013-05-19 DIAGNOSIS — E871 Hypo-osmolality and hyponatremia: Secondary | ICD-10-CM

## 2013-05-20 ENCOUNTER — Encounter: Payer: Self-pay | Admitting: Nurse Practitioner

## 2013-05-20 NOTE — Assessment & Plan Note (Addendum)
PNA--4-6 weeks of ABTs--Completed--PICC right arm removed without difficulty--clinically healed.

## 2013-05-20 NOTE — Assessment & Plan Note (Signed)
Hgb 8s, takes Ferrous Sulfate daily, update CBC

## 2013-05-20 NOTE — Assessment & Plan Note (Signed)
Serum Na 125 upon hospital discharge. SIADH. Now is 123. Not on diuretics. Demeclocycline since 04/21/13, f/u BMP

## 2013-05-20 NOTE — Assessment & Plan Note (Signed)
Takes Levothyroxine daily,TSH 3.715 04/27/13

## 2013-05-20 NOTE — Progress Notes (Signed)
Patient ID: [name], male   DOB: [birth year], 79 y.o.   MRN: [id number]  Chief Complaint:  Chief Complaint  Patient presents with  . Medical Managment of Chronic Issues    PICC removal.      HPI:   Problem List Items Addressed This Visit   A-fib     Rate controlled on Diltiazem 180mg         Anemia     Hgb 8s, takes Ferrous Sulfate daily, update CBC        COPD     Maintained on Dulera and Mucinex.     Hyponatremia (Chronic)     Serum Na 125 upon hospital discharge. SIADH. Now is 123. Not on diuretics. Demeclocycline since 04/21/13, f/u BMP    Pneumonia - Primary (Chronic)     PNA--4-6 weeks of ABTs--Completed--PICC right arm removed without difficulty--clinically healed.             Unspecified hypothyroidism     Takes Levothyroxine daily,TSH 3.715 04/27/13         Review of Systems:  Review of Systems  Constitutional: Positive for weight loss. Negative for fever, chills and diaphoresis.  HENT: Positive for hearing loss. Negative for ear pain, congestion, sore throat, neck pain and ear discharge.   Eyes: Negative for blurred vision, double vision, photophobia, pain, discharge and redness.  Respiratory: Positive for cough. Negative for hemoptysis, sputum production, shortness of breath and wheezing.   Cardiovascular: Positive for PND. Negative for chest pain, palpitations, orthopnea, claudication and leg swelling.  Gastrointestinal: Negative for heartburn, nausea, vomiting, abdominal pain, diarrhea, constipation and blood in stool.  Genitourinary: Positive for frequency. Negative for dysuria, urgency, hematuria and flank pain.  Musculoskeletal: Negative for myalgias, back pain, joint pain and falls.  Skin: Negative for itching and rash.  Neurological: Positive for weakness. Negative for dizziness, tingling, tremors, sensory change, speech change, focal weakness, seizures, loss of consciousness and headaches.  Endo/Heme/Allergies: Negative for  environmental allergies and polydipsia. Does not bruise/bleed easily.  Psychiatric/Behavioral: Positive for memory loss. Negative for depression, hallucinations and substance abuse. The patient is not nervous/anxious and does not have insomnia.      Medications: Patient's Medications  New Prescriptions   No medications on file  Previous Medications   ACETAMINOPHEN (TYLENOL) 500 MG TABLET    Take 500 mg by mouth 2 (two) times daily. pain   ALBUTEROL (PROVENTIL) (5 MG/ML) 0.5% NEBULIZER SOLUTION    Take 0.5 mLs (2.5 mg total) by nebulization 4 (four) times daily.   ASPIRIN EC 81 MG TABLET    Take 81 mg by mouth every morning. Take 1 tablet daily to prevent heart attack and stroke.   CLINDAMYCIN (CLEOCIN) 300 MG/50ML IVPB    Inject 50 mLs (300 mg total) into the vein every 12 (twelve) hours.   DILTIAZEM (DILACOR XR) 180 MG 24 HR CAPSULE    Take 180 mg by mouth every morning.   DILTIAZEM (TIAZAC) 300 MG 24 HR CAPSULE    Take 300 mg by mouth daily. Take 1 tablet daily   FAMOTIDINE (PEPCID) 20 MG TABLET    Take 20 mg by mouth at bedtime. Take 1 tablet once daily.   FEEDING SUPPLEMENT (ENSURE) PUDG    Take 1 Container by mouth 3 (three) times daily between meals.   FERROUS SULFATE 325 (65 FE) MG TABLET    Take 325 mg by mouth daily with lunch.   FLUTICASONE (FLONASE) 50 MCG/ACT NASAL SPRAY    Place 2 sprays  into the nose 2 (two) times daily. Two sprays in each nostril, 2 times daily.   FLUTICASONE-SALMETEROL (ADVAIR) 250-50 MCG/DOSE AEPB    Inhale 1 puff into the lungs every 12 (twelve) hours. Take 1 puff twice daily for COPD.   GUAIFENESIN (MUCINEX) 600 MG 12 HR TABLET    Take 1,200 mg by mouth 2 (two) times daily.   LEVOFLOXACIN (LEVAQUIN) 750 MG/150ML SOLN    Inject 150 mLs (750 mg total) into the vein daily.   LEVOTHYROXINE (SYNTHROID, LEVOTHROID) 50 MCG TABLET    Take 50 mcg by mouth every morning. Take 1 tablet once daily for thyroid.   MEMANTINE HCL ER (NAMENDA XR) 28 MG CP24    Take 1  capsule by mouth every morning.   MOMETASONE-FORMOTEROL (DULERA) 100-5 MCG/ACT AERO    Inhale 2 puffs into the lungs 2 (two) times daily.   MULTIPLE VITAMINS-MINERALS (PRESERVISION AREDS) CAPS    Take 1 capsule by mouth daily with lunch. Take 1 tablet once daily in the evening.   NITROGLYCERIN (NITROSTAT) 0.4 MG SL TABLET    Place 0.4 mg under the tongue every 5 (five) minutes as needed. Take 1 tablet sublingual at onset of chest pain, repeat in 5 minutes, May repeat x 3 in 15 minutes.  Modified Medications   No medications on file  Discontinued Medications   No medications on file     Physical Exam: Physical Exam  Constitutional: He is oriented to person, place, and time. He appears well-developed and well-nourished. No distress.  HENT:  Head: Normocephalic and atraumatic.  Right Ear: External ear normal.  Nose: Nose normal.  Mouth/Throat: Oropharynx is clear and moist. No oropharyngeal exudate.  Eyes: Conjunctivae and EOM are normal. Pupils are equal, round, and reactive to light. Right eye exhibits no discharge. Left eye exhibits no discharge. No scleral icterus.  Neck: Normal range of motion. Neck supple. No JVD present. No tracheal deviation present. No thyromegaly present.  Cardiovascular: Normal rate.  An irregular rhythm present. Exam reveals no gallop and no friction rub.   No murmur heard. Pulmonary/Chest: Effort normal. He has decreased breath sounds. He has no wheezes. He has no rales. He exhibits no tenderness.  Abdominal: Soft. Bowel sounds are normal. He exhibits no distension. There is no tenderness. There is no rebound.  Musculoskeletal: Normal range of motion. He exhibits no edema and no tenderness.  Lymphadenopathy:    He has no cervical adenopathy.  Neurological: He is alert and oriented to person, place, and time. He has normal reflexes. He displays normal reflexes. No cranial nerve deficit. He exhibits normal muscle tone. Coordination normal.  Skin: Skin is warm and  dry. No rash noted. He is not diaphoretic. No erythema. No pallor.  Right upper arm PICC removed   Psychiatric: He has a normal mood and affect. His behavior is normal. Judgment and thought content normal. Cognition and memory are impaired. He exhibits abnormal recent memory and abnormal remote memory.     Filed Vitals:   05/20/13 1005  BP: 112/68  Pulse: 74  Temp: 97.5 F (36.4 C)  TempSrc: Tympanic  Resp: 18      Labs reviewed: Basic Metabolic Panel:  Recent Labs  95/62/13 1915  08/09/12 0510  04/09/13 1652  04/10/13 0440 04/11/13 0449 04/13/13 0422 04/14/13 0600 04/19/13 04/27/13  NA 129*  < > 119*  < >  --   < > 135 132* 125* 125* 123*  --   K 5.3*  < > 4.0  < >  --   < >  3.4* 3.8 4.2 4.3 4.4  --   CL 96  < > 88*  < >  --   < > 101 101 94* 94*  --   --   CO2 24  < > 21  < >  --   --  24 23 23 23   --   --   GLUCOSE 90  < > 98  < >  --   < > 103* 94 98 92  --   --   BUN 20  < > 12  < >  --   < > 16 12 11 10 16   --   CREATININE 1.19  < > 1.05  < >  --   < > 1.17 1.13 1.08 1.00 1.0  --   CALCIUM 9.2  < > 7.8*  < >  --   --  8.1* 8.4 8.4 8.4  --   --   MG 2.1  --   --   --  2.0  --   --   --   --   --   --   --   PHOS 2.7  --   --   --  3.0  --   --   --   --   --   --   --   TSH  --   < > 2.408  --   --   --  2.880  --   --   --   --  3.72  < > = values in this interval not displayed.  Liver Function Tests:  Recent Labs  04/02/13 2235 04/10/13 0440 04/13/13 0422  AST 14 12 13   ALT 8 6 6   ALKPHOS 57 49 53  BILITOT 0.4 0.3 0.2*  PROT 7.5 6.2 6.6  ALBUMIN 3.1* 2.2* 2.3*    CBC:  Recent Labs  08/02/12 1215  04/02/13 2235 04/09/13 1652  04/11/13 0449 04/13/13 0422 04/14/13 0600 04/19/13  WBC 12.1*  < > 9.5 7.4  < > 5.9 5.8 6.3 7.1  NEUTROABS 9.4*  --  6.3 5.1  --   --   --   --   --   HGB 11.0*  < > 10.0* 9.8*  < > 8.4* 8.4* 8.6* 8.5*  HCT 30.6*  < > 29.7* 28.0*  < > 25.0* 23.9* 24.0* 24*  MCV 92.4  < > 98.0 97.6  < > 100.4* 98.0 95.6  --   PLT 243   < > 202 264  < > 269 328 381 367  < > = values in this interval not displayed.  Anemia Panel:  Recent Labs  04/10/13 0549 04/13/13 0422  IRON 38* 31*  FOLATE >20.0 12.9  VITAMINB12 496 452    Significant Diagnostic Results:     Assessment/Plan Pneumonia PNA--4-6 weeks of ABTs--Completed--PICC right arm removed without difficulty--clinically healed.           Hyponatremia Serum Na 125 upon hospital discharge. SIADH. Now is 123. Not on diuretics. Demeclocycline since 04/21/13, f/u BMP  COPD Maintained on Dulera and Mucinex.   Unspecified hypothyroidism Takes Levothyroxine daily,TSH 3.715 04/27/13    A-fib Rate controlled on Diltiazem 180mg       Anemia Hgb 8s, takes Ferrous Sulfate daily, update CBC      Dementia Presently taking Namenda and able to converse w/o behavioral problems.         Family/ staff Communication: observe the patient.    Goals of care: SNF--possible home  with his wife.    Labs/tests ordered CBC and BMP   Total time >11mins,  >50% care coordination for PICC and plan of care.

## 2013-05-20 NOTE — Assessment & Plan Note (Signed)
Presently taking Namenda and able to converse w/o behavioral problems.                                   

## 2013-05-20 NOTE — Assessment & Plan Note (Signed)
Maintained on Dulera and Mucinex.

## 2013-05-20 NOTE — Assessment & Plan Note (Signed)
Rate controlled on Diltiazem 180mg                     

## 2013-05-25 LAB — CBC AND DIFFERENTIAL
HCT: 95 % — AB (ref 41–53)
Hemoglobin: 8.5 g/dL — AB (ref 13.5–17.5)

## 2013-05-25 LAB — BASIC METABOLIC PANEL
BUN: 17 mg/dL (ref 4–21)
Glucose: 76 mg/dL
Potassium: 4.1 mmol/L (ref 3.4–5.3)
Sodium: 129 mmol/L — AB (ref 137–147)

## 2013-06-07 ENCOUNTER — Non-Acute Institutional Stay (SKILLED_NURSING_FACILITY): Payer: Medicare Other | Admitting: Nurse Practitioner

## 2013-06-07 ENCOUNTER — Encounter: Payer: Self-pay | Admitting: Nurse Practitioner

## 2013-06-07 DIAGNOSIS — E871 Hypo-osmolality and hyponatremia: Secondary | ICD-10-CM

## 2013-06-07 DIAGNOSIS — E039 Hypothyroidism, unspecified: Secondary | ICD-10-CM

## 2013-06-07 DIAGNOSIS — F0390 Unspecified dementia without behavioral disturbance: Secondary | ICD-10-CM

## 2013-06-07 DIAGNOSIS — R2681 Unsteadiness on feet: Secondary | ICD-10-CM

## 2013-06-07 DIAGNOSIS — I10 Essential (primary) hypertension: Secondary | ICD-10-CM

## 2013-06-07 DIAGNOSIS — L02233 Carbuncle of chest wall: Secondary | ICD-10-CM | POA: Insufficient documentation

## 2013-06-07 DIAGNOSIS — R609 Edema, unspecified: Secondary | ICD-10-CM

## 2013-06-07 DIAGNOSIS — R269 Unspecified abnormalities of gait and mobility: Secondary | ICD-10-CM

## 2013-06-07 DIAGNOSIS — D649 Anemia, unspecified: Secondary | ICD-10-CM

## 2013-06-07 NOTE — Progress Notes (Signed)
Patient ID: [name], male   DOB: [birth year], 79 y.o.   MRN: [id number]  Location of service [facility]  Code Status: DNR  Allergies  Allergen Reactions  . Sulfonamide Derivatives Other (See Comments)    unknown  . Penicillins Rash    Chief Complaint  Patient presents with  . Medical Managment of Chronic Issues    upper chest carbuncles, low blood pressure.     HPI: Patient is a 79 y.o. male seen in the SNF at [facility] today for evaluation of the carbuncle @ upper chest wall and other chronic medical conditions.  Problem List Items Addressed This Visit   Anemia     Hgb 8.5 05/25/13 and was 8.5 04/19/13    Carbuncle, chest wall - Primary     Upper chest with 3 heads--lanced with scalple  and drained--irrigated with NS--apply ABT ointment and covered with gauze-will apply Bactroban bid to the wound until healed and oral Doxycycline 100mg  bid for 7 days.     Dementia     Presently taking Namenda and able to converse w/o behavioral problems.         Edema     Not apparent, stopped taking Lasix upon discharge, monitor wt and resume as indicated.         Gait instability     W/c for mobility and x2 person assist for transfer.     HTN (hypertension)     Some low measurements 100s/50s--the patient is asymptomatic, only takes Cardizem 180mg  for A-fib--may have to decrease it if the patient becomes symptomatic-continue to observe.     Hyponatremia (Chronic)     Stable on Demeclocycline, last Na 129 05/25/13    Unspecified hypothyroidism     Takes Levothyroxine daily,TSH 3.715 04/27/13           Review of Systems:  Review of Systems  Constitutional: Positive for weight loss. Negative for fever, chills and diaphoresis.  HENT: Positive for hearing loss. Negative for ear pain, congestion, sore throat, neck pain and ear discharge.   Eyes: Negative for blurred vision, double vision, photophobia, pain, discharge and redness.  Respiratory: Positive for cough.  Negative for hemoptysis, sputum production, shortness of breath and wheezing.   Cardiovascular: Positive for PND. Negative for chest pain, palpitations, orthopnea, claudication and leg swelling.  Gastrointestinal: Negative for heartburn, nausea, vomiting, abdominal pain, diarrhea, constipation and blood in stool.  Genitourinary: Positive for frequency. Negative for dysuria, urgency, hematuria and flank pain.  Musculoskeletal: Negative for myalgias, back pain, joint pain and falls.  Skin: Negative for itching and rash.       Upper chest wall carbuncle with 3 heads.   Neurological: Positive for weakness. Negative for dizziness, tingling, tremors, sensory change, speech change, focal weakness, seizures, loss of consciousness and headaches.  Endo/Heme/Allergies: Negative for environmental allergies and polydipsia. Does not bruise/bleed easily.  Psychiatric/Behavioral: Positive for memory loss. Negative for depression, hallucinations and substance abuse. The patient is not nervous/anxious and does not have insomnia.      Past Medical History  Diagnosis Date  . Allergic rhinitis   . 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 COPD (chronic obstructive pulmonary disease)   . Diverticula of colon   . Right leg DVT 01/2010  . IHD (ischemic heart disease)   . Hyperlipidemia   . Hypertension   . Depression   . Myalgia   . Hypothyroidism   . Bronchiectasis   . Spinal stenosis   . History of  heartburn   . Joint pain   . 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 Coronary artery disease   . Pneumonia   . Arthritis   . CHF (congestive heart failure)   . Impacted cerumen 12/21/2012  . Arthralgia of temporomandibular joint 12/21/2012  . Contusion of hand(s) 10/07/2012  . Personal history of fall 10/07/2012  . Other manifestations of vitamin A deficiency 08/20/2012  . Anemia, unspecified 10/.01/2012  . Essential and other specified forms of tremor 08/20/2012  . Other  emphysema 08/20/2012  . Muscle weakness (generalized) 10/.01/2012  . Kyphosis (acquired) (postural) 08/20/2012  . Abnormality of gait 08/20/2012  . Edema 08/20/2012  . Rash and other nonspecific skin eruption 10.01/2012  . Nonspecific abnormal results of pulmonary system function study 08/20/2012  . Dysphagia, unspecified(787.20) 08/20/2012  . Nonspecific abnormal results of pulmonary system function study 08/20/2012  . Candidiasis of skin and nails 08/17/2012  . Memory loss 07/13/2012  . Hyposmolality and/or hyponatremia 07/08/2012  . Hypopotassemia 07/08/2012  . Acute posthemorrhagic anemia 07/08/2012  . Major depressive disorder, single episode, unspecified 07/08/2012  . Atrial fibrillation 07/08/2012  . Hypotension, unspecified 07/08/2012  . Allergic rhinitis due to pollen 07/08/2012  . Bronchiectasis without acute exacerbation 07/08/2012  . Chronic airway obstruction, not elsewhere classified 07/08/2012  . Reflux esophagitis 07/08/2012  . Spinal stenosis, unspecified region other than cervical 07/08/2012  . Edema 07/08/2012  . Chest pain, unspecified 07/08/2012  . Transient ischemic attack (TIA), and cerebral infarction without residual deficits(V12.54) 07/08/2012   Past Surgical History  Procedure Laterality Date  . Cardiac catheterization  06/13/2000    normal. ef 60%  . Cardiovascular stress test  04/2010    ef 54%, and normal perfusion  . Coronary angioplasty  1991    1st diagonal  . Carpal tunnel release      right  . Rotator cuff repair      left shoulder  . Knee surgery  August  2006    S/P right knee replacement  . Laminectomy      2005  . Cataract extraction      Bilateral    Social History:   reports that he quit smoking about 60 years ago. His smoking use included Cigarettes. He has a 25 pack-year smoking history. He has never used smokeless tobacco. He reports that he does not drink alcohol or use illicit drugs.  Family History  Problem Relation Age of  Onset  . Heart disease Father   . Heart failure Father   . Breast cancer Maternal Aunt     Medications: Reviewed at [facility]   Physical Exam: Physical Exam  Constitutional: He is oriented to person, place, and time. He appears well-developed and well-nourished. No distress.  HENT:  Head: Normocephalic and atraumatic.  Right Ear: External ear normal.  Nose: Nose normal.  Mouth/Throat: Oropharynx is clear and moist. No oropharyngeal exudate.  Eyes: Conjunctivae and EOM are normal. Pupils are equal, round, and reactive to light. Right eye exhibits no discharge. Left eye exhibits no discharge. No scleral icterus.  Neck: Normal range of motion. Neck supple. No JVD present. No tracheal deviation present. No thyromegaly present.  Cardiovascular: Normal rate.  An irregular rhythm present. Exam reveals no gallop and no friction rub.   No murmur heard. Pulmonary/Chest: Effort normal. He has decreased breath sounds. He has no wheezes. He has no rales. He exhibits no tenderness.  Abdominal: Soft. Bowel sounds are normal. He exhibits no distension.  There is no tenderness. There is no rebound.  Musculoskeletal: Normal range of motion. He exhibits no edema and no tenderness.  Lymphadenopathy:    He has no cervical adenopathy.  Neurological: He is alert and oriented to person, place, and time. He has normal reflexes. He displays normal reflexes. No cranial nerve deficit. He exhibits normal muscle tone. Coordination normal.  Skin: Skin is warm and dry. No rash noted. He is not diaphoretic. No erythema. No pallor.  Middle and slight to the right upper chest wall carbuncle with 3 heads-lanced and drained.   Psychiatric: He has a normal mood and affect. His behavior is normal. Judgment and thought content normal. Cognition and memory are impaired. He exhibits abnormal recent memory and abnormal remote memory.    Filed Vitals:   06/07/13 1649  BP: 128/58  Pulse: 68  Temp: 98.3 F (36.8 C)  TempSrc:  Tympanic  Resp: 18      Labs reviewed: Basic Metabolic Panel:  Recent Labs  16/10/96 1915  04/09/13 1652  04/11/13 0449 04/13/13 0422 04/14/13 0600 04/19/13 04/27/13 05/25/13  NA 129*  < >  --   < > 132* 125* 125* 123*  --  129*  K 5.3*  < >  --   < > 3.8 4.2 4.3 4.4  --  4.1  CL 96  < >  --   < > 101 94* 94*  --   --   --   CO2 24  < >  --   < > 23 23 23   --   --   --   GLUCOSE 90  < >  --   < > 94 98 92  --   --   --   BUN 20  < >  --   < > 12 11 10 16   --  17  CREATININE 1.19  < >  --   < > 1.13 1.08 1.00 1.0  --  1.1  CALCIUM 9.2  < >  --   < > 8.4 8.4 8.4  --   --   --   MG 2.1  --  2.0  --   --   --   --   --   --   --   PHOS 2.7  --  3.0  --   --   --   --   --   --   --   TSH  --   < >  --   < >  --   --   --   --  3.72  --   < > = values in this interval not displayed. Liver Function Tests:  Recent Labs  04/02/13 2235 04/10/13 0440 04/13/13 0422  AST 14 12 13   ALT 8 6 6   ALKPHOS 57 49 53  BILITOT 0.4 0.3 0.2*  PROT 7.5 6.2 6.6  ALBUMIN 3.1* 2.2* 2.3*   No results found for this basename: LIPASE, AMYLASE,  in the last 8760 hours No results found for this basename: AMMONIA,  in the last 8760 hours CBC:  Recent Labs  08/02/12 1215  04/02/13 2235 04/09/13 1652  04/11/13 0449 04/13/13 0422 04/14/13 0600 04/19/13 05/25/13  WBC 12.1*  < > 9.5 7.4  < > 5.9 5.8 6.3 7.1 5.6  NEUTROABS 9.4*  --  6.3 5.1  --   --   --   --   --   --   HGB 11.0*  < > 10.0*  9.8*  < > 8.4* 8.4* 8.6* 8.5* 8.5*  HCT 30.6*  < > 29.7* 28.0*  < > 25.0* 23.9* 24.0* 24* 95*  MCV 92.4  < > 98.0 97.6  < > 100.4* 98.0 95.6  --   --   PLT 243  < > 202 264  < > 269 328 381 367 235  < > = values in this interval not displayed. Lipid Panel:  Recent Labs  07/06/12 0415  CHOL 151  HDL 44  LDLCALC 93  TRIG 70  CHOLHDL 3.4   Anemia Panel:  Recent Labs  04/10/13 0549 04/13/13 0422  FOLATE >20.0 12.9  IRON 38* 31*  VITAMINB12 496 452         Assessment/Plan Carbuncle,  chest wall Upper chest with 3 heads--lanced with scalple  and drained--irrigated with NS--apply ABT ointment and covered with gauze-will apply Bactroban bid to the wound until healed and oral Doxycycline 100mg  bid for 7 days.   Unspecified hypothyroidism Takes Levothyroxine daily,TSH 3.715 04/27/13      Dementia Presently taking Namenda and able to converse w/o behavioral problems.       HTN (hypertension) Some low measurements 100s/50s--the patient is asymptomatic, only takes Cardizem 180mg  for A-fib--may have to decrease it if the patient becomes symptomatic-continue to observe.   Edema Not apparent, stopped taking Lasix upon discharge, monitor wt and resume as indicated.       Gait instability W/c for mobility and x2 person assist for transfer.   Hyponatremia Stable on Demeclocycline, last Na 129 05/25/13  Anemia Hgb 8.5 05/25/13 and was 8.5 04/19/13    Family/ Staff Communication:  Observe the patient   Goals of Care: SNF  Labs/tests ordered: none.

## 2013-06-07 NOTE — Assessment & Plan Note (Signed)
Not apparent, stopped taking Lasix upon discharge, monitor wt and resume as indicated.

## 2013-06-07 NOTE — Assessment & Plan Note (Signed)
Presently taking Namenda and able to converse w/o behavioral problems.                                   

## 2013-06-07 NOTE — Assessment & Plan Note (Signed)
W/c for mobility and x2 person assist for transfer.

## 2013-06-07 NOTE — Assessment & Plan Note (Signed)
Takes Levothyroxine 50mcg daily,TSH 3.715 04/27/13           

## 2013-06-07 NOTE — Assessment & Plan Note (Signed)
Upper chest with 3 heads--lanced with scalple  and drained--irrigated with NS--apply ABT ointment and covered with gauze-will apply Bactroban bid to the wound until healed and oral Doxycycline 100mg  bid for 7 days.

## 2013-06-07 NOTE — Assessment & Plan Note (Signed)
Hgb 8.5 05/25/13 and was 8.5 04/19/13

## 2013-06-07 NOTE — Assessment & Plan Note (Signed)
Stable on Demeclocycline, last Na 129 05/25/13

## 2013-06-07 NOTE — Assessment & Plan Note (Signed)
Some low measurements 100s/50s--the patient is asymptomatic, only takes Cardizem 180mg for A-fib--may have to decrease it if the patient becomes symptomatic-continue to observe.        

## 2013-07-01 ENCOUNTER — Non-Acute Institutional Stay (SKILLED_NURSING_FACILITY): Payer: Medicare Other | Admitting: Nurse Practitioner

## 2013-07-01 ENCOUNTER — Encounter: Payer: Self-pay | Admitting: Nurse Practitioner

## 2013-07-01 DIAGNOSIS — I4891 Unspecified atrial fibrillation: Secondary | ICD-10-CM

## 2013-07-01 DIAGNOSIS — J449 Chronic obstructive pulmonary disease, unspecified: Secondary | ICD-10-CM

## 2013-07-01 DIAGNOSIS — D649 Anemia, unspecified: Secondary | ICD-10-CM

## 2013-07-01 DIAGNOSIS — M25569 Pain in unspecified knee: Secondary | ICD-10-CM

## 2013-07-01 DIAGNOSIS — I10 Essential (primary) hypertension: Secondary | ICD-10-CM

## 2013-07-01 DIAGNOSIS — E871 Hypo-osmolality and hyponatremia: Secondary | ICD-10-CM

## 2013-07-01 DIAGNOSIS — F0390 Unspecified dementia without behavioral disturbance: Secondary | ICD-10-CM

## 2013-07-01 DIAGNOSIS — M25562 Pain in left knee: Secondary | ICD-10-CM | POA: Insufficient documentation

## 2013-07-01 DIAGNOSIS — K219 Gastro-esophageal reflux disease without esophagitis: Secondary | ICD-10-CM

## 2013-07-01 DIAGNOSIS — L989 Disorder of the skin and subcutaneous tissue, unspecified: Secondary | ICD-10-CM

## 2013-07-01 DIAGNOSIS — E039 Hypothyroidism, unspecified: Secondary | ICD-10-CM

## 2013-07-01 DIAGNOSIS — J4489 Other specified chronic obstructive pulmonary disease: Secondary | ICD-10-CM

## 2013-07-01 NOTE — Assessment & Plan Note (Signed)
Stable on Famotidine 20mg daily.   

## 2013-07-01 NOTE — Assessment & Plan Note (Signed)
Stable on Demeclocycline, last Na 129 05/25/13 

## 2013-07-01 NOTE — Progress Notes (Signed)
Patient ID: [name], male   DOB: 03-26-1924, 79 y.o.   MRN: [id number] Code Status: DNR  Allergies  Allergen Reactions  . Sulfonamide Derivatives Other (See Comments)    unknown  . Penicillins Rash    Chief Complaint  Patient presents with  . Medical Managment of Chronic Issues    left neck skin lesion.     HPI: Patient is a 79 y.o. male seen in the SNF at [facility] today for evaluation of  Left knee pain, left neck skin lesion,  and other chronic medical conditions.  Problem List Items Addressed This Visit   A-fib     Rate controlled on Diltiazem 180mg           Anemia     Hgb 8.5 05/25/13 and was 8.5 04/19/13, takes Iron 325mg  daily.       COPD     Maintained on Advair and Mucinex.       Dementia     Presently taking Namenda and able to converse w/o behavioral problems.           GERD     Stable on Famotidine 20mg  daily.     HTN (hypertension)     Some low measurements 100s/50s--the patient is asymptomatic, only takes Cardizem 180mg  for A-fib--may have to decrease it if the patient becomes symptomatic-continue to observe.       Hyponatremia - Primary (Chronic)     Stable on Demeclocycline, last Na 129 05/25/13      Left knee pain     06/22/13 X-ray left knee: moderate to advanced OA most prominent at the medial compartment and patellofemoral joint space, small degenerative spur at the superior patella., no acute fracture, subluxation, dislocation, or lytic destructive lesion, small knee effusion. Lidoderm patch eased off his pain. W/c for mobility.     Skin lesion     Left lateral neck, opened w/o drainage, raised with mild erythema peri-wound--will apply ABT ointment daily to the area, may consider Dermatology referral is no better.     Unspecified hypothyroidism     Takes Levothyroxine daily,TSH 3.715 04/27/13             Review of Systems:  Review of Systems  Constitutional: Positive for weight loss. Negative for fever,  chills and diaphoresis.  HENT: Positive for hearing loss. Negative for ear pain, congestion, sore throat, neck pain and ear discharge.   Eyes: Negative for blurred vision, double vision, photophobia, pain, discharge and redness.  Respiratory: Positive for cough. Negative for hemoptysis, sputum production, shortness of breath and wheezing.   Cardiovascular: Positive for PND. Negative for chest pain, palpitations, orthopnea, claudication and leg swelling.  Gastrointestinal: Negative for heartburn, nausea, vomiting, abdominal pain, diarrhea, constipation and blood in stool.  Genitourinary: Positive for frequency. Negative for dysuria, urgency, hematuria and flank pain.  Musculoskeletal: Positive for joint pain (left knee pain ). Negative for myalgias, back pain and falls.  Skin: Negative for itching and rash.       Upper chest wall carbuncle with 3 heads-s/p I/D-healing nicely. New skin lesion at the left neck-opened w/o drainage, raised, peri wound erythema.   Neurological: Positive for weakness. Negative for dizziness, tingling, tremors, sensory change, speech change, focal weakness, seizures, loss of consciousness and headaches.  Endo/Heme/Allergies: Negative for environmental allergies and polydipsia. Does not bruise/bleed easily.  Psychiatric/Behavioral: Positive for memory loss. Negative for depression, hallucinations and substance abuse. The patient is not nervous/anxious and does not have insomnia.  Past Medical History  Diagnosis Date  . Allergic rhinitis   . 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 COPD (chronic obstructive pulmonary disease)   . Diverticula of colon   . Right leg DVT 01/2010  . IHD (ischemic heart disease)   . Hyperlipidemia   . Hypertension   . Depression   . Myalgia   . Hypothyroidism   . Bronchiectasis   . Spinal stenosis   . History of heartburn   . Joint pain   . 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 Coronary artery  disease   . Pneumonia   . Arthritis   . CHF (congestive heart failure)   . Impacted cerumen 12/21/2012  . Arthralgia of temporomandibular joint 12/21/2012  . Contusion of hand(s) 10/07/2012  . Personal history of fall 10/07/2012  . Other manifestations of vitamin A deficiency 08/20/2012  . Anemia, unspecified 10/.01/2012  . Essential and other specified forms of tremor 08/20/2012  . Other emphysema 08/20/2012  . Muscle weakness (generalized) 10/.01/2012  . Kyphosis (acquired) (postural) 08/20/2012  . Abnormality of gait 08/20/2012  . Edema 08/20/2012  . Rash and other nonspecific skin eruption 10.01/2012  . Nonspecific abnormal results of pulmonary system function study 08/20/2012  . Dysphagia, unspecified(787.20) 08/20/2012  . Nonspecific abnormal results of pulmonary system function study 08/20/2012  . Candidiasis of skin and nails 08/17/2012  . Memory loss 07/13/2012  . Hyposmolality and/or hyponatremia 07/08/2012  . Hypopotassemia 07/08/2012  . Acute posthemorrhagic anemia 07/08/2012  . Major depressive disorder, single episode, unspecified 07/08/2012  . Atrial fibrillation 07/08/2012  . Hypotension, unspecified 07/08/2012  . Allergic rhinitis due to pollen 07/08/2012  . Bronchiectasis without acute exacerbation 07/08/2012  . Chronic airway obstruction, not elsewhere classified 07/08/2012  . Reflux esophagitis 07/08/2012  . Spinal stenosis, unspecified region other than cervical 07/08/2012  . Edema 07/08/2012  . Chest pain, unspecified 07/08/2012  . Transient ischemic attack (TIA), and cerebral infarction without residual deficits(V12.54) 07/08/2012   Past Surgical History  Procedure Laterality Date  . Cardiac catheterization  06/13/2000    normal. ef 60%  . Cardiovascular stress test  04/2010    ef 54%, and normal perfusion  . Coronary angioplasty  1991    1st diagonal  . Carpal tunnel release      right  . Rotator cuff repair      left shoulder  . Knee surgery  August   2006    S/P right knee replacement  . Laminectomy      2005  . Cataract extraction      Bilateral    Social History:   reports that he quit smoking about 60 years ago. His smoking use included Cigarettes. He has a 25 pack-year smoking history. He has never used smokeless tobacco. He reports that he does not drink alcohol or use illicit drugs.  Family History  Problem Relation Age of Onset  . Heart disease Father   . Heart failure Father   . Breast cancer Maternal Aunt     Medications: Patient's Medications  New Prescriptions   No medications on file  Previous Medications   ACETAMINOPHEN (TYLENOL) 500 MG TABLET    Take 500 mg by mouth 2 (two) times daily. pain   ALBUTEROL (PROVENTIL) (5 MG/ML) 0.5% NEBULIZER SOLUTION    Take 0.5 mLs (2.5 mg total) by nebulization 4 (four) times daily.   ASPIRIN EC 81 MG TABLET    Take 81 mg by  mouth every morning. Take 1 tablet daily to prevent heart attack and stroke.   DEMECLOCYCLINE (DECLOMYCIN) 300 MG TABLET    Take 300 mg by mouth 2 (two) times daily.   DILTIAZEM (DILACOR XR) 180 MG 24 HR CAPSULE    Take 180 mg by mouth every morning.   FAMOTIDINE (PEPCID) 20 MG TABLET    Take 20 mg by mouth at bedtime. Take 1 tablet once daily.   FEEDING SUPPLEMENT (ENSURE) PUDG    Take 1 Container by mouth 3 (three) times daily between meals.   FERROUS SULFATE 325 (65 FE) MG TABLET    Take 325 mg by mouth daily with lunch.   FLUTICASONE (FLONASE) 50 MCG/ACT NASAL SPRAY    Place 2 sprays into the nose 2 (two) times daily. Two sprays in each nostril, 2 times daily.   FLUTICASONE-SALMETEROL (ADVAIR) 250-50 MCG/DOSE AEPB    Inhale 1 puff into the lungs every 12 (twelve) hours. Take 1 puff twice daily for COPD.   GUAIFENESIN (MUCINEX) 600 MG 12 HR TABLET    Take 1,200 mg by mouth 2 (two) times daily.   LEVOTHYROXINE (SYNTHROID, LEVOTHROID) 50 MCG TABLET    Take 50 mcg by mouth every morning. Take 1 tablet once daily for thyroid.   MEMANTINE HCL ER (NAMENDA XR) 28  MG CP24    Take 1 capsule by mouth every morning.   MOMETASONE-FORMOTEROL (DULERA) 100-5 MCG/ACT AERO    Inhale 2 puffs into the lungs 2 (two) times daily.   MULTIPLE VITAMINS-MINERALS (PRESERVISION AREDS) CAPS    Take 1 capsule by mouth daily with lunch. Take 1 tablet once daily in the evening.   NITROGLYCERIN (NITROSTAT) 0.4 MG SL TABLET    Place 0.4 mg under the tongue every 5 (five) minutes as needed. Take 1 tablet sublingual at onset of chest pain, repeat in 5 minutes, May repeat x 3 in 15 minutes.  Modified Medications   No medications on file  Discontinued Medications   CLINDAMYCIN (CLEOCIN) 300 MG/50ML IVPB    Inject 50 mLs (300 mg total) into the vein every 12 (twelve) hours.   DILTIAZEM (TIAZAC) 300 MG 24 HR CAPSULE    Take 300 mg by mouth daily. Take 1 tablet daily   LEVOFLOXACIN (LEVAQUIN) 750 MG/150ML SOLN    Inject 150 mLs (750 mg total) into the vein daily.     Physical Exam: Physical Exam  Constitutional: He is oriented to person, place, and time. He appears well-developed and well-nourished. No distress.  HENT:  Head: Normocephalic and atraumatic.  Right Ear: External ear normal.  Nose: Nose normal.  Mouth/Throat: Oropharynx is clear and moist. No oropharyngeal exudate.  Eyes: Conjunctivae and EOM are normal. Pupils are equal, round, and reactive to light. Right eye exhibits no discharge. Left eye exhibits no discharge. No scleral icterus.  Neck: Normal range of motion. Neck supple. No JVD present. No tracheal deviation present. No thyromegaly present.  Cardiovascular: Normal rate.  An irregular rhythm present. Exam reveals no gallop and no friction rub.   No murmur heard. Pulmonary/Chest: Effort normal. He has decreased breath sounds. He has no wheezes. He has no rales. He exhibits no tenderness.  Abdominal: Soft. Bowel sounds are normal. He exhibits no distension. There is no tenderness. There is no rebound.  Musculoskeletal: Normal range of motion. He exhibits  tenderness (left knee. ). He exhibits no edema.  Lymphadenopathy:    He has no cervical adenopathy.  Neurological: He is alert and oriented to person, place, and  time. He has normal reflexes. He displays normal reflexes. No cranial nerve deficit. He exhibits normal muscle tone. Coordination normal.  Skin: Skin is warm and dry. No rash noted. He is not diaphoretic. No erythema. No pallor.  Middle and slight to the right upper chest wall carbuncle with 3 heads-lanced and drained-healing nicely. New skin lesion at the left neck, raised, opened, no drainage seen, mild peri-wound erythema.   Psychiatric: He has a normal mood and affect. His behavior is normal. Judgment and thought content normal. Cognition and memory are impaired. He exhibits abnormal recent memory and abnormal remote memory.    Filed Vitals:   07/01/13 1231  BP: 100/65  Pulse: 68  Temp: 97.6 F (36.4 C)  TempSrc: Tympanic  Resp: 18      Labs reviewed: Basic Metabolic Panel:  Recent Labs  45/40/98 1915  04/09/13 1652  04/11/13 0449 04/13/13 0422 04/14/13 0600 04/19/13 04/27/13 05/25/13  NA 129*  < >  --   < > 132* 125* 125* 123*  --  129*  K 5.3*  < >  --   < > 3.8 4.2 4.3 4.4  --  4.1  CL 96  < >  --   < > 101 94* 94*  --   --   --   CO2 24  < >  --   < > 23 23 23   --   --   --   GLUCOSE 90  < >  --   < > 94 98 92  --   --   --   BUN 20  < >  --   < > 12 11 10 16   --  17  CREATININE 1.19  < >  --   < > 1.13 1.08 1.00 1.0  --  1.1  CALCIUM 9.2  < >  --   < > 8.4 8.4 8.4  --   --   --   MG 2.1  --  2.0  --   --   --   --   --   --   --   PHOS 2.7  --  3.0  --   --   --   --   --   --   --   TSH  --   < >  --   < >  --   --   --   --  3.72  --   < > = values in this interval not displayed. Liver Function Tests:  Recent Labs  04/02/13 2235 04/10/13 0440 04/13/13 0422  AST 14 12 13   ALT 8 6 6   ALKPHOS 57 49 53  BILITOT 0.4 0.3 0.2*  PROT 7.5 6.2 6.6  ALBUMIN 3.1* 2.2* 2.3*   CBC:  Recent Labs   08/02/12 1215  04/02/13 2235 04/09/13 1652  04/11/13 0449 04/13/13 0422 04/14/13 0600 04/19/13 05/25/13  WBC 12.1*  < > 9.5 7.4  < > 5.9 5.8 6.3 7.1 5.6  NEUTROABS 9.4*  --  6.3 5.1  --   --   --   --   --   --   HGB 11.0*  < > 10.0* 9.8*  < > 8.4* 8.4* 8.6* 8.5* 8.5*  HCT 30.6*  < > 29.7* 28.0*  < > 25.0* 23.9* 24.0* 24* 95*  MCV 92.4  < > 98.0 97.6  < > 100.4* 98.0 95.6  --   --   PLT 243  < > 202 264  < >  269 328 381 367 235  < > = values in this interval not displayed. Lipid Panel:  Recent Labs  07/06/12 0415  CHOL 151  HDL 44  LDLCALC 93  TRIG 70  CHOLHDL 3.4    Past Procedures: 06/22/13 X-ray left knee: moderate to advanced OA most prominent at the medial compartment and patellofemoral joint space, small degenerative spur at the superior patella., no acute fracture, subluxation, dislocation, or lytic destructive lesion, small knee effusion.      Assessment/Plan Hyponatremia Stable on Demeclocycline, last Na 129 05/25/13    A-fib Rate controlled on Diltiazem 180mg         Dementia Presently taking Namenda and able to converse w/o behavioral problems.         Anemia Hgb 8.5 05/25/13 and was 8.5 04/19/13, takes Iron 325mg  daily.     Unspecified hypothyroidism Takes Levothyroxine daily,TSH 3.715 04/27/13        HTN (hypertension) Some low measurements 100s/50s--the patient is asymptomatic, only takes Cardizem 180mg  for A-fib--may have to decrease it if the patient becomes symptomatic-continue to observe.     GERD Stable on Famotidine 20mg  daily.   COPD Maintained on Advair and Mucinex.     Skin lesion Left lateral neck, opened w/o drainage, raised with mild erythema peri-wound--will apply ABT ointment daily to the area, may consider Dermatology referral is no better.   Left knee pain 06/22/13 X-ray left knee: moderate to advanced OA most prominent at the medial compartment and patellofemoral joint space, small degenerative spur at the  superior patella., no acute fracture, subluxation, dislocation, or lytic destructive lesion, small knee effusion. Lidoderm patch eased off his pain. W/c for mobility.     Family/ Staff Communication: observe the patient  Goals of Care: SNF  Labs/tests ordered:

## 2013-07-01 NOTE — Assessment & Plan Note (Signed)
Presently taking Namenda and able to converse w/o behavioral problems.                                   

## 2013-07-01 NOTE — Assessment & Plan Note (Signed)
Left lateral neck, opened w/o drainage, raised with mild erythema peri-wound--will apply ABT ointment daily to the area, may consider Dermatology referral is no better.

## 2013-07-01 NOTE — Assessment & Plan Note (Signed)
Takes Levothyroxine 50mcg daily,TSH 3.715 04/27/13           

## 2013-07-01 NOTE — Assessment & Plan Note (Signed)
Maintained on Advair and Mucinex.

## 2013-07-01 NOTE — Assessment & Plan Note (Signed)
Some low measurements 100s/50s--the patient is asymptomatic, only takes Cardizem 180mg for A-fib--may have to decrease it if the patient becomes symptomatic-continue to observe.        

## 2013-07-01 NOTE — Assessment & Plan Note (Signed)
Rate controlled on Diltiazem 180mg                     

## 2013-07-01 NOTE — Assessment & Plan Note (Addendum)
06/22/13 X-ray left knee: moderate to advanced OA most prominent at the medial compartment and patellofemoral joint space, small degenerative spur at the superior patella., no acute fracture, subluxation, dislocation, or lytic destructive lesion, small knee effusion. Lidoderm patch eased off his pain. W/c for mobility.

## 2013-07-01 NOTE — Assessment & Plan Note (Signed)
Hgb 8.5 05/25/13 and was 8.5 04/19/13, takes Iron 325mg  daily.

## 2013-07-07 ENCOUNTER — Encounter: Payer: Self-pay | Admitting: Nurse Practitioner

## 2013-07-07 NOTE — Progress Notes (Signed)
This encounter was created in error - please disregard.

## 2013-07-08 ENCOUNTER — Non-Acute Institutional Stay (SKILLED_NURSING_FACILITY): Payer: Medicare Other | Admitting: Nurse Practitioner

## 2013-07-08 ENCOUNTER — Encounter: Payer: Self-pay | Admitting: Nurse Practitioner

## 2013-07-08 DIAGNOSIS — D649 Anemia, unspecified: Secondary | ICD-10-CM

## 2013-07-08 DIAGNOSIS — L03221 Cellulitis of neck: Secondary | ICD-10-CM

## 2013-07-08 DIAGNOSIS — R609 Edema, unspecified: Secondary | ICD-10-CM

## 2013-07-08 DIAGNOSIS — I10 Essential (primary) hypertension: Secondary | ICD-10-CM

## 2013-07-08 DIAGNOSIS — L0211 Cutaneous abscess of neck: Secondary | ICD-10-CM

## 2013-07-08 DIAGNOSIS — E039 Hypothyroidism, unspecified: Secondary | ICD-10-CM

## 2013-07-08 DIAGNOSIS — E871 Hypo-osmolality and hyponatremia: Secondary | ICD-10-CM

## 2013-07-08 DIAGNOSIS — F0390 Unspecified dementia without behavioral disturbance: Secondary | ICD-10-CM

## 2013-07-08 NOTE — Assessment & Plan Note (Signed)
Presently taking Namenda and able to converse w/o behavioral problems.                                   

## 2013-07-08 NOTE — Assessment & Plan Note (Signed)
Some low measurements 100s/50s--the patient is asymptomatic, only takes Cardizem 180mg  for A-fib--may have to decrease it if the patient becomes symptomatic-continue to observe.

## 2013-07-08 NOTE — Assessment & Plan Note (Signed)
Resulted from from the furuncle-pain is resolved after I+D, but the peri-wound swelling and mild erythematous--will have Levaquin 250mg  daily for 2 weeks and continue to apply Bactroban to the affected area.

## 2013-07-08 NOTE — Assessment & Plan Note (Signed)
Hgb 8.5 05/25/13 and was 8.5 04/19/13, takes Iron 325mg  daily. Update CBC

## 2013-07-08 NOTE — Assessment & Plan Note (Signed)
Takes Levothyroxine 50mcg daily,TSH 3.715 04/27/13           

## 2013-07-08 NOTE — Assessment & Plan Note (Signed)
Trace in his left ankle,  stopped taking Lasix upon discharge, monitor wt and resume as indicated.          

## 2013-07-08 NOTE — Progress Notes (Signed)
Patient ID: [name], male   DOB: [birth year], 79 y.o.   MRN: [id number] Code Status: DNR  Allergies  Allergen Reactions  . Sulfonamide Derivatives Other (See Comments)    unknown  . Penicillins Rash    Chief Complaint  Patient presents with  . Medical Managment of Chronic Issues    left neck cellulitis    HPI: Patient is a 79 y.o. male seen in the SNF at [facility] today for evaluation of  Left neck cellulitis and other chronic medical conditions.  Problem List Items Addressed This Visit   Anemia     Hgb 8.5 05/25/13 and was 8.5 04/19/13, takes Iron 325mg  daily. Update CBC        Cellulitis, neck - Primary     Resulted from from the furuncle-pain is resolved after I+D, but the peri-wound swelling and mild erythematous--will have Levaquin 250mg  daily for 2 weeks and continue to apply Bactroban to the affected area.     Dementia     Presently taking Namenda and able to converse w/o behavioral problems.             Edema     Trace in his left ankle,  stopped taking Lasix upon discharge, monitor wt and resume as indicated.           HTN (hypertension)     Some low measurements 100s/50s--the patient is asymptomatic, only takes Cardizem 180mg  for A-fib--may have to decrease it if the patient becomes symptomatic-continue to observe.         Hyponatremia (Chronic)     Stable on Demeclocycline, last Na 129 05/25/13, update BMP        Unspecified hypothyroidism     Takes Levothyroxine daily,TSH 3.715 04/27/13             Review of Systems:  Review of Systems  Constitutional: Positive for weight loss. Negative for fever, chills and diaphoresis.  HENT: Positive for hearing loss. Negative for ear pain, congestion, sore throat, neck pain and ear discharge.   Eyes: Negative for blurred vision, double vision, photophobia, pain, discharge and redness.  Respiratory: Positive for cough. Negative for hemoptysis, sputum production, shortness of  breath and wheezing.   Cardiovascular: Positive for leg swelling and PND. Negative for chest pain, palpitations, orthopnea and claudication.  Gastrointestinal: Negative for heartburn, nausea, vomiting, abdominal pain, diarrhea, constipation and blood in stool.  Genitourinary: Positive for frequency. Negative for dysuria, urgency, hematuria and flank pain.  Musculoskeletal: Positive for joint pain (left knee pain ). Negative for myalgias, back pain and falls.  Skin: Negative for itching and rash.       Left neck mild swelling around the opened furuncle-pain is resolved after I+D, but the mild warmth and swelling persisted.   Neurological: Positive for weakness. Negative for dizziness, tingling, tremors, sensory change, speech change, focal weakness, seizures, loss of consciousness and headaches.  Endo/Heme/Allergies: Negative for environmental allergies and polydipsia. Does not bruise/bleed easily.  Psychiatric/Behavioral: Positive for memory loss. Negative for depression, hallucinations and substance abuse. The patient is not nervous/anxious and does not have insomnia.      Past Medical History  Diagnosis Date  . Allergic rhinitis   . 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 COPD (chronic obstructive pulmonary disease)   . Diverticula of colon   . Right leg DVT 01/2010  . IHD (ischemic heart disease)   . Hyperlipidemia   . Hypertension   . Depression   . Myalgia   .  Hypothyroidism   . Bronchiectasis   . Spinal stenosis   . History of heartburn   . Joint pain   . 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 Coronary artery disease   . Pneumonia   . Arthritis   . CHF (congestive heart failure)   . Impacted cerumen 12/21/2012  . Arthralgia of temporomandibular joint 12/21/2012  . Contusion of hand(s) 10/07/2012  . Personal history of fall 10/07/2012  . Other manifestations of vitamin A deficiency 08/20/2012  . Anemia, unspecified 10/.01/2012  . Essential  and other specified forms of tremor 08/20/2012  . Other emphysema 08/20/2012  . Muscle weakness (generalized) 10/.01/2012  . Kyphosis (acquired) (postural) 08/20/2012  . Abnormality of gait 08/20/2012  . Edema 08/20/2012  . Rash and other nonspecific skin eruption 10.01/2012  . Nonspecific abnormal results of pulmonary system function study 08/20/2012  . Dysphagia, unspecified(787.20) 08/20/2012  . Nonspecific abnormal results of pulmonary system function study 08/20/2012  . Candidiasis of skin and nails 08/17/2012  . Memory loss 07/13/2012  . Hyposmolality and/or hyponatremia 07/08/2012  . Hypopotassemia 07/08/2012  . Acute posthemorrhagic anemia 07/08/2012  . Major depressive disorder, single episode, unspecified 07/08/2012  . Atrial fibrillation 07/08/2012  . Hypotension, unspecified 07/08/2012  . Allergic rhinitis due to pollen 07/08/2012  . Bronchiectasis without acute exacerbation 07/08/2012  . Chronic airway obstruction, not elsewhere classified 07/08/2012  . Reflux esophagitis 07/08/2012  . Spinal stenosis, unspecified region other than cervical 07/08/2012  . Edema 07/08/2012  . Chest pain, unspecified 07/08/2012  . Transient ischemic attack (TIA), and cerebral infarction without residual deficits(V12.54) 07/08/2012   Past Surgical History  Procedure Laterality Date  . Cardiac catheterization  06/13/2000    normal. ef 60%  . Cardiovascular stress test  04/2010    ef 54%, and normal perfusion  . Coronary angioplasty  1991    1st diagonal  . Carpal tunnel release      right  . Rotator cuff repair      left shoulder  . Knee surgery  August  2006    S/P right knee replacement  . Laminectomy      2005  . Cataract extraction      Bilateral    Social History:   reports that he quit smoking about 60 years ago. His smoking use included Cigarettes. He has a 25 pack-year smoking history. He has never used smokeless tobacco. He reports that he does not drink alcohol or use  illicit drugs.  Family History  Problem Relation Age of Onset  . Heart disease Father   . Heart failure Father   . Breast cancer Maternal Aunt     Medications: Patient's Medications  New Prescriptions   No medications on file  Previous Medications   ACETAMINOPHEN (TYLENOL) 500 MG TABLET    Take 500 mg by mouth 2 (two) times daily. pain   ALBUTEROL (PROVENTIL) (5 MG/ML) 0.5% NEBULIZER SOLUTION    Take 0.5 mLs (2.5 mg total) by nebulization 4 (four) times daily.   ASPIRIN EC 81 MG TABLET    Take 81 mg by mouth every morning. Take 1 tablet daily to prevent heart attack and stroke.   DEMECLOCYCLINE (DECLOMYCIN) 300 MG TABLET    Take 300 mg by mouth 2 (two) times daily.   DILTIAZEM (DILACOR XR) 180 MG 24 HR CAPSULE    Take 180 mg by mouth every morning.   FAMOTIDINE (PEPCID) 20 MG TABLET    Take 20 mg  by mouth at bedtime. Take 1 tablet once daily.   FEEDING SUPPLEMENT (ENSURE) PUDG    Take 1 Container by mouth 3 (three) times daily between meals.   FERROUS SULFATE 325 (65 FE) MG TABLET    Take 325 mg by mouth daily with lunch.   FLUTICASONE (FLONASE) 50 MCG/ACT NASAL SPRAY    Place 2 sprays into the nose 2 (two) times daily. Two sprays in each nostril, 2 times daily.   FLUTICASONE-SALMETEROL (ADVAIR) 250-50 MCG/DOSE AEPB    Inhale 1 puff into the lungs every 12 (twelve) hours. Take 1 puff twice daily for COPD.   GUAIFENESIN (MUCINEX) 600 MG 12 HR TABLET    Take 1,200 mg by mouth 2 (two) times daily.   LEVOTHYROXINE (SYNTHROID, LEVOTHROID) 50 MCG TABLET    Take 50 mcg by mouth every morning. Take 1 tablet once daily for thyroid.   MEMANTINE HCL ER (NAMENDA XR) 28 MG CP24    Take 1 capsule by mouth every morning.   MOMETASONE-FORMOTEROL (DULERA) 100-5 MCG/ACT AERO    Inhale 2 puffs into the lungs 2 (two) times daily.   MULTIPLE VITAMINS-MINERALS (PRESERVISION AREDS) CAPS    Take 1 capsule by mouth daily with lunch. Take 1 tablet once daily in the evening.   NITROGLYCERIN (NITROSTAT) 0.4 MG SL  TABLET    Place 0.4 mg under the tongue every 5 (five) minutes as needed. Take 1 tablet sublingual at onset of chest pain, repeat in 5 minutes, May repeat x 3 in 15 minutes.  Modified Medications   No medications on file  Discontinued Medications   No medications on file     Physical Exam: Physical Exam  Constitutional: He is oriented to person, place, and time. He appears well-developed and well-nourished. No distress.  HENT:  Head: Normocephalic and atraumatic.  Right Ear: External ear normal.  Nose: Nose normal.  Mouth/Throat: Oropharynx is clear and moist. No oropharyngeal exudate.  Eyes: Conjunctivae and EOM are normal. Pupils are equal, round, and reactive to light. Right eye exhibits no discharge. Left eye exhibits no discharge. No scleral icterus.  Neck: Normal range of motion. Neck supple. No JVD present. No tracheal deviation present. No thyromegaly present.  Cardiovascular: Normal rate.  An irregular rhythm present. Exam reveals no gallop and no friction rub.   No murmur heard. Pulmonary/Chest: Effort normal. He has decreased breath sounds. He has no wheezes. He has no rales. He exhibits no tenderness.  Abdominal: Soft. Bowel sounds are normal. He exhibits no distension. There is no tenderness. There is no rebound.  Musculoskeletal: Normal range of motion. He exhibits edema (trace in the left ankle. ) and tenderness (left knee. ).  Lymphadenopathy:    He has no cervical adenopathy.  Neurological: He is alert and oriented to person, place, and time. He has normal reflexes. No cranial nerve deficit. He exhibits normal muscle tone. Coordination normal.  Skin: Skin is warm and dry. No rash noted. He is not diaphoretic. No erythema. No pallor.   mild peri-wound erythema and swelling  Psychiatric: He has a normal mood and affect. His behavior is normal. Judgment and thought content normal. Cognition and memory are impaired. He exhibits abnormal recent memory and abnormal remote  memory.    Filed Vitals:   07/08/13 1424  BP: 103/62  Pulse: 62  Temp: 97.8 F (36.6 C)  TempSrc: Tympanic  Resp: 18      Labs reviewed: Basic Metabolic Panel:  Recent Labs  16/10/96 1652  04/11/13 0449 04/13/13  0422 04/14/13 0600 04/19/13 04/27/13 05/25/13  NA  --   < > 132* 125* 125* 123*  --  129*  K  --   < > 3.8 4.2 4.3 4.4  --  4.1  CL  --   < > 101 94* 94*  --   --   --   CO2  --   < > 23 23 23   --   --   --   GLUCOSE  --   < > 94 98 92  --   --   --   BUN  --   < > 12 11 10 16   --  17  CREATININE  --   < > 1.13 1.08 1.00 1.0  --  1.1  CALCIUM  --   < > 8.4 8.4 8.4  --   --   --   MG 2.0  --   --   --   --   --   --   --   PHOS 3.0  --   --   --   --   --   --   --   TSH  --   < >  --   --   --   --  3.72  --   < > = values in this interval not displayed. Liver Function Tests:  Recent Labs  04/02/13 2235 04/10/13 0440 04/13/13 0422  AST 14 12 13   ALT 8 6 6   ALKPHOS 57 49 53  BILITOT 0.4 0.3 0.2*  PROT 7.5 6.2 6.6  ALBUMIN 3.1* 2.2* 2.3*   CBC:  Recent Labs  08/02/12 1215  04/02/13 2235 04/09/13 1652  04/11/13 0449 04/13/13 0422 04/14/13 0600 04/19/13 05/25/13  WBC 12.1*  < > 9.5 7.4  < > 5.9 5.8 6.3 7.1 5.6  NEUTROABS 9.4*  --  6.3 5.1  --   --   --   --   --   --   HGB 11.0*  < > 10.0* 9.8*  < > 8.4* 8.4* 8.6* 8.5* 8.5*  HCT 30.6*  < > 29.7* 28.0*  < > 25.0* 23.9* 24.0* 24* 95*  MCV 92.4  < > 98.0 97.6  < > 100.4* 98.0 95.6  --   --   PLT 243  < > 202 264  < > 269 328 381 367 235  < > = values in this interval not displayed.   Past Procedures: 06/22/13 X-ray left knee: moderate to advanced OA most prominent at the medial compartment and patellofemoral joint space, small degenerative spur at the superior patella., no acute fracture, subluxation, dislocation, or lytic destructive lesion, small knee effusion.   Assessment/Plan Cellulitis, neck Resulted from from the furuncle-pain is resolved after I+D, but the peri-wound swelling and mild  erythematous--will have Levaquin 250mg  daily for 2 weeks and continue to apply Bactroban to the affected area.   Unspecified hypothyroidism Takes Levothyroxine daily,TSH 3.715 04/27/13        HTN (hypertension) Some low measurements 100s/50s--the patient is asymptomatic, only takes Cardizem 180mg  for A-fib--may have to decrease it if the patient becomes symptomatic-continue to observe.       Dementia Presently taking Namenda and able to converse w/o behavioral problems.           Hyponatremia Stable on Demeclocycline, last Na 129 05/25/13, update BMP      Anemia Hgb 8.5 05/25/13 and was 8.5 04/19/13, takes Iron 325mg  daily. Update CBC  Edema Trace in his left ankle,  stopped taking Lasix upon discharge, monitor wt and resume as indicated.           Family/ Staff Communication: observe the patient  Goals of Care: SNF  Labs/tests ordered: BMP and CBC

## 2013-07-08 NOTE — Assessment & Plan Note (Signed)
Stable on Demeclocycline, last Na 129 05/25/13, update BMP

## 2013-08-02 ENCOUNTER — Non-Acute Institutional Stay (SKILLED_NURSING_FACILITY): Payer: Medicare Other | Admitting: Nurse Practitioner

## 2013-08-02 DIAGNOSIS — M25562 Pain in left knee: Secondary | ICD-10-CM

## 2013-08-02 DIAGNOSIS — J309 Allergic rhinitis, unspecified: Secondary | ICD-10-CM

## 2013-08-02 DIAGNOSIS — L03221 Cellulitis of neck: Secondary | ICD-10-CM

## 2013-08-02 DIAGNOSIS — L0211 Cutaneous abscess of neck: Secondary | ICD-10-CM

## 2013-08-02 DIAGNOSIS — J4489 Other specified chronic obstructive pulmonary disease: Secondary | ICD-10-CM

## 2013-08-02 DIAGNOSIS — R609 Edema, unspecified: Secondary | ICD-10-CM

## 2013-08-02 DIAGNOSIS — F0390 Unspecified dementia without behavioral disturbance: Secondary | ICD-10-CM

## 2013-08-02 DIAGNOSIS — E039 Hypothyroidism, unspecified: Secondary | ICD-10-CM

## 2013-08-02 DIAGNOSIS — M25569 Pain in unspecified knee: Secondary | ICD-10-CM

## 2013-08-02 DIAGNOSIS — I4891 Unspecified atrial fibrillation: Secondary | ICD-10-CM

## 2013-08-02 DIAGNOSIS — D649 Anemia, unspecified: Secondary | ICD-10-CM

## 2013-08-02 DIAGNOSIS — J449 Chronic obstructive pulmonary disease, unspecified: Secondary | ICD-10-CM

## 2013-08-02 NOTE — Progress Notes (Signed)
Patient ID: [name], male   DOB: 09-12-1924, 79 y.o.   MRN: [id number] Code Status: DNR  Allergies  Allergen Reactions  . Sulfonamide Derivatives Other (See Comments)    unknown  . Penicillins Rash    Chief Complaint  Patient presents with  . Medical Managment of Chronic Issues    cough per staff's request.     HPI: Patient is a 79 y.o. male seen in the SNF at [facility] today for evaluation of cough, treated Left neck cellulitis and other chronic medical conditions.  Problem List Items Addressed This Visit   A-fib     Rate controlled on Diltiazem 180mg             ALLERGIC RHINITIS     Managed with Fluticasone nasal spray bid.     Anemia     Hgb 8s-8.8 08/03/13, off Iron 325mg            Cellulitis, neck     Resolved and dc topical ABT ointment.     COPD - Primary     Maintained on Advair and Mucinex. Chronic cough 08/01/13-the patient stated it was better 08/02/13 upon my visit. CXR 08/03/13 showed patchy pneumonitis/oinflammatory consolidate at the right upper lung and left mid/lower lung improved.         Dementia     Presently taking Namenda and able to converse w/o behavioral problems.             Edema     Trace in his left ankle,  stopped taking Lasix upon discharge, monitor wt and resume as indicated.             Left knee pain     06/22/13 X-ray left knee: moderate to advanced OA most prominent at the medial compartment and patellofemoral joint space, small degenerative spur at the superior patella., no acute fracture, subluxation, dislocation, or lytic destructive lesion, small knee effusion. Lidoderm patch eased off his pain, takes Tylenol 650mg  bid. W/c for mobility.       Unspecified hypothyroidism     Takes Levothyroxine daily,TSH 3.715 04/27/13               Review of Systems:  Review of Systems  Constitutional: Positive for weight loss. Negative for fever, chills and diaphoresis.  HENT:  Positive for hearing loss. Negative for ear pain, congestion, sore throat, neck pain and ear discharge.   Eyes: Negative for blurred vision, double vision, photophobia, pain, discharge and redness.  Respiratory: Positive for cough. Negative for hemoptysis, sputum production, shortness of breath and wheezing.   Cardiovascular: Positive for leg swelling and PND. Negative for chest pain, palpitations, orthopnea and claudication.  Gastrointestinal: Negative for heartburn, nausea, vomiting, abdominal pain, diarrhea, constipation and blood in stool.  Genitourinary: Positive for frequency. Negative for dysuria, urgency, hematuria and flank pain.  Musculoskeletal: Positive for joint pain (left knee pain ). Negative for myalgias, back pain and falls.  Skin: Negative for itching and rash.       Left neck mild swelling around the opened furuncle-pain is resolved after I+D, near resolution.   Neurological: Positive for weakness. Negative for dizziness, tingling, tremors, sensory change, speech change, focal weakness, seizures, loss of consciousness and headaches.  Endo/Heme/Allergies: Negative for environmental allergies and polydipsia. Does not bruise/bleed easily.  Psychiatric/Behavioral: Positive for memory loss. Negative for depression, hallucinations and substance abuse. The patient is not nervous/anxious and does not have insomnia.      Past Medical History  Diagnosis  Date  . Allergic rhinitis   . 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 COPD (chronic obstructive pulmonary disease)   . Diverticula of colon   . Right leg DVT 01/2010  . IHD (ischemic heart disease)   . Hyperlipidemia   . Hypertension   . Depression   . Myalgia   . Hypothyroidism   . Bronchiectasis   . Spinal stenosis   . History of heartburn   . Joint pain   . 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 Coronary artery disease   . Pneumonia   . Arthritis   . CHF (congestive heart failure)   .  Impacted cerumen 12/21/2012  . Arthralgia of temporomandibular joint 12/21/2012  . Contusion of hand(s) 10/07/2012  . Personal history of fall 10/07/2012  . Other manifestations of vitamin A deficiency 08/20/2012  . Anemia, unspecified 10/.01/2012  . Essential and other specified forms of tremor 08/20/2012  . Other emphysema 08/20/2012  . Muscle weakness (generalized) 10/.01/2012  . Kyphosis (acquired) (postural) 08/20/2012  . Abnormality of gait 08/20/2012  . Edema 08/20/2012  . Rash and other nonspecific skin eruption 10.01/2012  . Nonspecific abnormal results of pulmonary system function study 08/20/2012  . Dysphagia, unspecified(787.20) 08/20/2012  . Nonspecific abnormal results of pulmonary system function study 08/20/2012  . Candidiasis of skin and nails 08/17/2012  . Memory loss 07/13/2012  . Hyposmolality and/or hyponatremia 07/08/2012  . Hypopotassemia 07/08/2012  . Acute posthemorrhagic anemia 07/08/2012  . Major depressive disorder, single episode, unspecified 07/08/2012  . Atrial fibrillation 07/08/2012  . Hypotension, unspecified 07/08/2012  . Allergic rhinitis due to pollen 07/08/2012  . Bronchiectasis without acute exacerbation 07/08/2012  . Chronic airway obstruction, not elsewhere classified 07/08/2012  . Reflux esophagitis 07/08/2012  . Spinal stenosis, unspecified region other than cervical 07/08/2012  . Edema 07/08/2012  . Chest pain, unspecified 07/08/2012  . Transient ischemic attack (TIA), and cerebral infarction without residual deficits(V12.54) 07/08/2012   Past Surgical History  Procedure Laterality Date  . Cardiac catheterization  06/13/2000    normal. ef 60%  . Cardiovascular stress test  04/2010    ef 54%, and normal perfusion  . Coronary angioplasty  1991    1st diagonal  . Carpal tunnel release      right  . Rotator cuff repair      left shoulder  . Knee surgery  August  2006    S/P right knee replacement  . Laminectomy      2005  . Cataract  extraction      Bilateral    Social History:   reports that he quit smoking about 60 years ago. His smoking use included Cigarettes. He has a 25 pack-year smoking history. He has never used smokeless tobacco. He reports that he does not drink alcohol or use illicit drugs.  Family History  Problem Relation Age of Onset  . Heart disease Father   . Heart failure Father   . Breast cancer Maternal Aunt     Medications: Patient's Medications  New Prescriptions   No medications on file  Previous Medications   ACETAMINOPHEN (TYLENOL) 500 MG TABLET    Take 500 mg by mouth 2 (two) times daily. pain   ALBUTEROL (PROVENTIL) (5 MG/ML) 0.5% NEBULIZER SOLUTION    Take 0.5 mLs (2.5 mg total) by nebulization 4 (four) times daily.   ASPIRIN EC 81 MG TABLET    Take 81 mg by mouth every morning. Take 1  tablet daily to prevent heart attack and stroke.   DEMECLOCYCLINE (DECLOMYCIN) 300 MG TABLET    Take 300 mg by mouth 2 (two) times daily.   DILTIAZEM (DILACOR XR) 180 MG 24 HR CAPSULE    Take 180 mg by mouth every morning.   FAMOTIDINE (PEPCID) 20 MG TABLET    Take 20 mg by mouth at bedtime. Take 1 tablet once daily.   FEEDING SUPPLEMENT (ENSURE) PUDG    Take 1 Container by mouth 3 (three) times daily between meals.   FERROUS SULFATE 325 (65 FE) MG TABLET    Take 325 mg by mouth daily with lunch.   FLUTICASONE (FLONASE) 50 MCG/ACT NASAL SPRAY    Place 2 sprays into the nose 2 (two) times daily. Two sprays in each nostril, 2 times daily.   FLUTICASONE-SALMETEROL (ADVAIR) 250-50 MCG/DOSE AEPB    Inhale 1 puff into the lungs every 12 (twelve) hours. Take 1 puff twice daily for COPD.   GUAIFENESIN (MUCINEX) 600 MG 12 HR TABLET    Take 1,200 mg by mouth 2 (two) times daily.   LEVOTHYROXINE (SYNTHROID, LEVOTHROID) 50 MCG TABLET    Take 50 mcg by mouth every morning. Take 1 tablet once daily for thyroid.   MEMANTINE HCL ER (NAMENDA XR) 28 MG CP24    Take 1 capsule by mouth every morning.   MOMETASONE-FORMOTEROL  (DULERA) 100-5 MCG/ACT AERO    Inhale 2 puffs into the lungs 2 (two) times daily.   MULTIPLE VITAMINS-MINERALS (PRESERVISION AREDS) CAPS    Take 1 capsule by mouth daily with lunch. Take 1 tablet once daily in the evening.   NITROGLYCERIN (NITROSTAT) 0.4 MG SL TABLET    Place 0.4 mg under the tongue every 5 (five) minutes as needed. Take 1 tablet sublingual at onset of chest pain, repeat in 5 minutes, May repeat x 3 in 15 minutes.  Modified Medications   No medications on file  Discontinued Medications   No medications on file     Physical Exam: Physical Exam  Constitutional: He is oriented to person, place, and time. He appears well-developed and well-nourished. No distress.  HENT:  Head: Normocephalic and atraumatic.  Right Ear: External ear normal.  Nose: Nose normal.  Mouth/Throat: Oropharynx is clear and moist. No oropharyngeal exudate.  Eyes: Conjunctivae and EOM are normal. Pupils are equal, round, and reactive to light. Right eye exhibits no discharge. Left eye exhibits no discharge. No scleral icterus.  Neck: Normal range of motion. Neck supple. No JVD present. No tracheal deviation present. No thyromegaly present.  Cardiovascular: Normal rate.  An irregular rhythm present. Exam reveals no gallop and no friction rub.   No murmur heard. Pulmonary/Chest: Effort normal. He has decreased breath sounds. He has no wheezes. He has no rales. He exhibits no tenderness.  Abdominal: Soft. Bowel sounds are normal. He exhibits no distension. There is no tenderness. There is no rebound.  Musculoskeletal: Normal range of motion. He exhibits edema (trace in the left ankle. ) and tenderness (left knee. ).  Lymphadenopathy:    He has no cervical adenopathy.  Neurological: He is alert and oriented to person, place, and time. He has normal reflexes. No cranial nerve deficit. He exhibits normal muscle tone. Coordination normal.  Skin: Skin is warm and dry. No rash noted. He is not diaphoretic. No  erythema. No pallor.   mild peri-wound erythema and swelling-improved.   Psychiatric: He has a normal mood and affect. His behavior is normal. Judgment and thought content normal.  Cognition and memory are impaired. He exhibits abnormal recent memory and abnormal remote memory.    Filed Vitals:   08/04/13 1156  BP: 110/62  Pulse: 70  Temp: 96.5 F (35.8 C)  TempSrc: Tympanic  Resp: 18      Labs reviewed: Basic Metabolic Panel:  Recent Labs  16/10/96 1652  04/11/13 0449 04/13/13 0422 04/14/13 0600 04/19/13 04/27/13 05/25/13 08/03/13  NA  --   < > 132* 125* 125* 123*  --  129* 131*  K  --   < > 3.8 4.2 4.3 4.4  --  4.1 4.3  CL  --   < > 101 94* 94*  --   --   --   --   CO2  --   < > 23 23 23   --   --   --   --   GLUCOSE  --   < > 94 98 92  --   --   --   --   BUN  --   < > 12 11 10 16   --  17 13  CREATININE  --   < > 1.13 1.08 1.00 1.0  --  1.1 1.1  CALCIUM  --   < > 8.4 8.4 8.4  --   --   --   --   MG 2.0  --   --   --   --   --   --   --   --   PHOS 3.0  --   --   --   --   --   --   --   --   TSH  --   < >  --   --   --   --  3.72  --   --   < > = values in this interval not displayed. Liver Function Tests:  Recent Labs  04/02/13 2235 04/10/13 0440 04/13/13 0422  AST 14 12 13   ALT 8 6 6   ALKPHOS 57 49 53  BILITOT 0.4 0.3 0.2*  PROT 7.5 6.2 6.6  ALBUMIN 3.1* 2.2* 2.3*   CBC:  Recent Labs  04/02/13 2235 04/09/13 1652  04/11/13 0449 04/13/13 0422 04/14/13 0600 04/19/13 05/25/13 08/03/13  WBC 9.5 7.4  < > 5.9 5.8 6.3 7.1 5.6 6.0  NEUTROABS 6.3 5.1  --   --   --   --   --   --   --   HGB 10.0* 9.8*  < > 8.4* 8.4* 8.6* 8.5* 8.5* 8.8*  HCT 29.7* 28.0*  < > 25.0* 23.9* 24.0* 24* 95* 26*  MCV 98.0 97.6  < > 100.4* 98.0 95.6  --   --   --   PLT 202 264  < > 269 328 381 367 235 273  < > = values in this interval not displayed.   Past Procedures: 06/22/13 X-ray left knee: moderate to advanced OA most prominent at the medial compartment and patellofemoral joint  space, small degenerative spur at the superior patella., no acute fracture, subluxation, dislocation, or lytic destructive lesion, small knee effusion.   08/03/13 CXR no cardiomegaly or pulmonary vascular congestion, old granulomatous disease, patchy pneumonitis/inflammatory consolidate at the right upper lung and left mid/lower lung improved in the interval.   Assessment/Plan COPD Maintained on Advair and Mucinex. Chronic cough 08/01/13-the patient stated it was better 08/02/13 upon my visit. CXR 08/03/13 showed patchy pneumonitis/oinflammatory consolidate at the right upper lung and left mid/lower lung improved.  Cellulitis, neck Resolved and dc topical ABT ointment.   Anemia Hgb 8s-8.8 08/03/13, off Iron 325mg          Unspecified hypothyroidism Takes Levothyroxine daily,TSH 3.715 04/27/13          A-fib Rate controlled on Diltiazem 180mg           ALLERGIC RHINITIS Managed with Fluticasone nasal spray bid.   Dementia Presently taking Namenda and able to converse w/o behavioral problems.           Left knee pain 06/22/13 X-ray left knee: moderate to advanced OA most prominent at the medial compartment and patellofemoral joint space, small degenerative spur at the superior patella., no acute fracture, subluxation, dislocation, or lytic destructive lesion, small knee effusion. Lidoderm patch eased off his pain, takes Tylenol 650mg  bid. W/c for mobility.     Edema Trace in his left ankle,  stopped taking Lasix upon discharge, monitor wt and resume as indicated.             Family/ Staff Communication: observe the patient  Goals of Care: SNF  Labs/tests ordered: CXR, CBC, BMP done 08/03/13

## 2013-08-03 LAB — CBC AND DIFFERENTIAL
Hemoglobin: 8.8 g/dL — AB (ref 13.5–17.5)
Platelets: 273 10*3/uL (ref 150–399)

## 2013-08-03 LAB — BASIC METABOLIC PANEL
Creatinine: 1.1 mg/dL (ref 0.6–1.3)
Potassium: 4.3 mmol/L (ref 3.4–5.3)

## 2013-08-04 ENCOUNTER — Encounter: Payer: Self-pay | Admitting: Nurse Practitioner

## 2013-08-04 NOTE — Assessment & Plan Note (Signed)
06/22/13 X-ray left knee: moderate to advanced OA most prominent at the medial compartment and patellofemoral joint space, small degenerative spur at the superior patella., no acute fracture, subluxation, dislocation, or lytic destructive lesion, small knee effusion. Lidoderm patch eased off his pain, takes Tylenol 650mg  bid. W/c for mobility.

## 2013-08-04 NOTE — Assessment & Plan Note (Signed)
Takes Levothyroxine 50mcg daily,TSH 3.715 04/27/13           

## 2013-08-04 NOTE — Assessment & Plan Note (Signed)
Rate controlled on Diltiazem 180mg                     

## 2013-08-04 NOTE — Assessment & Plan Note (Signed)
Hgb 8s-8.8 08/03/13, off Iron 325mg 

## 2013-08-04 NOTE — Assessment & Plan Note (Signed)
Presently taking Namenda and able to converse w/o behavioral problems.                                   

## 2013-08-04 NOTE — Assessment & Plan Note (Signed)
Managed with Fluticasone nasal spray bid.

## 2013-08-04 NOTE — Assessment & Plan Note (Signed)
Resolved and dc topical ABT ointment.

## 2013-08-04 NOTE — Assessment & Plan Note (Signed)
Maintained on Advair and Mucinex. Chronic cough 08/01/13-the patient stated it was better 08/02/13 upon my visit. CXR 08/03/13 showed patchy pneumonitis/oinflammatory consolidate at the right upper lung and left mid/lower lung improved.

## 2013-08-04 NOTE — Assessment & Plan Note (Signed)
Trace in his left ankle,  stopped taking Lasix upon discharge, monitor wt and resume as indicated.

## 2013-08-12 ENCOUNTER — Encounter: Payer: Self-pay | Admitting: Cardiology

## 2013-09-01 ENCOUNTER — Non-Acute Institutional Stay (SKILLED_NURSING_FACILITY): Payer: Medicare Other | Admitting: Nurse Practitioner

## 2013-09-01 ENCOUNTER — Encounter: Payer: Self-pay | Admitting: Nurse Practitioner

## 2013-09-01 DIAGNOSIS — F0390 Unspecified dementia without behavioral disturbance: Secondary | ICD-10-CM

## 2013-09-01 DIAGNOSIS — K219 Gastro-esophageal reflux disease without esophagitis: Secondary | ICD-10-CM

## 2013-09-01 DIAGNOSIS — D649 Anemia, unspecified: Secondary | ICD-10-CM

## 2013-09-01 DIAGNOSIS — J479 Bronchiectasis, uncomplicated: Secondary | ICD-10-CM

## 2013-09-01 DIAGNOSIS — R609 Edema, unspecified: Secondary | ICD-10-CM

## 2013-09-01 DIAGNOSIS — J449 Chronic obstructive pulmonary disease, unspecified: Secondary | ICD-10-CM

## 2013-09-01 DIAGNOSIS — E871 Hypo-osmolality and hyponatremia: Secondary | ICD-10-CM

## 2013-09-01 DIAGNOSIS — R269 Unspecified abnormalities of gait and mobility: Secondary | ICD-10-CM

## 2013-09-01 DIAGNOSIS — R2681 Unsteadiness on feet: Secondary | ICD-10-CM

## 2013-09-01 DIAGNOSIS — E039 Hypothyroidism, unspecified: Secondary | ICD-10-CM

## 2013-09-01 DIAGNOSIS — I4891 Unspecified atrial fibrillation: Secondary | ICD-10-CM

## 2013-09-01 NOTE — Assessment & Plan Note (Signed)
Stable on Famotidine 20mg daily.   

## 2013-09-01 NOTE — Assessment & Plan Note (Signed)
Chronic cough and phlegm production.

## 2013-09-01 NOTE — Assessment & Plan Note (Signed)
Stable on Demeclocycline, last Na 129 05/25/13, 131 08/03/13

## 2013-09-01 NOTE — Assessment & Plan Note (Signed)
Hgb 8s-8.8 08/03/13, off Iron 325mg 

## 2013-09-01 NOTE — Assessment & Plan Note (Signed)
Presently taking Namenda and able to converse w/o behavioral problems.                                   

## 2013-09-01 NOTE — Assessment & Plan Note (Signed)
Rate controlled on Diltiazem 180mg                     

## 2013-09-01 NOTE — Assessment & Plan Note (Signed)
Trace in his left ankle,  stopped taking Lasix upon discharge, monitor wt and resume as indicated. Weights are stable in the past 3 months-#[id number]               

## 2013-09-01 NOTE — Assessment & Plan Note (Signed)
Maintained on Advair and Mucinex. Chronic cough. CXR 08/03/13 showed patchy pneumonitis/oinflammatory consolidate at the right upper lung and left mid/lower lung improved. Last treated with 2 week course Levaquin was 08/10/13.

## 2013-09-01 NOTE — Assessment & Plan Note (Signed)
Takes Levothyroxine daily,TSH 3.715 04/27/13

## 2013-09-01 NOTE — Progress Notes (Signed)
Patient ID: [name], male   DOB: Oct 04, 1924, 79 y.o.   MRN: [id number]  Code Status: DNR  Allergies  Allergen Reactions  . Sulfonamide Derivatives Other (See Comments)    unknown  . Penicillins Rash    Chief Complaint  Patient presents with  . Medical Managment of Chronic Issues    HPI: Patient is a 79 y.o. male seen in the SNF at [facility] today for evaluation of  chronic medical conditions.  Problem List Items Addressed This Visit   A-fib - Primary     Rate controlled on Diltiazem 180mg               Anemia     Hgb 8s-8.8 08/03/13, off Iron 325mg              BRONCHIECTASIS W/O ACUTE EXACERBATION     Chronic cough and phlegm production.       COPD     Maintained on Advair and Mucinex. Chronic cough. CXR 08/03/13 showed patchy pneumonitis/oinflammatory consolidate at the right upper lung and left mid/lower lung improved. Last treated with 2 week course Levaquin was 08/10/13.           Dementia     Presently taking Namenda and able to converse w/o behavioral problems.                              Edema     Trace in his left ankle,  stopped taking Lasix upon discharge, monitor wt and resume as indicated. Weights are stable in the past 3 months-#174              Gait instability     W/c for mobility and ambulates with walker to/from meals.       GERD     Stable on Famotidine 20mg  daily.       Hyponatremia (Chronic)     Stable on Demeclocycline, last Na 129 05/25/13, 131 08/03/13          Unspecified hypothyroidism     Takes Levothyroxine daily,TSH 3.715 04/27/13                 Review of Systems:  Review of Systems  Constitutional: Negative for fever, chills, weight loss and diaphoresis.  HENT: Positive for hearing loss. Negative for congestion, ear discharge, ear pain and sore throat.   Eyes: Negative for blurred vision, double vision, photophobia, pain, discharge and  redness.  Respiratory: Positive for cough. Negative for hemoptysis, sputum production, shortness of breath and wheezing.   Cardiovascular: Positive for leg swelling and PND. Negative for chest pain, palpitations, orthopnea and claudication.  Gastrointestinal: Negative for heartburn, nausea, vomiting, abdominal pain, diarrhea, constipation and blood in stool.  Genitourinary: Positive for frequency. Negative for dysuria, urgency, hematuria and flank pain.  Musculoskeletal: Positive for joint pain (left knee pain ). Negative for back pain, falls, myalgias and neck pain.  Skin: Negative for itching and rash.  Neurological: Positive for weakness. Negative for dizziness, tingling, tremors, sensory change, speech change, focal weakness, seizures, loss of consciousness and headaches.  Endo/Heme/Allergies: Negative for environmental allergies and polydipsia. Does not bruise/bleed easily.  Psychiatric/Behavioral: Positive for memory loss. Negative for depression, hallucinations and substance abuse. The patient is not nervous/anxious and does not have insomnia.      Past Medical History  Diagnosis Date  . Allergic rhinitis   . 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 COPD (  chronic obstructive pulmonary disease)   . Diverticula of colon   . Right leg DVT 01/2010  . IHD (ischemic heart disease)   . Hyperlipidemia   . Hypertension   . Depression   . Myalgia   . Hypothyroidism   . Bronchiectasis   . Spinal stenosis   . History of heartburn   . Joint pain   . 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 Coronary artery disease   . Pneumonia   . Arthritis   . CHF (congestive heart failure)   . Impacted cerumen 12/21/2012  . Arthralgia of temporomandibular joint 12/21/2012  . Contusion of hand(s) 10/07/2012  . Personal history of fall 10/07/2012  . Other manifestations of vitamin A deficiency 08/20/2012  . Anemia, unspecified 10/.01/2012  . Essential and other  specified forms of tremor 08/20/2012  . Other emphysema 08/20/2012  . Muscle weakness (generalized) 10/.01/2012  . Kyphosis (acquired) (postural) 08/20/2012  . Abnormality of gait 08/20/2012  . Edema 08/20/2012  . Rash and other nonspecific skin eruption 10.01/2012  . Nonspecific abnormal results of pulmonary system function study 08/20/2012  . Dysphagia, unspecified(787.20) 08/20/2012  . Nonspecific abnormal results of pulmonary system function study 08/20/2012  . Candidiasis of skin and nails 08/17/2012  . Memory loss 07/13/2012  . Hyposmolality and/or hyponatremia 07/08/2012  . Hypopotassemia 07/08/2012  . Acute posthemorrhagic anemia 07/08/2012  . Major depressive disorder, single episode, unspecified 07/08/2012  . Atrial fibrillation 07/08/2012  . Hypotension, unspecified 07/08/2012  . Allergic rhinitis due to pollen 07/08/2012  . Bronchiectasis without acute exacerbation 07/08/2012  . Chronic airway obstruction, not elsewhere classified 07/08/2012  . Reflux esophagitis 07/08/2012  . Spinal stenosis, unspecified region other than cervical 07/08/2012  . Edema 07/08/2012  . Chest pain, unspecified 07/08/2012  . Transient ischemic attack (TIA), and cerebral infarction without residual deficits(V12.54) 07/08/2012   Past Surgical History  Procedure Laterality Date  . Cardiac catheterization  06/13/2000    normal. ef 60%  . Cardiovascular stress test  04/2010    ef 54%, and normal perfusion  . Coronary angioplasty  1991    1st diagonal  . Carpal tunnel release      right  . Rotator cuff repair      left shoulder  . Knee surgery  August  2006    S/P right knee replacement  . Laminectomy      2005  . Cataract extraction      Bilateral    Social History:   reports that he quit smoking about 60 years ago. His smoking use included Cigarettes. He has a 25 pack-year smoking history. He has never used smokeless tobacco. He reports that he does not drink alcohol or use illicit  drugs.  Family History  Problem Relation Age of Onset  . Heart disease Father   . Heart failure Father   . Breast cancer Maternal Aunt     Medications: Patient's Medications  New Prescriptions   No medications on file  Previous Medications   ACETAMINOPHEN (TYLENOL) 500 MG TABLET    Take 500 mg by mouth 2 (two) times daily. pain   ALBUTEROL (PROVENTIL) (5 MG/ML) 0.5% NEBULIZER SOLUTION    Take 0.5 mLs (2.5 mg total) by nebulization 4 (four) times daily.   ASPIRIN EC 81 MG TABLET    Take 81 mg by mouth every morning. Take 1 tablet daily to prevent heart attack and stroke.   DEMECLOCYCLINE (DECLOMYCIN) 300 MG TABLET  Take 300 mg by mouth 2 (two) times daily.   DILTIAZEM (DILACOR XR) 180 MG 24 HR CAPSULE    Take 180 mg by mouth every morning.   FAMOTIDINE (PEPCID) 20 MG TABLET    Take 20 mg by mouth at bedtime. Take 1 tablet once daily.   FEEDING SUPPLEMENT (ENSURE) PUDG    Take 1 Container by mouth 3 (three) times daily between meals.   FERROUS SULFATE 325 (65 FE) MG TABLET    Take 325 mg by mouth daily with lunch.   FLUTICASONE (FLONASE) 50 MCG/ACT NASAL SPRAY    Place 2 sprays into the nose 2 (two) times daily. Two sprays in each nostril, 2 times daily.   FLUTICASONE-SALMETEROL (ADVAIR) 250-50 MCG/DOSE AEPB    Inhale 1 puff into the lungs every 12 (twelve) hours. Take 1 puff twice daily for COPD.   GUAIFENESIN (MUCINEX) 600 MG 12 HR TABLET    Take 1,200 mg by mouth 2 (two) times daily.   LEVOTHYROXINE (SYNTHROID, LEVOTHROID) 50 MCG TABLET    Take 50 mcg by mouth every morning. Take 1 tablet once daily for thyroid.   MEMANTINE HCL ER (NAMENDA XR) 28 MG CP24    Take 1 capsule by mouth every morning.   MOMETASONE-FORMOTEROL (DULERA) 100-5 MCG/ACT AERO    Inhale 2 puffs into the lungs 2 (two) times daily.   MULTIPLE VITAMINS-MINERALS (PRESERVISION AREDS) CAPS    Take 1 capsule by mouth daily with lunch. Take 1 tablet once daily in the evening.   NITROGLYCERIN (NITROSTAT) 0.4 MG SL TABLET     Place 0.4 mg under the tongue every 5 (five) minutes as needed. Take 1 tablet sublingual at onset of chest pain, repeat in 5 minutes, May repeat x 3 in 15 minutes.  Modified Medications   No medications on file  Discontinued Medications   No medications on file     Physical Exam: Physical Exam  Constitutional: He is oriented to person, place, and time. He appears well-developed and well-nourished. No distress.  HENT:  Head: Normocephalic and atraumatic.  Right Ear: External ear normal.  Nose: Nose normal.  Mouth/Throat: Oropharynx is clear and moist. No oropharyngeal exudate.  Eyes: Conjunctivae and EOM are normal. Pupils are equal, round, and reactive to light. Right eye exhibits no discharge. Left eye exhibits no discharge. No scleral icterus.  Neck: Normal range of motion. Neck supple. No JVD present. No tracheal deviation present. No thyromegaly present.  Cardiovascular: Normal rate.  An irregular rhythm present. Exam reveals no gallop and no friction rub.   No murmur heard. Pulmonary/Chest: Effort normal. He has decreased breath sounds. He has no wheezes. He has no rales. He exhibits no tenderness.  Abdominal: Soft. Bowel sounds are normal. He exhibits no distension. There is no tenderness. There is no rebound.  Musculoskeletal: Normal range of motion. He exhibits edema (trace in the left ankle. ) and tenderness (left knee. ).  Lymphadenopathy:    He has no cervical adenopathy.  Neurological: He is alert and oriented to person, place, and time. He has normal reflexes. No cranial nerve deficit. He exhibits normal muscle tone. Coordination normal.  Skin: Skin is warm and dry. No rash noted. He is not diaphoretic. No erythema. No pallor.  Psychiatric: He has a normal mood and affect. His behavior is normal. Judgment and thought content normal. Cognition and memory are impaired. He exhibits abnormal recent memory and abnormal remote memory.    Filed Vitals:   09/01/13 1332  BP:  134/74  Pulse: 68  Temp: 97.3 F (36.3 C)  TempSrc: Tympanic  Resp: 18      Labs reviewed: Basic Metabolic Panel:  Recent Labs  11/91/47 1652  04/11/13 0449 04/13/13 0422 04/14/13 0600 04/19/13 04/27/13 05/25/13 08/03/13  NA  --   < > 132* 125* 125* 123*  --  129* 131*  K  --   < > 3.8 4.2 4.3 4.4  --  4.1 4.3  CL  --   < > 101 94* 94*  --   --   --   --   CO2  --   < > 23 23 23   --   --   --   --   GLUCOSE  --   < > 94 98 92  --   --   --   --   BUN  --   < > 12 11 10 16   --  17 13  CREATININE  --   < > 1.13 1.08 1.00 1.0  --  1.1 1.1  CALCIUM  --   < > 8.4 8.4 8.4  --   --   --   --   MG 2.0  --   --   --   --   --   --   --   --   PHOS 3.0  --   --   --   --   --   --   --   --   TSH  --   < >  --   --   --   --  3.72  --   --   < > = values in this interval not displayed. Liver Function Tests:  Recent Labs  04/02/13 2235 04/10/13 0440 04/13/13 0422  AST 14 12 13   ALT 8 6 6   ALKPHOS 57 49 53  BILITOT 0.4 0.3 0.2*  PROT 7.5 6.2 6.6  ALBUMIN 3.1* 2.2* 2.3*   CBC:  Recent Labs  04/02/13 2235 04/09/13 1652  04/11/13 0449 04/13/13 0422 04/14/13 0600 04/19/13 05/25/13 08/03/13  WBC 9.5 7.4  < > 5.9 5.8 6.3 7.1 5.6 6.0  NEUTROABS 6.3 5.1  --   --   --   --   --   --   --   HGB 10.0* 9.8*  < > 8.4* 8.4* 8.6* 8.5* 8.5* 8.8*  HCT 29.7* 28.0*  < > 25.0* 23.9* 24.0* 24* 95* 26*  MCV 98.0 97.6  < > 100.4* 98.0 95.6  --   --   --   PLT 202 264  < > 269 328 381 367 235 273  < > = values in this interval not displayed.   Past Procedures: 06/22/13 X-ray left knee: moderate to advanced OA most prominent at the medial compartment and patellofemoral joint space, small degenerative spur at the superior patella., no acute fracture, subluxation, dislocation, or lytic destructive lesion, small knee effusion.   08/03/13 CXR no cardiomegaly or pulmonary vascular congestion, old granulomatous disease, patchy pneumonitis/inflammatory consolidate at the right upper lung and left  mid/lower lung improved in the interval.   Assessment/Plan A-fib Rate controlled on Diltiazem 180mg             BRONCHIECTASIS W/O ACUTE EXACERBATION Chronic cough and phlegm production.     COPD Maintained on Advair and Mucinex. Chronic cough. CXR 08/03/13 showed patchy pneumonitis/oinflammatory consolidate at the right upper lung and left mid/lower lung improved. Last treated with 2 week course Levaquin was 08/10/13.  Unspecified hypothyroidism Takes Levothyroxine daily,TSH 3.715 04/27/13            Hyponatremia Stable on Demeclocycline, last Na 129 05/25/13, 131 08/03/13        Edema Trace in his left ankle,  stopped taking Lasix upon discharge, monitor wt and resume as indicated. Weights are stable in the past 3 months-#174            Dementia Presently taking Namenda and able to converse w/o behavioral problems.                            Gait instability W/c for mobility and ambulates with walker to/from meals.     GERD Stable on Famotidine 20mg  daily.     Anemia Hgb 8s-8.8 08/03/13, off Iron 325mg              Family/ Staff Communication: observe the patient  Goals of Care: SNF  Labs/tests ordered: CXR, CBC, BMP done 08/03/13

## 2013-09-01 NOTE — Assessment & Plan Note (Signed)
W/c for mobility and ambulates with walker to/from meals.

## 2013-09-22 ENCOUNTER — Non-Acute Institutional Stay (SKILLED_NURSING_FACILITY): Payer: Medicare Other | Admitting: Nurse Practitioner

## 2013-09-22 DIAGNOSIS — M25569 Pain in unspecified knee: Secondary | ICD-10-CM

## 2013-09-22 DIAGNOSIS — K219 Gastro-esophageal reflux disease without esophagitis: Secondary | ICD-10-CM

## 2013-09-22 DIAGNOSIS — E871 Hypo-osmolality and hyponatremia: Secondary | ICD-10-CM

## 2013-09-22 DIAGNOSIS — D649 Anemia, unspecified: Secondary | ICD-10-CM

## 2013-09-22 DIAGNOSIS — J479 Bronchiectasis, uncomplicated: Secondary | ICD-10-CM

## 2013-09-22 DIAGNOSIS — I4891 Unspecified atrial fibrillation: Secondary | ICD-10-CM

## 2013-09-22 DIAGNOSIS — E039 Hypothyroidism, unspecified: Secondary | ICD-10-CM

## 2013-09-22 DIAGNOSIS — M25562 Pain in left knee: Secondary | ICD-10-CM

## 2013-09-22 DIAGNOSIS — F0390 Unspecified dementia without behavioral disturbance: Secondary | ICD-10-CM

## 2013-09-23 NOTE — Assessment & Plan Note (Signed)
Rate controlled on Diltiazem 180mg                     

## 2013-09-23 NOTE — Assessment & Plan Note (Addendum)
06/22/13 X-ray left knee: moderate to advanced OA most prominent at the medial compartment and patellofemoral joint space, small degenerative spur at the superior patella., no acute fracture, subluxation, dislocation, or lytic destructive lesion, small knee effusion. Lidoderm patch eased off his pain-dc it since its not adequate in managing his pain, will reduce Meloxicam to daily due to c/o sick stomach and questionable pain relief of the left knee pain, takes Tylenol 650mg  bid also. W/c for mobility.

## 2013-09-23 NOTE — Assessment & Plan Note (Signed)
Presently taking Namenda and able to converse w/o behavioral problems.                                   

## 2013-09-23 NOTE — Assessment & Plan Note (Signed)
Hgb 8s-8.8 08/03/13, off Iron 07/12/13 by Dr. Jacky Kindle, will update CBC

## 2013-09-23 NOTE — Assessment & Plan Note (Signed)
Takes Levothyroxine daily,TSH 3.715 04/27/13, update TSH

## 2013-09-23 NOTE — Assessment & Plan Note (Signed)
Chronic cough and phlegm production. Takes Advair and prn DuoNeb.

## 2013-09-23 NOTE — Progress Notes (Signed)
Patient ID: [name], male   DOB: 02/10/1924, 79 y.o.   MRN: [id number]  Code Status: DNR  Allergies  Allergen Reactions  . Sulfonamide Derivatives Other (See Comments)    unknown  . Penicillins Rash    Chief Complaint  Patient presents with  . Medical Managment of Chronic Issues    anemia, sick stomach in am ac breakfast.     HPI: Patient is a 79 y.o. male seen in the SNF at [facility] today for evaluation of sick stomach in am, anemia, hyponatremia, and  chronic medical conditions.  Problem List Items Addressed This Visit   A-fib     Rate controlled on Diltiazem 180mg                 Anemia - Primary     Hgb 8s-8.8 08/03/13, off Iron 07/12/13 by Dr. [name], will update CBC              BRONCHIECTASIS W/O ACUTE EXACERBATION     Chronic cough and phlegm production. Takes Advair and prn DuoNeb.         Dementia     Presently taking Namenda and able to converse w/o behavioral problems.                                GERD     C/o nausea and sick stomach in am prior to breakfast, denied abd pain or tarry stools, will increase Famotidine to 20mg  bid and add Carafate 1gm po ac breakfast. Decrease Meloxicam to daily. Observe the patient.         Relevant Medications      sucralfate (CARAFATE) 1 G tablet   Hyponatremia (Chronic)     Stable on Demeclocycline 300mg  daily reduced per Dr. [name] 07/12/13, last Na 129 05/25/13, 131 08/03/13. Update CMP            Left knee pain     06/22/13 X-ray left knee: moderate to advanced OA most prominent at the medial compartment and patellofemoral joint space, small degenerative spur at the superior patella., no acute fracture, subluxation, dislocation, or lytic destructive lesion, small knee effusion. Lidoderm patch eased off his pain-dc it since its not adequate in managing his pain, will reduce Meloxicam to daily due to c/o sick stomach and questionable pain relief of the  left knee pain, takes Tylenol 650mg  bid also. W/c for mobility.         Unspecified hypothyroidism     Takes Levothyroxine daily,TSH 3.715 04/27/13, update TSH                   Review of Systems:  Review of Systems  Constitutional: Negative for fever, chills, weight loss and diaphoresis.  HENT: Positive for hearing loss. Negative for congestion, ear discharge, ear pain and sore throat.   Eyes: Negative for blurred vision, double vision, photophobia, pain, discharge and redness.  Respiratory: Positive for cough. Negative for hemoptysis, sputum production, shortness of breath and wheezing.   Cardiovascular: Positive for leg swelling and PND. Negative for chest pain, palpitations, orthopnea and claudication.  Gastrointestinal: Positive for nausea. Negative for heartburn, vomiting, abdominal pain, diarrhea, constipation and blood in stool.       In am ac breakfast.   Genitourinary: Positive for frequency. Negative for dysuria, urgency, hematuria and flank pain.  Musculoskeletal: Positive for joint pain (left knee pain ). Negative for back pain, falls, myalgias and  neck pain.  Skin: Negative for itching and rash.  Neurological: Positive for weakness. Negative for dizziness, tingling, tremors, sensory change, speech change, focal weakness, seizures, loss of consciousness and headaches.  Endo/Heme/Allergies: Negative for environmental allergies and polydipsia. Does not bruise/bleed easily.  Psychiatric/Behavioral: Positive for memory loss. Negative for depression, hallucinations and substance abuse. The patient is not nervous/anxious and does not have insomnia.      Past Medical History  Diagnosis Date  . Allergic rhinitis   . 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 COPD (chronic obstructive pulmonary disease)   . Diverticula of colon   . Right leg DVT 01/2010  . IHD (ischemic heart disease)   . Hyperlipidemia   . Hypertension   . Depression   . Myalgia    . Hypothyroidism   . Bronchiectasis   . Spinal stenosis   . History of heartburn   . Joint pain   . 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 Coronary artery disease   . Pneumonia   . Arthritis   . CHF (congestive heart failure)   . Impacted cerumen 12/21/2012  . Arthralgia of temporomandibular joint 12/21/2012  . Contusion of hand(s) 10/07/2012  . Personal history of fall 10/07/2012  . Other manifestations of vitamin A deficiency 08/20/2012  . Anemia, unspecified 10/.01/2012  . Essential and other specified forms of tremor 08/20/2012  . Other emphysema 08/20/2012  . Muscle weakness (generalized) 10/.01/2012  . Kyphosis (acquired) (postural) 08/20/2012  . Abnormality of gait 08/20/2012  . Edema 08/20/2012  . Rash and other nonspecific skin eruption 10.01/2012  . Nonspecific abnormal results of pulmonary system function study 08/20/2012  . Dysphagia, unspecified(787.20) 08/20/2012  . Nonspecific abnormal results of pulmonary system function study 08/20/2012  . Candidiasis of skin and nails 08/17/2012  . Memory loss 07/13/2012  . Hyposmolality and/or hyponatremia 07/08/2012  . Hypopotassemia 07/08/2012  . Acute posthemorrhagic anemia 07/08/2012  . Major depressive disorder, single episode, unspecified 07/08/2012  . Atrial fibrillation 07/08/2012  . Hypotension, unspecified 07/08/2012  . Allergic rhinitis due to pollen 07/08/2012  . Bronchiectasis without acute exacerbation 07/08/2012  . Chronic airway obstruction, not elsewhere classified 07/08/2012  . Reflux esophagitis 07/08/2012  . Spinal stenosis, unspecified region other than cervical 07/08/2012  . Edema 07/08/2012  . Chest pain, unspecified 07/08/2012  . Transient ischemic attack (TIA), and cerebral infarction without residual deficits(V12.54) 07/08/2012   Past Surgical History  Procedure Laterality Date  . Cardiac catheterization  06/13/2000    normal. ef 60%  . Cardiovascular stress test  04/2010     ef 54%, and normal perfusion  . Coronary angioplasty  1991    1st diagonal  . Carpal tunnel release      right  . Rotator cuff repair      left shoulder  . Knee surgery  August  2006    S/P right knee replacement  . Laminectomy      2005  . Cataract extraction      Bilateral    Social History:   reports that he quit smoking about 60 years ago. His smoking use included Cigarettes. He has a 25 pack-year smoking history. He has never used smokeless tobacco. He reports that he does not drink alcohol or use illicit drugs.  Family History  Problem Relation Age of Onset  . Heart disease Father   . Heart failure Father   . Breast cancer Maternal Aunt     Medications: Patient's  Medications  New Prescriptions   No medications on file  Previous Medications   ACETAMINOPHEN (TYLENOL) 500 MG TABLET    Take 500 mg by mouth 2 (two) times daily. pain   ALBUTEROL (PROVENTIL) (5 MG/ML) 0.5% NEBULIZER SOLUTION    Take 0.5 mLs (2.5 mg total) by nebulization 4 (four) times daily.   ASPIRIN EC 81 MG TABLET    Take 81 mg by mouth every morning. Take 1 tablet daily to prevent heart attack and stroke.   DEMECLOCYCLINE (DECLOMYCIN) 300 MG TABLET    Take 300 mg by mouth 2 (two) times daily.   DILTIAZEM (DILACOR XR) 180 MG 24 HR CAPSULE    Take 180 mg by mouth every morning.   FAMOTIDINE (PEPCID) 20 MG TABLET    Take 20 mg by mouth 2 (two) times daily. Take 1 tablet once daily.   FEEDING SUPPLEMENT (ENSURE) PUDG    Take 1 Container by mouth 3 (three) times daily between meals.   FLUTICASONE (FLONASE) 50 MCG/ACT NASAL SPRAY    Place 2 sprays into the nose 2 (two) times daily. Two sprays in each nostril, 2 times daily.   FLUTICASONE-SALMETEROL (ADVAIR) 250-50 MCG/DOSE AEPB    Inhale 1 puff into the lungs every 12 (twelve) hours. Take 1 puff twice daily for COPD.   GUAIFENESIN (MUCINEX) 600 MG 12 HR TABLET    Take 1,200 mg by mouth 2 (two) times daily.   LEVOTHYROXINE (SYNTHROID, LEVOTHROID) 50 MCG TABLET     Take 50 mcg by mouth every morning. Take 1 tablet once daily for thyroid.   MEMANTINE HCL ER (NAMENDA XR) 28 MG CP24    Take 1 capsule by mouth every morning.   MOMETASONE-FORMOTEROL (DULERA) 100-5 MCG/ACT AERO    Inhale 2 puffs into the lungs 2 (two) times daily.   MULTIPLE VITAMINS-MINERALS (PRESERVISION AREDS) CAPS    Take 1 capsule by mouth daily with lunch. Take 1 tablet once daily in the evening.   NITROGLYCERIN (NITROSTAT) 0.4 MG SL TABLET    Place 0.4 mg under the tongue every 5 (five) minutes as needed. Take 1 tablet sublingual at onset of chest pain, repeat in 5 minutes, May repeat x 3 in 15 minutes.   SUCRALFATE (CARAFATE) 1 G TABLET    Take 1 g by mouth daily before breakfast.  Modified Medications   No medications on file  Discontinued Medications   FERROUS SULFATE 325 (65 FE) MG TABLET    Take 325 mg by mouth daily with lunch.     Physical Exam: Physical Exam  Constitutional: He is oriented to person, place, and time. He appears well-developed and well-nourished. No distress.  HENT:  Head: Normocephalic and atraumatic.  Right Ear: External ear normal.  Nose: Nose normal.  Mouth/Throat: Oropharynx is clear and moist. No oropharyngeal exudate.  Eyes: Conjunctivae and EOM are normal. Pupils are equal, round, and reactive to light. Right eye exhibits no discharge. Left eye exhibits no discharge. No scleral icterus.  Neck: Normal range of motion. Neck supple. No JVD present. No tracheal deviation present. No thyromegaly present.  Cardiovascular: Normal rate.  An irregular rhythm present. Exam reveals no gallop and no friction rub.   No murmur heard. Pulmonary/Chest: Effort normal. He has decreased breath sounds. He has no wheezes. He has no rales. He exhibits no tenderness.  Abdominal: Soft. Bowel sounds are normal. He exhibits no distension. There is no tenderness. There is no rebound.  Musculoskeletal: Normal range of motion. He exhibits edema (trace in the  left ankle. ) and  tenderness (left knee. ).  Lymphadenopathy:    He has no cervical adenopathy.  Neurological: He is alert and oriented to person, place, and time. He has normal reflexes. No cranial nerve deficit. He exhibits normal muscle tone. Coordination normal.  Skin: Skin is warm and dry. No rash noted. He is not diaphoretic. No erythema. No pallor.  Psychiatric: He has a normal mood and affect. His behavior is normal. Judgment and thought content normal. Cognition and memory are impaired. He exhibits abnormal recent memory and abnormal remote memory.    Filed Vitals:   09/23/13 1021  BP: 125/68  Pulse: 72  Temp: 97.1 F (36.2 C)  TempSrc: Tympanic  Resp: 18      Labs reviewed: Basic Metabolic Panel:  Recent Labs  45/40/98 1652  04/11/13 0449 04/13/13 0422 04/14/13 0600 04/19/13 04/27/13 05/25/13 08/03/13  NA  --   < > 132* 125* 125* 123*  --  129* 131*  K  --   < > 3.8 4.2 4.3 4.4  --  4.1 4.3  CL  --   < > 101 94* 94*  --   --   --   --   CO2  --   < > 23 23 23   --   --   --   --   GLUCOSE  --   < > 94 98 92  --   --   --   --   BUN  --   < > 12 11 10 16   --  17 13  CREATININE  --   < > 1.13 1.08 1.00 1.0  --  1.1 1.1  CALCIUM  --   < > 8.4 8.4 8.4  --   --   --   --   MG 2.0  --   --   --   --   --   --   --   --   PHOS 3.0  --   --   --   --   --   --   --   --   TSH  --   < >  --   --   --   --  3.72  --   --   < > = values in this interval not displayed. Liver Function Tests:  Recent Labs  04/02/13 2235 04/10/13 0440 04/13/13 0422  AST 14 12 13   ALT 8 6 6   ALKPHOS 57 49 53  BILITOT 0.4 0.3 0.2*  PROT 7.5 6.2 6.6  ALBUMIN 3.1* 2.2* 2.3*   CBC:  Recent Labs  04/02/13 2235 04/09/13 1652  04/11/13 0449 04/13/13 0422 04/14/13 0600 04/19/13 05/25/13 08/03/13  WBC 9.5 7.4  < > 5.9 5.8 6.3 7.1 5.6 6.0  NEUTROABS 6.3 5.1  --   --   --   --   --   --   --   HGB 10.0* 9.8*  < > 8.4* 8.4* 8.6* 8.5* 8.5* 8.8*  HCT 29.7* 28.0*  < > 25.0* 23.9* 24.0* 24* 95* 26*  MCV  98.0 97.6  < > 100.4* 98.0 95.6  --   --   --   PLT 202 264  < > 269 328 381 367 235 273  < > = values in this interval not displayed.   Past Procedures: 06/22/13 X-ray left knee: moderate to advanced OA most prominent at the medial compartment and patellofemoral joint space, small degenerative spur at the superior  patella., no acute fracture, subluxation, dislocation, or lytic destructive lesion, small knee effusion.   08/03/13 CXR no cardiomegaly or pulmonary vascular congestion, old granulomatous disease, patchy pneumonitis/inflammatory consolidate at the right upper lung and left mid/lower lung improved in the interval.   Assessment/Plan Anemia Hgb 8s-8.8 08/03/13, off Iron 07/12/13 by Dr. [name], will update CBC            Unspecified hypothyroidism Takes Levothyroxine daily,TSH 3.715 04/27/13, update TSH              Left knee pain 06/22/13 X-ray left knee: moderate to advanced OA most prominent at the medial compartment and patellofemoral joint space, small degenerative spur at the superior patella., no acute fracture, subluxation, dislocation, or lytic destructive lesion, small knee effusion. Lidoderm patch eased off his pain-dc it since its not adequate in managing his pain, will reduce Meloxicam to daily due to c/o sick stomach and questionable pain relief of the left knee pain, takes Tylenol 650mg  bid also. W/c for mobility.       Hyponatremia Stable on Demeclocycline 300mg  daily reduced per Dr. [name] 07/12/13, last Na 129 05/25/13, 131 08/03/13. Update CMP          A-fib Rate controlled on Diltiazem 180mg               BRONCHIECTASIS W/O ACUTE EXACERBATION Chronic cough and phlegm production. Takes Advair and prn DuoNeb.       Dementia Presently taking Namenda and able to converse w/o behavioral problems.                              GERD C/o nausea and sick stomach in am prior to breakfast, denied  abd pain or tarry stools, will increase Famotidine to 20mg  bid and add Carafate 1gm po ac breakfast. Decrease Meloxicam to daily. Observe the patient.         Family/ Staff Communication: observe the patient  Goals of Care: SNF  Labs/tests ordered: CBC, TSH, CMP

## 2013-09-23 NOTE — Assessment & Plan Note (Signed)
Stable on Demeclocycline 300mg  daily reduced per Dr. Jacky Kindle 07/12/13, last Na 129 05/25/13, 131 08/03/13. Update CMP

## 2013-09-23 NOTE — Assessment & Plan Note (Signed)
C/o nausea and sick stomach in am prior to breakfast, denied abd pain or tarry stools, will increase Famotidine to 20mg  bid and add Carafate 1gm po ac breakfast. Decrease Meloxicam to daily. Observe the patient.

## 2013-09-30 LAB — CBC AND DIFFERENTIAL
HCT: 26 % — AB (ref 41–53)
Hemoglobin: 8.7 g/dL — AB (ref 13.5–17.5)
Platelets: 236 10*3/uL (ref 150–399)

## 2013-09-30 LAB — HEPATIC FUNCTION PANEL
ALT: 8 U/L — AB (ref 10–40)
AST: 12 U/L — AB (ref 14–40)
Alkaline Phosphatase: 46 U/L (ref 25–125)
Bilirubin, Total: 0.3 mg/dL

## 2013-09-30 LAB — TSH: TSH: 2.38 u[IU]/mL (ref 0.41–5.90)

## 2013-09-30 LAB — BASIC METABOLIC PANEL: Sodium: 135 mmol/L — AB (ref 137–147)

## 2013-10-20 ENCOUNTER — Non-Acute Institutional Stay (SKILLED_NURSING_FACILITY): Payer: Medicare Other | Admitting: Nurse Practitioner

## 2013-10-20 ENCOUNTER — Encounter: Payer: Self-pay | Admitting: Nurse Practitioner

## 2013-10-20 DIAGNOSIS — E871 Hypo-osmolality and hyponatremia: Secondary | ICD-10-CM

## 2013-10-20 DIAGNOSIS — I4891 Unspecified atrial fibrillation: Secondary | ICD-10-CM

## 2013-10-20 DIAGNOSIS — D649 Anemia, unspecified: Secondary | ICD-10-CM

## 2013-10-20 DIAGNOSIS — F0390 Unspecified dementia without behavioral disturbance: Secondary | ICD-10-CM

## 2013-10-20 DIAGNOSIS — J4489 Other specified chronic obstructive pulmonary disease: Secondary | ICD-10-CM

## 2013-10-20 DIAGNOSIS — R609 Edema, unspecified: Secondary | ICD-10-CM

## 2013-10-20 DIAGNOSIS — E039 Hypothyroidism, unspecified: Secondary | ICD-10-CM

## 2013-10-20 DIAGNOSIS — M25562 Pain in left knee: Secondary | ICD-10-CM

## 2013-10-20 DIAGNOSIS — I10 Essential (primary) hypertension: Secondary | ICD-10-CM

## 2013-10-20 DIAGNOSIS — K219 Gastro-esophageal reflux disease without esophagitis: Secondary | ICD-10-CM

## 2013-10-20 DIAGNOSIS — J449 Chronic obstructive pulmonary disease, unspecified: Secondary | ICD-10-CM

## 2013-10-20 DIAGNOSIS — M25569 Pain in unspecified knee: Secondary | ICD-10-CM

## 2013-10-20 NOTE — Assessment & Plan Note (Signed)
Trace in his left ankle,  stopped taking Lasix upon discharge, monitor wt and resume as indicated. Weights are stable in the past 3 months-#[id number]               

## 2013-10-20 NOTE — Assessment & Plan Note (Signed)
Takes Levothyroxine 50mcg daily,TSH 3.715 04/27/13, 2.379 09/30/13                 

## 2013-10-20 NOTE — Assessment & Plan Note (Signed)
Rate controlled on Diltiazem 180mg                     

## 2013-10-20 NOTE — Assessment & Plan Note (Signed)
Presently taking Namenda and able to converse w/o behavioral problems.                                   

## 2013-10-20 NOTE — Assessment & Plan Note (Signed)
06/22/13 X-ray left knee: moderate to advanced OA most prominent at the medial compartment and patellofemoral joint space, small degenerative spur at the superior patella., no acute fracture, subluxation, dislocation, or lytic destructive lesion, small knee effusion. Lidoderm patch eased off his pain-dc it since its not adequate in managing his pain, no change since Meloxicam was reduced to daily due to c/o sick stomach and questionable pain relief of the left knee pain, takes Tylenol 650mg  bid also. W/c for mobility.

## 2013-10-20 NOTE — Assessment & Plan Note (Signed)
Chronic cough and phlegm production. Takes Advair and prn DuoNeb.      

## 2013-10-20 NOTE — Assessment & Plan Note (Signed)
C/o nausea and sick stomach in am prior to breakfast-better with  Famotidine to 20mg  bid and added Carafate 1gm po ac breakfast. Decreased Meloxicam to daily. Observe the patient.

## 2013-10-20 NOTE — Assessment & Plan Note (Addendum)
Hgb 8s-8.8 08/03/13, off Iron 07/12/13 by Dr. Jacky Kindle, Hgb 8.7 09/30/13, will update CBC and anemia panel.

## 2013-10-20 NOTE — Progress Notes (Signed)
Patient ID: [name], male   DOB: [birth year], 79 y.o.   MRN: [id number]   Code Status: DNR  Allergies  Allergen Reactions  . Sulfonamide Derivatives Other (See Comments)    unknown  . Penicillins Rash    Chief Complaint  Patient presents with  . Medical Managment of Chronic Issues    HPI: Patient is a 79 y.o. male seen in the SNF at [facility] today for evaluation of chronic medical conditions.  Problem List Items Addressed This Visit   Unspecified hypothyroidism     Takes Levothyroxine daily,TSH 3.715 04/27/13, 2.379 09/30/13                  Left knee pain     06/22/13 X-ray left knee: moderate to advanced OA most prominent at the medial compartment and patellofemoral joint space, small degenerative spur at the superior patella., no acute fracture, subluxation, dislocation, or lytic destructive lesion, small knee effusion. Lidoderm patch eased off his pain-dc it since its not adequate in managing his pain, no change since Meloxicam was reduced to daily due to c/o sick stomach and questionable pain relief of the left knee pain, takes Tylenol 650mg  bid also. W/c for mobility.           Hyponatremia - Primary (Chronic)     Stable on Demeclocycline 300mg  daily reduced per Dr. Jacky Kindle 07/12/13, last Na 129 05/25/13, 131 08/03/13, 135 09/30/13. Decrease Demeclocycline to 150mg  daily. Update CMP in one month              HTN (hypertension)     Some low measurements 100s/50s--the patient is asymptomatic, only takes Cardizem 180mg  for A-fib--may have to decrease it if the patient becomes symptomatic-continue to observe.           GERD     C/o nausea and sick stomach in am prior to breakfast-better with  Famotidine to 20mg  bid and added Carafate 1gm po ac breakfast. Decreased Meloxicam to daily. Observe the patient.           Edema     Trace in his left ankle,  stopped taking Lasix upon discharge, monitor wt and resume as indicated.  Weights are stable in the past 3 months-#174                Dementia     Presently taking Namenda and able to converse w/o behavioral problems.                                COPD     Chronic cough and phlegm production. Takes Advair and prn DuoNeb.           Anemia     Hgb 8s-8.8 08/03/13, off Iron 07/12/13 by Dr. Jacky Kindle, Hgb 8.7 09/30/13, will update CBC and anemia panel.                 A-fib     Rate controlled on Diltiazem 180mg                      Review of Systems:  Review of Systems  Constitutional: Negative for fever, chills, weight loss and diaphoresis.  HENT: Positive for hearing loss. Negative for congestion, ear discharge, ear pain and sore throat.   Eyes: Negative for blurred vision, double vision, photophobia, pain, discharge and redness.  Respiratory: Positive for cough. Negative for hemoptysis, sputum production, shortness of  breath and wheezing.   Cardiovascular: Positive for leg swelling and PND. Negative for chest pain, palpitations, orthopnea and claudication.  Gastrointestinal: Positive for nausea. Negative for heartburn, vomiting, abdominal pain, diarrhea, constipation and blood in stool.       In am ac breakfast.   Genitourinary: Positive for frequency. Negative for dysuria, urgency, hematuria and flank pain.  Musculoskeletal: Positive for joint pain (left knee pain ). Negative for back pain, falls, myalgias and neck pain.  Skin: Negative for itching and rash.  Neurological: Positive for weakness. Negative for dizziness, tingling, tremors, sensory change, speech change, focal weakness, seizures, loss of consciousness and headaches.  Endo/Heme/Allergies: Negative for environmental allergies and polydipsia. Does not bruise/bleed easily.  Psychiatric/Behavioral: Positive for memory loss. Negative for depression, hallucinations and substance abuse. The patient is not nervous/anxious and does not  have insomnia.      Past Medical History  Diagnosis Date  . Allergic rhinitis   . 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 COPD (chronic obstructive pulmonary disease)   . Diverticula of colon   . Right leg DVT 01/2010  . IHD (ischemic heart disease)   . Hyperlipidemia   . Hypertension   . Depression   . Myalgia   . Hypothyroidism   . Bronchiectasis   . Spinal stenosis   . History of heartburn   . Joint pain   . 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 Coronary artery disease   . Pneumonia   . Arthritis   . CHF (congestive heart failure)   . Impacted cerumen 12/21/2012  . Arthralgia of temporomandibular joint 12/21/2012  . Contusion of hand(s) 10/07/2012  . Personal history of fall 10/07/2012  . Other manifestations of vitamin A deficiency 08/20/2012  . Anemia, unspecified 10/.01/2012  . Essential and other specified forms of tremor 08/20/2012  . Other emphysema 08/20/2012  . Muscle weakness (generalized) 10/.01/2012  . Kyphosis (acquired) (postural) 08/20/2012  . Abnormality of gait 08/20/2012  . Edema 08/20/2012  . Rash and other nonspecific skin eruption 10.01/2012  . Nonspecific abnormal results of pulmonary system function study 08/20/2012  . Dysphagia, unspecified(787.20) 08/20/2012  . Nonspecific abnormal results of pulmonary system function study 08/20/2012  . Candidiasis of skin and nails 08/17/2012  . Memory loss 07/13/2012  . Hyposmolality and/or hyponatremia 07/08/2012  . Hypopotassemia 07/08/2012  . Acute posthemorrhagic anemia 07/08/2012  . Major depressive disorder, single episode, unspecified 07/08/2012  . Atrial fibrillation 07/08/2012  . Hypotension, unspecified 07/08/2012  . Allergic rhinitis due to pollen 07/08/2012  . Bronchiectasis without acute exacerbation 07/08/2012  . Chronic airway obstruction, not elsewhere classified 07/08/2012  . Reflux esophagitis 07/08/2012  . Spinal stenosis, unspecified region other  than cervical 07/08/2012  . Edema 07/08/2012  . Chest pain, unspecified 07/08/2012  . Transient ischemic attack (TIA), and cerebral infarction without residual deficits(V12.54) 07/08/2012   Past Surgical History  Procedure Laterality Date  . Cardiac catheterization  06/13/2000    normal. ef 60%  . Cardiovascular stress test  04/2010    ef 54%, and normal perfusion  . Coronary angioplasty  1991    1st diagonal  . Carpal tunnel release      right  . Rotator cuff repair      left shoulder  . Knee surgery  August  2006    S/P right knee replacement  . Laminectomy      2005  . Cataract extraction      Bilateral  Social History:   reports that he quit smoking about 60 years ago. His smoking use included Cigarettes. He has a 25 pack-year smoking history. He has never used smokeless tobacco. He reports that he does not drink alcohol or use illicit drugs.  Family History  Problem Relation Age of Onset  . Heart disease Father   . Heart failure Father   . Breast cancer Maternal Aunt     Medications: Patient's Medications  New Prescriptions   No medications on file  Previous Medications   ACETAMINOPHEN (TYLENOL) 500 MG TABLET    Take 500 mg by mouth 2 (two) times daily. pain   ALBUTEROL (PROVENTIL) (5 MG/ML) 0.5% NEBULIZER SOLUTION    Take 0.5 mLs (2.5 mg total) by nebulization 4 (four) times daily.   ASPIRIN EC 81 MG TABLET    Take 81 mg by mouth every morning. Take 1 tablet daily to prevent heart attack and stroke.   DEMECLOCYCLINE (DECLOMYCIN) 300 MG TABLET    Take 150 mg by mouth daily.    DILTIAZEM (DILACOR XR) 180 MG 24 HR CAPSULE    Take 180 mg by mouth every morning.   FAMOTIDINE (PEPCID) 20 MG TABLET    Take 20 mg by mouth 2 (two) times daily. Take 1 tablet once daily.   FEEDING SUPPLEMENT (ENSURE) PUDG    Take 1 Container by mouth 3 (three) times daily between meals.   FLUTICASONE (FLONASE) 50 MCG/ACT NASAL SPRAY    Place 2 sprays into the nose 2 (two) times daily. Two  sprays in each nostril, 2 times daily.   FLUTICASONE-SALMETEROL (ADVAIR) 250-50 MCG/DOSE AEPB    Inhale 1 puff into the lungs every 12 (twelve) hours. Take 1 puff twice daily for COPD.   GUAIFENESIN (MUCINEX) 600 MG 12 HR TABLET    Take 1,200 mg by mouth 2 (two) times daily.   LEVOTHYROXINE (SYNTHROID, LEVOTHROID) 50 MCG TABLET    Take 50 mcg by mouth every morning. Take 1 tablet once daily for thyroid.   MEMANTINE HCL ER (NAMENDA XR) 28 MG CP24    Take 1 capsule by mouth every morning.   MOMETASONE-FORMOTEROL (DULERA) 100-5 MCG/ACT AERO    Inhale 2 puffs into the lungs 2 (two) times daily.   MULTIPLE VITAMINS-MINERALS (PRESERVISION AREDS) CAPS    Take 1 capsule by mouth daily with lunch. Take 1 tablet once daily in the evening.   NITROGLYCERIN (NITROSTAT) 0.4 MG SL TABLET    Place 0.4 mg under the tongue every 5 (five) minutes as needed. Take 1 tablet sublingual at onset of chest pain, repeat in 5 minutes, May repeat x 3 in 15 minutes.   SUCRALFATE (CARAFATE) 1 G TABLET    Take 1 g by mouth daily before breakfast.  Modified Medications   No medications on file  Discontinued Medications   No medications on file     Physical Exam: Physical Exam  Constitutional: He is oriented to person, place, and time. He appears well-developed and well-nourished. No distress.  HENT:  Head: Normocephalic and atraumatic.  Right Ear: External ear normal.  Nose: Nose normal.  Mouth/Throat: Oropharynx is clear and moist. No oropharyngeal exudate.  Eyes: Conjunctivae and EOM are normal. Pupils are equal, round, and reactive to light. Right eye exhibits no discharge. Left eye exhibits no discharge. No scleral icterus.  Neck: Normal range of motion. Neck supple. No JVD present. No tracheal deviation present. No thyromegaly present.  Cardiovascular: Normal rate.  An irregular rhythm present. Exam reveals no gallop  and no friction rub.   No murmur heard. Pulmonary/Chest: Effort normal. He has decreased breath  sounds. He has no wheezes. He has no rales. He exhibits no tenderness.  Abdominal: Soft. Bowel sounds are normal. He exhibits no distension. There is no tenderness. There is no rebound.  Musculoskeletal: Normal range of motion. He exhibits edema (trace in the left ankle. ) and tenderness (left knee. ).  Lymphadenopathy:    He has no cervical adenopathy.  Neurological: He is alert and oriented to person, place, and time. He has normal reflexes. No cranial nerve deficit. He exhibits normal muscle tone. Coordination normal.  Skin: Skin is warm and dry. No rash noted. He is not diaphoretic. No erythema. No pallor.  Psychiatric: He has a normal mood and affect. His behavior is normal. Judgment and thought content normal. Cognition and memory are impaired. He exhibits abnormal recent memory and abnormal remote memory.    Filed Vitals:   10/20/13 1236  BP: 132/76  Pulse: 81  Temp: 97.1 F (36.2 C)  TempSrc: Tympanic  Resp: 20      Labs reviewed: Basic Metabolic Panel:  Recent Labs  98/11/91 1652  04/11/13 0449 04/13/13 0422 04/14/13 0600  04/19/13 04/27/13 05/25/13 08/03/13 09/30/13  NA  --   < > 132* 125* 125*  < > 123*  --  129* 131* 135*  K  --   < > 3.8 4.2 4.3  --  4.4  --  4.1 4.3 4.3  CL  --   < > 101 94* 94*  --   --   --   --   --   --   CO2  --   < > 23 23 23   --   --   --   --   --   --   GLUCOSE  --   < > 94 98 92  --   --   --   --   --   --   BUN  --   < > 12 11 10   < > 16  --  17 13 21   CREATININE  --   < > 1.13 1.08 1.00  < > 1.0  --  1.1 1.1 1.0  CALCIUM  --   < > 8.4 8.4 8.4  --   --   --   --   --   --   MG 2.0  --   --   --   --   --   --   --   --   --   --   PHOS 3.0  --   --   --   --   --   --   --   --   --   --   TSH  --   < >  --   --   --   --   --  3.72  --   --  2.38  < > = values in this interval not displayed. Liver Function Tests:  Recent Labs  04/02/13 2235 04/10/13 0440 04/13/13 0422 09/30/13  AST 14 12 13  12*  ALT 8 6 6  8*  ALKPHOS 57  49 53 46  BILITOT 0.4 0.3 0.2*  --   PROT 7.5 6.2 6.6  --   ALBUMIN 3.1* 2.2* 2.3*  --    CBC:  Recent Labs  04/02/13 2235 04/09/13 1652  04/11/13 0449 04/13/13 0422 04/14/13 0600  05/25/13 08/03/13 09/30/13  WBC  9.5 7.4  < > 5.9 5.8 6.3  < > 5.6 6.0 4.0  NEUTROABS 6.3 5.1  --   --   --   --   --   --   --   --   HGB 10.0* 9.8*  < > 8.4* 8.4* 8.6*  < > 8.5* 8.8* 8.7*  HCT 29.7* 28.0*  < > 25.0* 23.9* 24.0*  < > 95* 26* 26*  MCV 98.0 97.6  < > 100.4* 98.0 95.6  --   --   --   --   PLT 202 264  < > 269 328 381  < > 235 273 236  < > = values in this interval not displayed.  Past Procedures:  08/03/13 CXR no cardiomegaly or pulmonary vascular congestion, old granulomatous disease, patchy pneumonitis/inflammatory consolidate at the right upper lung and left mid/lower lung improved in the interval.    Assessment/Plan Dementia Presently taking Namenda and able to converse w/o behavioral problems.                              A-fib Rate controlled on Diltiazem 180mg                 Anemia Hgb 8s-8.8 08/03/13, off Iron 07/12/13 by Dr. Jacky Kindle, Hgb 8.7 09/30/13, will update CBC and anemia panel.               COPD Chronic cough and phlegm production. Takes Advair and prn DuoNeb.         Edema Trace in his left ankle,  stopped taking Lasix upon discharge, monitor wt and resume as indicated. Weights are stable in the past 3 months-#174              GERD C/o nausea and sick stomach in am prior to breakfast-better with  Famotidine to 20mg  bid and added Carafate 1gm po ac breakfast. Decreased Meloxicam to daily. Observe the patient.         HTN (hypertension) Some low measurements 100s/50s--the patient is asymptomatic, only takes Cardizem 180mg  for A-fib--may have to decrease it if the patient becomes symptomatic-continue to observe.         Hyponatremia Stable on Demeclocycline 300mg  daily reduced per  Dr. Jacky Kindle 07/12/13, last Na 129 05/25/13, 131 08/03/13, 135 09/30/13. Decrease Demeclocycline to 150mg  daily. Update CMP in one month            Left knee pain 06/22/13 X-ray left knee: moderate to advanced OA most prominent at the medial compartment and patellofemoral joint space, small degenerative spur at the superior patella., no acute fracture, subluxation, dislocation, or lytic destructive lesion, small knee effusion. Lidoderm patch eased off his pain-dc it since its not adequate in managing his pain, no change since Meloxicam was reduced to daily due to c/o sick stomach and questionable pain relief of the left knee pain, takes Tylenol 650mg  bid also. W/c for mobility.         Unspecified hypothyroidism Takes Levothyroxine daily,TSH 3.715 04/27/13, 2.379 09/30/13                  Family/ Staff Communication: observe the patient  Goals of Care: SNF  Labs/tests ordered: BMP and CBC and anemia panel in one month.

## 2013-10-20 NOTE — Assessment & Plan Note (Signed)
Some low measurements 100s/50s--the patient is asymptomatic, only takes Cardizem 180mg  for A-fib--may have to decrease it if the patient becomes symptomatic-continue to observe.

## 2013-10-20 NOTE — Assessment & Plan Note (Signed)
Stable on Demeclocycline 300mg  daily reduced per Dr. Jacky Kindle 07/12/13, last Na 129 05/25/13, 131 08/03/13, 135 09/30/13. Decrease Demeclocycline to 150mg  daily. Update CMP in one month

## 2013-10-27 ENCOUNTER — Encounter: Payer: Self-pay | Admitting: Nurse Practitioner

## 2013-10-27 ENCOUNTER — Non-Acute Institutional Stay (SKILLED_NURSING_FACILITY): Payer: Medicare Other | Admitting: Nurse Practitioner

## 2013-10-27 DIAGNOSIS — M25562 Pain in left knee: Secondary | ICD-10-CM

## 2013-10-27 DIAGNOSIS — K219 Gastro-esophageal reflux disease without esophagitis: Secondary | ICD-10-CM

## 2013-10-27 DIAGNOSIS — M25569 Pain in unspecified knee: Secondary | ICD-10-CM

## 2013-10-27 DIAGNOSIS — F0390 Unspecified dementia without behavioral disturbance: Secondary | ICD-10-CM

## 2013-10-27 DIAGNOSIS — D649 Anemia, unspecified: Secondary | ICD-10-CM

## 2013-10-27 DIAGNOSIS — F329 Major depressive disorder, single episode, unspecified: Secondary | ICD-10-CM

## 2013-10-27 DIAGNOSIS — F0393 Unspecified dementia, unspecified severity, with mood disturbance: Secondary | ICD-10-CM

## 2013-10-27 DIAGNOSIS — F0280 Dementia in other diseases classified elsewhere without behavioral disturbance: Secondary | ICD-10-CM

## 2013-10-27 DIAGNOSIS — J479 Bronchiectasis, uncomplicated: Secondary | ICD-10-CM

## 2013-10-27 DIAGNOSIS — I10 Essential (primary) hypertension: Secondary | ICD-10-CM

## 2013-10-27 DIAGNOSIS — I4891 Unspecified atrial fibrillation: Secondary | ICD-10-CM

## 2013-10-27 DIAGNOSIS — E871 Hypo-osmolality and hyponatremia: Secondary | ICD-10-CM

## 2013-10-27 NOTE — Assessment & Plan Note (Signed)
Flat affect and lack of motives-will try Cymbalta in setting of chronic joint/muscle pains.

## 2013-10-27 NOTE — Assessment & Plan Note (Signed)
06/22/13 X-ray left knee: moderate to advanced OA most prominent at the medial compartment and patellofemoral joint space, small degenerative spur at the superior patella., no acute fracture, subluxation, dislocation, or lytic destructive lesion, small knee effusion. Lidoderm patch eased off his pain-dc it since its not adequate in managing his pain, no change since Meloxicam was reduced to daily due to c/o sick stomach and questionable pain relief of the left knee pain, takes Tylenol 650mg bid also. W/c for mobility.        

## 2013-10-27 NOTE — Assessment & Plan Note (Signed)
Hgb 8s-8.8 08/03/13, off Iron 07/12/13 by Dr. Jacky Kindle, Hgb 8.7 09/30/13. Iron 58, B12 348, Folate 9.8, Erythropoietin 20.1-chronic diease in nature.

## 2013-10-27 NOTE — Assessment & Plan Note (Signed)
Chronic cough and phlegm production. Takes Advair, Mucinex 1200mg bid, and prn DuoNeb    

## 2013-10-27 NOTE — Assessment & Plan Note (Signed)
Rate controlled on Diltiazem 180mg                     

## 2013-10-27 NOTE — Assessment & Plan Note (Signed)
C/o nausea and sick stomach in am prior to breakfast-better with  Famotidine to 20mg  bid and added Carafate 1gm po ac breakfast-better since decreased Meloxicam to daily. Off Carafate since 10/25/13.

## 2013-10-27 NOTE — Assessment & Plan Note (Signed)
Stable on Demeclocycline 300mg  daily reduced per Dr. Jacky Kindle 07/12/13, last Na 129 05/25/13, 131 08/03/13, 135 09/30/13. Decreased Demeclocycline to 150mg  daily. Update CMP in one month

## 2013-10-27 NOTE — Assessment & Plan Note (Signed)
Controlled, takes Cardizem 180mg for A-fib  

## 2013-10-27 NOTE — Progress Notes (Signed)
Patient ID: [name], male   DOB: October 26, 1924, 79 y.o.   MRN: [id number]   Code Status: DNR  Allergies  Allergen Reactions  . Sulfonamide Derivatives Other (See Comments)    unknown  . Penicillins Rash    Chief Complaint  Patient presents with  . Medical Managment of Chronic Issues    depression.   . Dementia  . Acute Visit    HPI: Patient is a 79 y.o. male seen in the SNF at [facility] today for evaluation of deression and other chronic medical conditions.  Problem List Items Addressed This Visit   Hyponatremia - Primary (Chronic)     Stable on Demeclocycline 300mg  daily reduced per Dr. Jacky Kindle 07/12/13, last Na 129 05/25/13, 131 08/03/13, 135 09/30/13. Decreased Demeclocycline to 150mg  daily. Update CMP in one month                BRONCHIECTASIS W/O ACUTE EXACERBATION     Chronic cough and phlegm production. Takes Advair, Mucinex 1200mg  bid, and prn DuoNeb            GERD     C/o nausea and sick stomach in am prior to breakfast-better with  Famotidine to 20mg  bid and added Carafate 1gm po ac breakfast-better since decreased Meloxicam to daily. Off Carafate since 10/25/13.             HTN (hypertension)     Controlled, takes Cardizem 180mg  for A-fib            A-fib     Rate controlled on Diltiazem 180mg                     Anemia     Hgb 8s-8.8 08/03/13, off Iron 07/12/13 by Dr. Jacky Kindle, Hgb 8.7 09/30/13. Iron 58, B12 348, Folate 9.8, Erythropoietin 20.1-chronic diease in nature.                   Dementia     Presently taking Namenda and able to converse w/o behavioral problems.                                  Relevant Medications      DULoxetine (CYMBALTA) 20 MG capsule   Left knee pain     06/22/13 X-ray left knee: moderate to advanced OA most prominent at the medial compartment and patellofemoral joint space, small degenerative spur at the superior patella., no  acute fracture, subluxation, dislocation, or lytic destructive lesion, small knee effusion. Lidoderm patch eased off his pain-dc it since its not adequate in managing his pain, no change since Meloxicam was reduced to daily due to c/o sick stomach and questionable pain relief of the left knee pain, takes Tylenol 650mg  bid also. W/c for mobility.             Depression due to dementia     Flat affect and lack of motives-will try Cymbalta in setting of chronic joint/muscle pains.        Review of Systems:  Review of Systems  Constitutional: Negative for fever, chills, weight loss and diaphoresis.  HENT: Positive for hearing loss. Negative for congestion, ear discharge, ear pain and sore throat.   Eyes: Negative for blurred vision, double vision, photophobia, pain, discharge and redness.  Respiratory: Positive for cough. Negative for hemoptysis, sputum production, shortness of breath and wheezing.   Cardiovascular: Positive for leg swelling and PND.  Negative for chest pain, palpitations, orthopnea and claudication.  Gastrointestinal: Positive for nausea. Negative for heartburn, vomiting, abdominal pain, diarrhea, constipation and blood in stool.       In am ac breakfast.   Genitourinary: Positive for frequency. Negative for dysuria, urgency, hematuria and flank pain.  Musculoskeletal: Positive for joint pain (left knee pain ). Negative for back pain, falls, myalgias and neck pain.  Skin: Negative for itching and rash.  Neurological: Positive for weakness. Negative for dizziness, tingling, tremors, sensory change, speech change, focal weakness, seizures, loss of consciousness and headaches.  Endo/Heme/Allergies: Negative for environmental allergies and polydipsia. Does not bruise/bleed easily.  Psychiatric/Behavioral: Positive for depression and memory loss. Negative for hallucinations and substance abuse. The patient is not nervous/anxious and does not have insomnia.        Flat affect  and lack of motives.      Past Medical History  Diagnosis Date  . Allergic rhinitis   . 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 COPD (chronic obstructive pulmonary disease)   . Diverticula of colon   . Right leg DVT 01/2010  . IHD (ischemic heart disease)   . Hyperlipidemia   . Hypertension   . Depression   . Myalgia   . Hypothyroidism   . Bronchiectasis   . Spinal stenosis   . History of heartburn   . Joint pain   . 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 Coronary artery disease   . Pneumonia   . Arthritis   . CHF (congestive heart failure)   . Impacted cerumen 12/21/2012  . Arthralgia of temporomandibular joint 12/21/2012  . Contusion of hand(s) 10/07/2012  . Personal history of fall 10/07/2012  . Other manifestations of vitamin A deficiency 08/20/2012  . Anemia, unspecified 10/.01/2012  . Essential and other specified forms of tremor 08/20/2012  . Other emphysema 08/20/2012  . Muscle weakness (generalized) 10/.01/2012  . Kyphosis (acquired) (postural) 08/20/2012  . Abnormality of gait 08/20/2012  . Edema 08/20/2012  . Rash and other nonspecific skin eruption 10.01/2012  . Nonspecific abnormal results of pulmonary system function study 08/20/2012  . Dysphagia, unspecified(787.20) 08/20/2012  . Nonspecific abnormal results of pulmonary system function study 08/20/2012  . Candidiasis of skin and nails 08/17/2012  . Memory loss 07/13/2012  . Hyposmolality and/or hyponatremia 07/08/2012  . Hypopotassemia 07/08/2012  . Acute posthemorrhagic anemia 07/08/2012  . Major depressive disorder, single episode, unspecified 07/08/2012  . Atrial fibrillation 07/08/2012  . Hypotension, unspecified 07/08/2012  . Allergic rhinitis due to pollen 07/08/2012  . Bronchiectasis without acute exacerbation 07/08/2012  . Chronic airway obstruction, not elsewhere classified 07/08/2012  . Reflux esophagitis 07/08/2012  . Spinal stenosis, unspecified region  other than cervical 07/08/2012  . Edema 07/08/2012  . Chest pain, unspecified 07/08/2012  . Transient ischemic attack (TIA), and cerebral infarction without residual deficits(V12.54) 07/08/2012   Past Surgical History  Procedure Laterality Date  . Cardiac catheterization  06/13/2000    normal. ef 60%  . Cardiovascular stress test  04/2010    ef 54%, and normal perfusion  . Coronary angioplasty  1991    1st diagonal  . Carpal tunnel release      right  . Rotator cuff repair      left shoulder  . Knee surgery  August  2006    S/P right knee replacement  . Laminectomy      2005  . Cataract extraction  Bilateral    Social History:   reports that he quit smoking about 60 years ago. His smoking use included Cigarettes. He has a 25 pack-year smoking history. He has never used smokeless tobacco. He reports that he does not drink alcohol or use illicit drugs.  Family History  Problem Relation Age of Onset  . Heart disease Father   . Heart failure Father   . Breast cancer Maternal Aunt     Medications: Patient's Medications  New Prescriptions   No medications on file  Previous Medications   ACETAMINOPHEN (TYLENOL) 500 MG TABLET    Take 500 mg by mouth 2 (two) times daily. pain   ALBUTEROL (PROVENTIL) (5 MG/ML) 0.5% NEBULIZER SOLUTION    Take 0.5 mLs (2.5 mg total) by nebulization 4 (four) times daily.   ASPIRIN EC 81 MG TABLET    Take 81 mg by mouth every morning. Take 1 tablet daily to prevent heart attack and stroke.   DEMECLOCYCLINE (DECLOMYCIN) 300 MG TABLET    Take 150 mg by mouth daily.    DILTIAZEM (DILACOR XR) 180 MG 24 HR CAPSULE    Take 180 mg by mouth every morning.   DULOXETINE (CYMBALTA) 20 MG CAPSULE    Take 20 mg by mouth daily.   FAMOTIDINE (PEPCID) 20 MG TABLET    Take 20 mg by mouth 2 (two) times daily. Take 1 tablet once daily.   FEEDING SUPPLEMENT (ENSURE) PUDG    Take 1 Container by mouth 3 (three) times daily between meals.   FLUTICASONE (FLONASE) 50  MCG/ACT NASAL SPRAY    Place 2 sprays into the nose 2 (two) times daily. Two sprays in each nostril, 2 times daily.   FLUTICASONE-SALMETEROL (ADVAIR) 250-50 MCG/DOSE AEPB    Inhale 1 puff into the lungs every 12 (twelve) hours. Take 1 puff twice daily for COPD.   GUAIFENESIN (MUCINEX) 600 MG 12 HR TABLET    Take 1,200 mg by mouth 2 (two) times daily.   LEVOTHYROXINE (SYNTHROID, LEVOTHROID) 50 MCG TABLET    Take 50 mcg by mouth every morning. Take 1 tablet once daily for thyroid.   MEMANTINE HCL ER (NAMENDA XR) 28 MG CP24    Take 1 capsule by mouth every morning.   MOMETASONE-FORMOTEROL (DULERA) 100-5 MCG/ACT AERO    Inhale 2 puffs into the lungs 2 (two) times daily.   MULTIPLE VITAMINS-MINERALS (PRESERVISION AREDS) CAPS    Take 1 capsule by mouth daily with lunch. Take 1 tablet once daily in the evening.   NITROGLYCERIN (NITROSTAT) 0.4 MG SL TABLET    Place 0.4 mg under the tongue every 5 (five) minutes as needed. Take 1 tablet sublingual at onset of chest pain, repeat in 5 minutes, May repeat x 3 in 15 minutes.  Modified Medications   No medications on file  Discontinued Medications   SUCRALFATE (CARAFATE) 1 G TABLET    Take 1 g by mouth daily before breakfast.     Physical Exam:Physical Exam  Constitutional: He is oriented to person, place, and time. He appears well-developed and well-nourished. No distress.  HENT:  Head: Normocephalic and atraumatic.  Right Ear: External ear normal.  Nose: Nose normal.  Mouth/Throat: Oropharynx is clear and moist. No oropharyngeal exudate.  Eyes: Conjunctivae and EOM are normal. Pupils are equal, round, and reactive to light. Right eye exhibits no discharge. Left eye exhibits no discharge. No scleral icterus.  Neck: Normal range of motion. Neck supple. No JVD present. No tracheal deviation present. No thyromegaly present.  Cardiovascular: Normal rate.  An irregular rhythm present. Exam reveals no gallop and no friction rub.   No murmur  heard. Pulmonary/Chest: Effort normal. He has decreased breath sounds. He has no wheezes. He has no rales. He exhibits no tenderness.  Abdominal: Soft. Bowel sounds are normal. He exhibits no distension. There is no tenderness. There is no rebound.  Musculoskeletal: Normal range of motion. He exhibits edema (trace in the left ankle. ) and tenderness (left knee. ).  Lymphadenopathy:    He has no cervical adenopathy.  Neurological: He is alert and oriented to person, place, and time. He has normal reflexes. No cranial nerve deficit. He exhibits normal muscle tone. Coordination normal.  Skin: Skin is warm and dry. No rash noted. He is not diaphoretic. No erythema. No pallor.  Psychiatric: Judgment and thought content normal. His speech is not delayed. He is slowed and withdrawn. Cognition and memory are impaired. He exhibits a depressed mood. He exhibits abnormal recent memory and abnormal remote memory.    Ceasar Mons Vitals:   10/27/13 1330  BP: 125/68  Pulse: 72  Temp: 97.1 F (36.2 C)  TempSrc: Tympanic  Resp: 16      Labs reviewed: Basic Metabolic Panel:  Recent Labs  60/45/40 1652  04/11/13 0449 04/13/13 0422 04/14/13 0600  04/19/13 04/27/13 05/25/13 08/03/13 09/30/13  NA  --   < > 132* 125* 125*  < > 123*  --  129* 131* 135*  K  --   < > 3.8 4.2 4.3  --  4.4  --  4.1 4.3 4.3  CL  --   < > 101 94* 94*  --   --   --   --   --   --   CO2  --   < > 23 23 23   --   --   --   --   --   --   GLUCOSE  --   < > 94 98 92  --   --   --   --   --   --   BUN  --   < > 12 11 10   < > 16  --  17 13 21   CREATININE  --   < > 1.13 1.08 1.00  < > 1.0  --  1.1 1.1 1.0  CALCIUM  --   < > 8.4 8.4 8.4  --   --   --   --   --   --   MG 2.0  --   --   --   --   --   --   --   --   --   --   PHOS 3.0  --   --   --   --   --   --   --   --   --   --   TSH  --   < >  --   --   --   --   --  3.72  --   --  2.38  < > = values in this interval not displayed. Liver Function Tests:  Recent Labs   04/02/13 2235 04/10/13 0440 04/13/13 0422 09/30/13  AST 14 12 13  12*  ALT 8 6 6  8*  ALKPHOS 57 49 53 46  BILITOT 0.4 0.3 0.2*  --   PROT 7.5 6.2 6.6  --   ALBUMIN 3.1* 2.2* 2.3*  --    CBC:  Recent Labs  04/02/13 2235 04/09/13 1652  04/11/13 0449 04/13/13 0422 04/14/13 0600  05/25/13 08/03/13 09/30/13  WBC 9.5 7.4  < > 5.9 5.8 6.3  < > 5.6 6.0 4.0  NEUTROABS 6.3 5.1  --   --   --   --   --   --   --   --   HGB 10.0* 9.8*  < > 8.4* 8.4* 8.6*  < > 8.5* 8.8* 8.7*  HCT 29.7* 28.0*  < > 25.0* 23.9* 24.0*  < > 95* 26* 26*  MCV 98.0 97.6  < > 100.4* 98.0 95.6  --   --   --   --   PLT 202 264  < > 269 328 381  < > 235 273 236  < > = values in this interval not displayed.  Past Procedures:  08/03/13 CXR no cardiomegaly or pulmonary vascular congestion, old granulomatous disease, patchy pneumonitis/inflammatory consolidate at the right upper lung and left mid/lower lung improved in the interval  Assessment/Plan Hyponatremia Stable on Demeclocycline 300mg  daily reduced per Dr. Jacky Kindle 07/12/13, last Na 129 05/25/13, 131 08/03/13, 135 09/30/13. Decreased Demeclocycline to 150mg  daily. Update CMP in one month              GERD C/o nausea and sick stomach in am prior to breakfast-better with  Famotidine to 20mg  bid and added Carafate 1gm po ac breakfast-better since decreased Meloxicam to daily. Off Carafate since 10/25/13.           HTN (hypertension) Controlled, takes Cardizem 180mg  for A-fib          A-fib Rate controlled on Diltiazem 180mg                   Anemia Hgb 8s-8.8 08/03/13, off Iron 07/12/13 by Dr. Jacky Kindle, Hgb 8.7 09/30/13. Iron 58, B12 348, Folate 9.8, Erythropoietin 20.1-chronic diease in nature.                 Left knee pain 06/22/13 X-ray left knee: moderate to advanced OA most prominent at the medial compartment and patellofemoral joint space, small degenerative spur at the superior patella., no acute fracture,  subluxation, dislocation, or lytic destructive lesion, small knee effusion. Lidoderm patch eased off his pain-dc it since its not adequate in managing his pain, no change since Meloxicam was reduced to daily due to c/o sick stomach and questionable pain relief of the left knee pain, takes Tylenol 650mg  bid also. W/c for mobility.           BRONCHIECTASIS W/O ACUTE EXACERBATION Chronic cough and phlegm production. Takes Advair, Mucinex 1200mg  bid, and prn DuoNeb          Dementia Presently taking Namenda and able to converse w/o behavioral problems.                                Depression due to dementia Flat affect and lack of motives-will try Cymbalta in setting of chronic joint/muscle pains.     Family/ Staff Communication: observe the patient  Goals of Care: SNF  Labs/tests ordered: BMP one month.

## 2013-10-27 NOTE — Assessment & Plan Note (Signed)
Presently taking Namenda and able to converse w/o behavioral problems.                                   

## 2013-11-23 LAB — CBC AND DIFFERENTIAL
HCT: 27 % — AB (ref 41–53)
HEMOGLOBIN: 9.3 g/dL — AB (ref 13.5–17.5)
Platelets: 277 10*3/uL (ref 150–399)
WBC: 7 10^3/mL

## 2013-11-23 LAB — BASIC METABOLIC PANEL
BUN: 25 mg/dL — AB (ref 4–21)
Creatinine: 1.2 mg/dL (ref 0.6–1.3)
Glucose: 81 mg/dL
Potassium: 4.2 mmol/L (ref 3.4–5.3)
Sodium: 134 mmol/L — AB (ref 137–147)

## 2013-11-29 ENCOUNTER — Encounter: Payer: Self-pay | Admitting: Nurse Practitioner

## 2013-11-29 ENCOUNTER — Non-Acute Institutional Stay (SKILLED_NURSING_FACILITY): Payer: Medicare Other | Admitting: Nurse Practitioner

## 2013-11-29 DIAGNOSIS — M25569 Pain in unspecified knee: Secondary | ICD-10-CM

## 2013-11-29 DIAGNOSIS — E039 Hypothyroidism, unspecified: Secondary | ICD-10-CM

## 2013-11-29 DIAGNOSIS — L02414 Cutaneous abscess of left upper limb: Secondary | ICD-10-CM | POA: Insufficient documentation

## 2013-11-29 DIAGNOSIS — F0393 Unspecified dementia, unspecified severity, with mood disturbance: Secondary | ICD-10-CM

## 2013-11-29 DIAGNOSIS — IMO0002 Reserved for concepts with insufficient information to code with codable children: Secondary | ICD-10-CM

## 2013-11-29 DIAGNOSIS — K219 Gastro-esophageal reflux disease without esophagitis: Secondary | ICD-10-CM

## 2013-11-29 DIAGNOSIS — I10 Essential (primary) hypertension: Secondary | ICD-10-CM

## 2013-11-29 DIAGNOSIS — F0390 Unspecified dementia without behavioral disturbance: Secondary | ICD-10-CM

## 2013-11-29 DIAGNOSIS — F329 Major depressive disorder, single episode, unspecified: Secondary | ICD-10-CM

## 2013-11-29 DIAGNOSIS — M25562 Pain in left knee: Secondary | ICD-10-CM

## 2013-11-29 DIAGNOSIS — F0280 Dementia in other diseases classified elsewhere without behavioral disturbance: Secondary | ICD-10-CM

## 2013-11-29 DIAGNOSIS — I4891 Unspecified atrial fibrillation: Secondary | ICD-10-CM

## 2013-11-29 DIAGNOSIS — E871 Hypo-osmolality and hyponatremia: Secondary | ICD-10-CM

## 2013-11-29 DIAGNOSIS — J449 Chronic obstructive pulmonary disease, unspecified: Secondary | ICD-10-CM

## 2013-11-29 DIAGNOSIS — F3289 Other specified depressive episodes: Secondary | ICD-10-CM

## 2013-11-29 DIAGNOSIS — D649 Anemia, unspecified: Secondary | ICD-10-CM

## 2013-11-29 NOTE — Assessment & Plan Note (Signed)
Flat affect and lack of motives-failed Cymbalta in setting of chronic joint/muscle pains-will try Mirtazapine 7.5mg  qhs.

## 2013-11-29 NOTE — Progress Notes (Signed)
Patient ID: [name], male   DOB: [birth year], 80 y.o.   MRN: [id number]   Code Status: DNR  Allergies  Allergen Reactions  . Sulfonamide Derivatives Other (See Comments)    unknown  . Penicillins Rash    Chief Complaint  Patient presents with  . Medical Managment of Chronic Issues    poor appetite, left upper arm folliculitis  . Acute Visit    HPI: Patient is a 80 y.o. male seen in the SNF at [facility] today for evaluation of poor appetite and other chronic medical conditions.  Problem List Items Addressed This Visit   Hyponatremia - Primary (Chronic)     Na 134 11/23/13. Dc Demeclocycline to 150mg  daily. Update CMP in 2 weejs                  COPD     Chronic cough and phlegm production. Takes Advair and prn DuoNeb and daily Mucinex.             GERD     C/o nausea and sick stomach in am prior to breakfast-better with  Famotidine to 20mg  bid               HTN (hypertension)     Controlled, takes Cardizem 180mg  for A-fib              A-fib     Rate controlled on Diltiazem 180mg                       Anemia     Hgb 8s-8.8 08/03/13, off Iron 07/12/13 by Dr. Reynaldo Minium, Hgb 8.7 09/30/13. Iron 58, B12 348, Folate 9.8, Erythropoietin 20.1-chronic diease in nature. Improved. Last Hgb 9.3 11/23/13, update CBC in 2weeks.                     Unspecified hypothyroidism     Takes Levothyroxine 48mcg daily,TSH 3.715 04/27/13, 2.379 09/30/13                    Dementia     Presently taking Namenda and able to converse w/o behavioral problems.                                    Relevant Medications      mirtazapine (REMERON) 7.5 MG tablet   Left knee pain     06/22/13 X-ray left knee: moderate to advanced OA most prominent at the medial compartment and patellofemoral joint space, small degenerative spur at the superior patella., no acute fracture,  subluxation, dislocation, or lytic destructive lesion, small knee effusion. Lidoderm patch eased off his pain-dc it since its not adequate in managing his pain, dc Meloxicam 2nd to questionable pain relief of the left knee pain, takes Tylenol 650mg  bid also. W/c for mobility.               Depression due to dementia     Flat affect and lack of motives-failed Cymbalta in setting of chronic joint/muscle pains-will try Mirtazapine 7.5mg  qhs.       Abscess of left arm     I+D, apply Bactroban daily until healed.        Review of Systems:  Review of Systems  Constitutional: Negative for fever, chills, weight loss and diaphoresis.  HENT: Positive for hearing loss. Negative for congestion, ear discharge, ear pain and sore throat.  Eyes: Negative for blurred vision, double vision, photophobia, pain, discharge and redness.  Respiratory: Positive for cough. Negative for hemoptysis, sputum production, shortness of breath and wheezing.   Cardiovascular: Positive for leg swelling and PND. Negative for chest pain, palpitations, orthopnea and claudication.  Gastrointestinal: Positive for nausea. Negative for heartburn, vomiting, abdominal pain, diarrhea, constipation and blood in stool.       In am ac breakfast.   Genitourinary: Positive for frequency. Negative for dysuria, urgency, hematuria and flank pain.  Musculoskeletal: Positive for joint pain (left knee pain ). Negative for back pain, falls, myalgias and neck pain.  Skin: Negative for itching and rash.       Left upper arm carbuncle-2 follicles involved-drained  Neurological: Positive for weakness. Negative for dizziness, tingling, tremors, sensory change, speech change, focal weakness, seizures, loss of consciousness and headaches.  Endo/Heme/Allergies: Negative for environmental allergies and polydipsia. Does not bruise/bleed easily.  Psychiatric/Behavioral: Positive for depression and memory loss. Negative for hallucinations and  substance abuse. The patient is not nervous/anxious and does not have insomnia.        Flat affect and lack of motives.      Past Medical History  Diagnosis Date  . Allergic rhinitis   . 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 COPD (chronic obstructive pulmonary disease)   . Diverticula of colon   . Right leg DVT 01/2010  . IHD (ischemic heart disease)   . Hyperlipidemia   . Hypertension   . Depression   . Myalgia   . Hypothyroidism   . Bronchiectasis   . Spinal stenosis   . History of heartburn   . Joint pain   . 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 Coronary artery disease   . Pneumonia   . Arthritis   . CHF (congestive heart failure)   . Impacted cerumen 12/21/2012  . Arthralgia of temporomandibular joint 12/21/2012  . Contusion of hand(s) 10/07/2012  . Personal history of fall 10/07/2012  . Other manifestations of vitamin A deficiency 08/20/2012  . Anemia, unspecified 10/.01/2012  . Essential and other specified forms of tremor 08/20/2012  . Other emphysema 08/20/2012  . Muscle weakness (generalized) 10/.01/2012  . Kyphosis (acquired) (postural) 08/20/2012  . Abnormality of gait 08/20/2012  . Edema 08/20/2012  . Rash and other nonspecific skin eruption 10.01/2012  . Nonspecific abnormal results of pulmonary system function study 08/20/2012  . Dysphagia, unspecified(787.20) 08/20/2012  . Nonspecific abnormal results of pulmonary system function study 08/20/2012  . Candidiasis of skin and nails 08/17/2012  . Memory loss 07/13/2012  . Hyposmolality and/or hyponatremia 07/08/2012  . Hypopotassemia 07/08/2012  . Acute posthemorrhagic anemia 07/08/2012  . Major depressive disorder, single episode, unspecified 07/08/2012  . Atrial fibrillation 07/08/2012  . Hypotension, unspecified 07/08/2012  . Allergic rhinitis due to pollen 07/08/2012  . Bronchiectasis without acute exacerbation 07/08/2012  . Chronic airway obstruction, not elsewhere  classified 07/08/2012  . Reflux esophagitis 07/08/2012  . Spinal stenosis, unspecified region other than cervical 07/08/2012  . Edema 07/08/2012  . Chest pain, unspecified 07/08/2012  . Transient ischemic attack (TIA), and cerebral infarction without residual deficits(V12.54) 07/08/2012   Past Surgical History  Procedure Laterality Date  . Cardiac catheterization  06/13/2000    normal. ef 60%  . Cardiovascular stress test  04/2010    ef 54%, and normal perfusion  . Coronary angioplasty  1991    1st diagonal  . Carpal tunnel release  right  . Rotator cuff repair      left shoulder  . Knee surgery  August  2006    S/P right knee replacement  . Laminectomy      2005  . Cataract extraction      Bilateral    Social History:   reports that he quit smoking about 61 years ago. His smoking use included Cigarettes. He has a 25 pack-year smoking history. He has never used smokeless tobacco. He reports that he does not drink alcohol or use illicit drugs.  Family History  Problem Relation Age of Onset  . Heart disease Father   . Heart failure Father   . Breast cancer Maternal Aunt     Medications: Patient's Medications  New Prescriptions   No medications on file  Previous Medications   ACETAMINOPHEN (TYLENOL) 500 MG TABLET    Take 500 mg by mouth 2 (two) times daily. pain   ALBUTEROL (PROVENTIL) (5 MG/ML) 0.5% NEBULIZER SOLUTION    Take 0.5 mLs (2.5 mg total) by nebulization 4 (four) times daily.   ASPIRIN EC 81 MG TABLET    Take 81 mg by mouth every morning. Take 1 tablet daily to prevent heart attack and stroke.   DILTIAZEM (DILACOR XR) 180 MG 24 HR CAPSULE    Take 180 mg by mouth every morning.   DULOXETINE (CYMBALTA) 20 MG CAPSULE    Take 20 mg by mouth daily.   FAMOTIDINE (PEPCID) 20 MG TABLET    Take 20 mg by mouth 2 (two) times daily. Take 1 tablet once daily.   FEEDING SUPPLEMENT (ENSURE) PUDG    Take 1 Container by mouth 3 (three) times daily between meals.   FLUTICASONE  (FLONASE) 50 MCG/ACT NASAL SPRAY    Place 2 sprays into the nose 2 (two) times daily. Two sprays in each nostril, 2 times daily.   FLUTICASONE-SALMETEROL (ADVAIR) 250-50 MCG/DOSE AEPB    Inhale 1 puff into the lungs every 12 (twelve) hours. Take 1 puff twice daily for COPD.   GUAIFENESIN (MUCINEX) 600 MG 12 HR TABLET    Take 1,200 mg by mouth 2 (two) times daily.   LEVOTHYROXINE (SYNTHROID, LEVOTHROID) 50 MCG TABLET    Take 50 mcg by mouth every morning. Take 1 tablet once daily for thyroid.   MEMANTINE HCL ER (NAMENDA XR) 28 MG CP24    Take 1 capsule by mouth every morning.   MIRTAZAPINE (REMERON) 7.5 MG TABLET    Take 7.5 mg by mouth at bedtime.   MOMETASONE-FORMOTEROL (DULERA) 100-5 MCG/ACT AERO    Inhale 2 puffs into the lungs 2 (two) times daily.   MULTIPLE VITAMINS-MINERALS (PRESERVISION AREDS) CAPS    Take 1 capsule by mouth daily with lunch. Take 1 tablet once daily in the evening.   NITROGLYCERIN (NITROSTAT) 0.4 MG SL TABLET    Place 0.4 mg under the tongue every 5 (five) minutes as needed. Take 1 tablet sublingual at onset of chest pain, repeat in 5 minutes, May repeat x 3 in 15 minutes.  Modified Medications   No medications on file  Discontinued Medications   DEMECLOCYCLINE (DECLOMYCIN) 300 MG TABLET    Take 150 mg by mouth daily.      Physical Exam:Physical Exam  Constitutional: He is oriented to person, place, and time. He appears well-developed and well-nourished. No distress.  HENT:  Head: Normocephalic and atraumatic.  Right Ear: External ear normal.  Nose: Nose normal.  Mouth/Throat: Oropharynx is clear and moist. No oropharyngeal exudate.  Eyes: Conjunctivae and EOM are normal. Pupils are equal, round, and reactive to light. Right eye exhibits no discharge. Left eye exhibits no discharge. No scleral icterus.  Neck: Normal range of motion. Neck supple. No JVD present. No tracheal deviation present. No thyromegaly present.  Cardiovascular: Normal rate.  An irregular rhythm  present. Exam reveals no gallop and no friction rub.   No murmur heard. Pulmonary/Chest: Effort normal. He has decreased breath sounds. He has no wheezes. He has no rales. He exhibits no tenderness.  Abdominal: Soft. Bowel sounds are normal. He exhibits no distension. There is no tenderness. There is no rebound.  Musculoskeletal: Normal range of motion. He exhibits edema (trace in the left ankle. ) and tenderness (left knee. ).  Lymphadenopathy:    He has no cervical adenopathy.  Neurological: He is alert and oriented to person, place, and time. He has normal reflexes. No cranial nerve deficit. He exhibits normal muscle tone. Coordination normal.  Skin: Skin is warm and dry. No rash noted. He is not diaphoretic. No erythema. No pallor.  The lateral upper left arm carbuncle-2 follicles involved-drained.   Psychiatric: Judgment and thought content normal. His speech is not delayed. He is slowed and withdrawn. Cognition and memory are impaired. He exhibits a depressed mood. He exhibits abnormal recent memory and abnormal remote memory.    Danley Danker Vitals:   11/29/13 1419  BP: 100/62  Pulse: 62  Temp: 97.2 F (36.2 C)  TempSrc: Tympanic  Resp: 18      Labs reviewed: Basic Metabolic Panel:  Recent Labs  04/09/13 1652  04/11/13 0449 04/13/13 0422 04/14/13 0600 04/19/13 04/27/13  08/03/13 09/30/13 11/23/13  NA  --   < > 132* 125* 125* 123*  --   < > 131* 135* 134*  K  --   < > 3.8 4.2 4.3 4.4  --   < > 4.3 4.3 4.2  CL  --   < > 101 94* 94*  --   --   --   --   --   --   CO2  --   < > 23 23 23   --   --   --   --   --   --   GLUCOSE  --   < > 94 98 92  --   --   --   --   --   --   BUN  --   < > 12 11 10 16   --   < > 13 21 25*  CREATININE  --   < > 1.13 1.08 1.00 1.0  --   < > 1.1 1.0 1.2  CALCIUM  --   < > 8.4 8.4 8.4  --   --   --   --   --   --   MG 2.0  --   --   --   --   --   --   --   --   --   --   PHOS 3.0  --   --   --   --   --   --   --   --   --   --   TSH  --   < >   --   --   --   --  3.72  --   --  2.38  --   < > = values in this interval not displayed. Liver Function Tests:  Recent Labs  04/02/13 2235 04/10/13 0440 04/13/13 0422 09/30/13  AST 14 12 13  12*  ALT 8 6 6  8*  ALKPHOS 57 49 53 46  BILITOT 0.4 0.3 0.2*  --   PROT 7.5 6.2 6.6  --   ALBUMIN 3.1* 2.2* 2.3*  --    CBC:  Recent Labs  04/02/13 2235 04/09/13 1652  04/11/13 0449 04/13/13 0422 04/14/13 0600  08/03/13 09/30/13 11/23/13  WBC 9.5 7.4  < > 5.9 5.8 6.3  < > 6.0 4.0 7.0  NEUTROABS 6.3 5.1  --   --   --   --   --   --   --   --   HGB 10.0* 9.8*  < > 8.4* 8.4* 8.6*  < > 8.8* 8.7* 9.3*  HCT 29.7* 28.0*  < > 25.0* 23.9* 24.0*  < > 26* 26* 27*  MCV 98.0 97.6  < > 100.4* 98.0 95.6  --   --   --   --   PLT 202 264  < > 269 328 381  < > 273 236 277  < > = values in this interval not displayed.  Past Procedures:  08/03/13 CXR no cardiomegaly or pulmonary vascular congestion, old granulomatous disease, patchy pneumonitis/inflammatory consolidate at the right upper lung and left mid/lower lung improved in the interval  Assessment/Plan Hyponatremia Na 134 11/23/13. Dc Demeclocycline to 150mg  daily. Update CMP in 2 weejs                HTN (hypertension) Controlled, takes Cardizem 180mg  for A-fib            Depression due to dementia Flat affect and lack of motives-failed Cymbalta in setting of chronic joint/muscle pains-will try Mirtazapine 7.5mg  qhs.     Left knee pain 06/22/13 X-ray left knee: moderate to advanced OA most prominent at the medial compartment and patellofemoral joint space, small degenerative spur at the superior patella., no acute fracture, subluxation, dislocation, or lytic destructive lesion, small knee effusion. Lidoderm patch eased off his pain-dc it since its not adequate in managing his pain, dc Meloxicam 2nd to questionable pain relief of the left knee pain, takes Tylenol 650mg  bid also. W/c for mobility.              Anemia Hgb 8s-8.8 08/03/13, off Iron 07/12/13 by Dr. Reynaldo Minium, Hgb 8.7 09/30/13. Iron 58, B12 348, Folate 9.8, Erythropoietin 20.1-chronic diease in nature. Improved. Last Hgb 9.3 11/23/13, update CBC in 2weeks.                   Dementia Presently taking Namenda and able to converse w/o behavioral problems.                                  Unspecified hypothyroidism Takes Levothyroxine 84mcg daily,TSH 3.715 04/27/13, 2.379 09/30/13                  A-fib Rate controlled on Diltiazem 180mg                     GERD C/o nausea and sick stomach in am prior to breakfast-better with  Famotidine to 20mg  bid             COPD Chronic cough and phlegm production. Takes Advair and prn DuoNeb and daily Mucinex.           Abscess of left arm I+D, apply Bactroban daily until healed.  Family/ Staff Communication: observe the patient  Goals of Care: SNF  Labs/tests ordered: BMP and CBC 2 weeks.

## 2013-11-29 NOTE — Assessment & Plan Note (Signed)
Na 134 11/23/13. Dc Demeclocycline to 150mg  daily. Update CMP in 2 weejs

## 2013-11-29 NOTE — Assessment & Plan Note (Signed)
Chronic cough and phlegm production. Takes Advair and prn DuoNeb and daily Mucinex.            

## 2013-11-29 NOTE — Assessment & Plan Note (Signed)
Rate controlled on Diltiazem 180mg                     

## 2013-11-29 NOTE — Assessment & Plan Note (Signed)
C/o nausea and sick stomach in am prior to breakfast-better with  Famotidine to 20mg bid   

## 2013-11-29 NOTE — Assessment & Plan Note (Signed)
I+D, apply Bactroban daily until healed.

## 2013-11-29 NOTE — Assessment & Plan Note (Signed)
Takes Levothyroxine 16mcg daily,TSH 3.715 04/27/13, 2.379 09/30/13

## 2013-11-29 NOTE — Assessment & Plan Note (Signed)
Controlled, takes Cardizem 180mg for A-fib  

## 2013-11-29 NOTE — Assessment & Plan Note (Addendum)
Hgb 8s-8.8 08/03/13, off Iron 07/12/13 by Dr. Reynaldo Minium, Hgb 8.7 09/30/13. Iron 58, B12 348, Folate 9.8, Erythropoietin 20.1-chronic diease in nature. Improved. Last Hgb 9.3 11/23/13, update CBC in 2weeks.

## 2013-11-29 NOTE — Assessment & Plan Note (Signed)
06/22/13 X-ray left knee: moderate to advanced OA most prominent at the medial compartment and patellofemoral joint space, small degenerative spur at the superior patella., no acute fracture, subluxation, dislocation, or lytic destructive lesion, small knee effusion. Lidoderm patch eased off his pain-dc it since its not adequate in managing his pain, dc Meloxicam 2nd to questionable pain relief of the left knee pain, takes Tylenol 650mg  bid also. W/c for mobility.

## 2013-11-29 NOTE — Assessment & Plan Note (Signed)
Presently taking Namenda and able to converse w/o behavioral problems.                                   

## 2013-12-06 ENCOUNTER — Encounter: Payer: Self-pay | Admitting: Nurse Practitioner

## 2013-12-06 ENCOUNTER — Non-Acute Institutional Stay (SKILLED_NURSING_FACILITY): Payer: Medicare Other | Admitting: Nurse Practitioner

## 2013-12-06 DIAGNOSIS — K219 Gastro-esophageal reflux disease without esophagitis: Secondary | ICD-10-CM

## 2013-12-06 DIAGNOSIS — R609 Edema, unspecified: Secondary | ICD-10-CM

## 2013-12-06 DIAGNOSIS — F329 Major depressive disorder, single episode, unspecified: Secondary | ICD-10-CM

## 2013-12-06 DIAGNOSIS — E039 Hypothyroidism, unspecified: Secondary | ICD-10-CM

## 2013-12-06 DIAGNOSIS — F0390 Unspecified dementia without behavioral disturbance: Secondary | ICD-10-CM

## 2013-12-06 DIAGNOSIS — IMO0002 Reserved for concepts with insufficient information to code with codable children: Secondary | ICD-10-CM

## 2013-12-06 DIAGNOSIS — F0280 Dementia in other diseases classified elsewhere without behavioral disturbance: Secondary | ICD-10-CM

## 2013-12-06 DIAGNOSIS — M25569 Pain in unspecified knee: Secondary | ICD-10-CM

## 2013-12-06 DIAGNOSIS — E871 Hypo-osmolality and hyponatremia: Secondary | ICD-10-CM

## 2013-12-06 DIAGNOSIS — F3289 Other specified depressive episodes: Secondary | ICD-10-CM

## 2013-12-06 DIAGNOSIS — M25562 Pain in left knee: Secondary | ICD-10-CM

## 2013-12-06 DIAGNOSIS — I4891 Unspecified atrial fibrillation: Secondary | ICD-10-CM

## 2013-12-06 DIAGNOSIS — L03114 Cellulitis of left upper limb: Secondary | ICD-10-CM

## 2013-12-06 DIAGNOSIS — J449 Chronic obstructive pulmonary disease, unspecified: Secondary | ICD-10-CM

## 2013-12-06 DIAGNOSIS — F0393 Unspecified dementia, unspecified severity, with mood disturbance: Secondary | ICD-10-CM

## 2013-12-06 DIAGNOSIS — I10 Essential (primary) hypertension: Secondary | ICD-10-CM

## 2013-12-06 NOTE — Assessment & Plan Note (Signed)
Controlled, takes Cardizem 180mg  for A-fib

## 2013-12-06 NOTE — Assessment & Plan Note (Signed)
Rate controlled on Diltiazem 180mg 

## 2013-12-06 NOTE — Progress Notes (Signed)
Patient ID: [name], male   DOB: 30-Oct-1924, 80 y.o.   MRN: [id number]   Code Status: DNR  Allergies  Allergen Reactions  . Sulfonamide Derivatives Other (See Comments)    unknown  . Penicillins Rash    Chief Complaint  Patient presents with  . Medical Managment of Chronic Issues    left upper arm cellulitis, depression.   . Acute Visit    HPI: Patient is a 80 y.o. male seen in the SNF at [facility] today for evaluation of left upper arm cellulitis and other chronic medical conditions.  Problem List Items Addressed This Visit   A-fib     Rate controlled on Diltiazem 180mg                         Cellulitis of left upper arm - Primary     Developed from drained carbuncle-topical Bactroban ointment is not adequate, oral ABT Doxycycline 100mg  bid for 2weeks started 01/03/14-too soon to evaluate-still small amount of purulent drainage seen from open areas.     COPD     Chronic cough and phlegm production. Takes Advair and prn DuoNeb and daily Mucinex.               Dementia     Presently taking Namenda and able to converse w/o behavioral problems.                                      Depression due to dementia     Flat affect and lack of motives-failed Cymbalta in setting of chronic joint/muscle pains-dicontinued Mirtazapine 7.5mg  qhs 12/02/13 since the patient's wife POA c/o sleepiness and dis functioning after initial 2x. Cymbalta 20mg  daily was resumed 12/02/13 per POA's request.         Edema     Trace in his left ankle,  stopped taking Lasix upon discharge, monitor wt and resume as indicated. Weights are stable in the past 3 months-#174                  GERD     C/o nausea and sick stomach in am prior to breakfast-better with  Famotidine to 20mg  bid                 HTN (hypertension)     Controlled, takes Cardizem 180mg  for A-fib                Hyponatremia (Chronic)     Na 134 11/23/13. Dc Demeclocycline to 150mg  daily. Update CMP in 2 weejs                    Left knee pain     06/22/13 X-ray left knee: moderate to advanced OA most prominent at the medial compartment and patellofemoral joint space, small degenerative spur at the superior patella., no acute fracture, subluxation, dislocation, or lytic destructive lesion, small knee effusion. Lidoderm patch eased off his pain-dc it since its not adequate in managing his pain, dc Meloxicam 2nd to questionable pain relief of the left knee pain, takes Tylenol 650mg  bid also. W/c for mobility. No c/o knee pain today.                 Unspecified hypothyroidism     Takes Levothyroxine 41mcg daily,TSH 3.715 04/27/13, 2.379 09/30/13  Review of Systems:  Review of Systems  Constitutional: Negative for fever, chills, weight loss and diaphoresis.  HENT: Positive for hearing loss. Negative for congestion, ear discharge, ear pain and sore throat.   Eyes: Negative for blurred vision, double vision, photophobia, pain, discharge and redness.  Respiratory: Positive for cough. Negative for hemoptysis, sputum production, shortness of breath and wheezing.   Cardiovascular: Positive for leg swelling and PND. Negative for chest pain, palpitations, orthopnea and claudication.  Gastrointestinal: Positive for nausea. Negative for heartburn, vomiting, abdominal pain, diarrhea, constipation and blood in stool.       In am ac breakfast.   Genitourinary: Positive for frequency. Negative for dysuria, urgency, hematuria and flank pain.  Musculoskeletal: Positive for joint pain (left knee pain ). Negative for back pain, falls, myalgias and neck pain.  Skin: Negative for itching and rash.       Left upper arm carbuncle-2 follicles involved-drained-surrounding area erythema and heat  Neurological: Positive for weakness. Negative for dizziness,  tingling, tremors, sensory change, speech change, focal weakness, seizures, loss of consciousness and headaches.  Endo/Heme/Allergies: Negative for environmental allergies and polydipsia. Does not bruise/bleed easily.  Psychiatric/Behavioral: Positive for depression and memory loss. Negative for hallucinations and substance abuse. The patient is not nervous/anxious and does not have insomnia.        Flat affect and lack of motives.      Past Medical History  Diagnosis Date  . Allergic rhinitis   . 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 COPD (chronic obstructive pulmonary disease)   . Diverticula of colon   . Right leg DVT 01/2010  . IHD (ischemic heart disease)   . Hyperlipidemia   . Hypertension   . Depression   . Myalgia   . Hypothyroidism   . Bronchiectasis   . Spinal stenosis   . History of heartburn   . Joint pain   . 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 Coronary artery disease   . Pneumonia   . Arthritis   . CHF (congestive heart failure)   . Impacted cerumen 12/21/2012  . Arthralgia of temporomandibular joint 12/21/2012  . Contusion of hand(s) 10/07/2012  . Personal history of fall 10/07/2012  . Other manifestations of vitamin A deficiency 08/20/2012  . Anemia, unspecified 10/.01/2012  . Essential and other specified forms of tremor 08/20/2012  . Other emphysema 08/20/2012  . Muscle weakness (generalized) 10/.01/2012  . Kyphosis (acquired) (postural) 08/20/2012  . Abnormality of gait 08/20/2012  . Edema 08/20/2012  . Rash and other nonspecific skin eruption 10.01/2012  . Nonspecific abnormal results of pulmonary system function study 08/20/2012  . Dysphagia, unspecified(787.20) 08/20/2012  . Nonspecific abnormal results of pulmonary system function study 08/20/2012  . Candidiasis of skin and nails 08/17/2012  . Memory loss 07/13/2012  . Hyposmolality and/or hyponatremia 07/08/2012  . Hypopotassemia 07/08/2012  . Acute  posthemorrhagic anemia 07/08/2012  . Major depressive disorder, single episode, unspecified 07/08/2012  . Atrial fibrillation 07/08/2012  . Hypotension, unspecified 07/08/2012  . Allergic rhinitis due to pollen 07/08/2012  . Bronchiectasis without acute exacerbation 07/08/2012  . Chronic airway obstruction, not elsewhere classified 07/08/2012  . Reflux esophagitis 07/08/2012  . Spinal stenosis, unspecified region other than cervical 07/08/2012  . Edema 07/08/2012  . Chest pain, unspecified 07/08/2012  . Transient ischemic attack (TIA), and cerebral infarction without residual deficits(V12.54) 07/08/2012   Past Surgical History  Procedure Laterality Date  . Cardiac catheterization  06/13/2000  normal. ef 60%  . Cardiovascular stress test  04/2010    ef 54%, and normal perfusion  . Coronary angioplasty  1991    1st diagonal  . Carpal tunnel release      right  . Rotator cuff repair      left shoulder  . Knee surgery  August  2006    S/P right knee replacement  . Laminectomy      2005  . Cataract extraction      Bilateral    Social History:   reports that he quit smoking about 61 years ago. His smoking use included Cigarettes. He has a 25 pack-year smoking history. He has never used smokeless tobacco. He reports that he does not drink alcohol or use illicit drugs.  Family History  Problem Relation Age of Onset  . Heart disease Father   . Heart failure Father   . Breast cancer Maternal Aunt     Medications: Patient's Medications  New Prescriptions   No medications on file  Previous Medications   ACETAMINOPHEN (TYLENOL) 500 MG TABLET    Take 500 mg by mouth 2 (two) times daily. pain   ALBUTEROL (PROVENTIL) (5 MG/ML) 0.5% NEBULIZER SOLUTION    Take 0.5 mLs (2.5 mg total) by nebulization 4 (four) times daily.   ASPIRIN EC 81 MG TABLET    Take 81 mg by mouth every morning. Take 1 tablet daily to prevent heart attack and stroke.   DILTIAZEM (DILACOR XR) 180 MG 24 HR CAPSULE     Take 180 mg by mouth every morning.   DULOXETINE (CYMBALTA) 20 MG CAPSULE    Take 20 mg by mouth daily.   FAMOTIDINE (PEPCID) 20 MG TABLET    Take 20 mg by mouth 2 (two) times daily. Take 1 tablet once daily.   FEEDING SUPPLEMENT (ENSURE) PUDG    Take 1 Container by mouth 3 (three) times daily between meals.   FLUTICASONE (FLONASE) 50 MCG/ACT NASAL SPRAY    Place 2 sprays into the nose 2 (two) times daily. Two sprays in each nostril, 2 times daily.   FLUTICASONE-SALMETEROL (ADVAIR) 250-50 MCG/DOSE AEPB    Inhale 1 puff into the lungs every 12 (twelve) hours. Take 1 puff twice daily for COPD.   GUAIFENESIN (MUCINEX) 600 MG 12 HR TABLET    Take 1,200 mg by mouth 2 (two) times daily.   LEVOTHYROXINE (SYNTHROID, LEVOTHROID) 50 MCG TABLET    Take 50 mcg by mouth every morning. Take 1 tablet once daily for thyroid.   MEMANTINE HCL ER (NAMENDA XR) 28 MG CP24    Take 1 capsule by mouth every morning.   MOMETASONE-FORMOTEROL (DULERA) 100-5 MCG/ACT AERO    Inhale 2 puffs into the lungs 2 (two) times daily.   MULTIPLE VITAMINS-MINERALS (PRESERVISION AREDS) CAPS    Take 1 capsule by mouth daily with lunch. Take 1 tablet once daily in the evening.   NITROGLYCERIN (NITROSTAT) 0.4 MG SL TABLET    Place 0.4 mg under the tongue every 5 (five) minutes as needed. Take 1 tablet sublingual at onset of chest pain, repeat in 5 minutes, May repeat x 3 in 15 minutes.  Modified Medications   No medications on file  Discontinued Medications   MIRTAZAPINE (REMERON) 7.5 MG TABLET    Take 7.5 mg by mouth at bedtime.     Physical Exam:Physical Exam  Constitutional: He is oriented to person, place, and time. He appears well-developed and well-nourished. No distress.  HENT:  Head: Normocephalic and  atraumatic.  Right Ear: External ear normal.  Nose: Nose normal.  Mouth/Throat: Oropharynx is clear and moist. No oropharyngeal exudate.  Eyes: Conjunctivae and EOM are normal. Pupils are equal, round, and reactive to light.  Right eye exhibits no discharge. Left eye exhibits no discharge. No scleral icterus.  Neck: Normal range of motion. Neck supple. No JVD present. No tracheal deviation present. No thyromegaly present.  Cardiovascular: Normal rate.  An irregular rhythm present. Exam reveals no gallop and no friction rub.   No murmur heard. Pulmonary/Chest: Effort normal. He has decreased breath sounds. He has no wheezes. He has no rales. He exhibits no tenderness.  Abdominal: Soft. Bowel sounds are normal. He exhibits no distension. There is no tenderness. There is no rebound.  Musculoskeletal: Normal range of motion. He exhibits edema (trace in the left ankle. ) and tenderness (left knee. ).  Lymphadenopathy:    He has no cervical adenopathy.  Neurological: He is alert and oriented to person, place, and time. He has normal reflexes. No cranial nerve deficit. He exhibits normal muscle tone. Coordination normal.  Skin: Skin is warm and dry. No rash noted. He is not diaphoretic. No erythema. No pallor.  The lateral upper left arm carbuncle-2 follicles involved-drained. Peri-wound erythema and fever and swelling.   Psychiatric: Judgment and thought content normal. His speech is not delayed. He is slowed and withdrawn. Cognition and memory are impaired. He exhibits a depressed mood. He exhibits abnormal recent memory and abnormal remote memory.    Danley Danker Vitals:   12/06/13 1227  BP: 112/66  Pulse: 70  Temp: 98.3 F (36.8 C)  TempSrc: Tympanic  Resp: 18      Labs reviewed: Basic Metabolic Panel:  Recent Labs  04/09/13 1652  04/11/13 0449 04/13/13 0422 04/14/13 0600 04/19/13 04/27/13  08/03/13 09/30/13 11/23/13  NA  --   < > 132* 125* 125* 123*  --   < > 131* 135* 134*  K  --   < > 3.8 4.2 4.3 4.4  --   < > 4.3 4.3 4.2  CL  --   < > 101 94* 94*  --   --   --   --   --   --   CO2  --   < > 23 23 23   --   --   --   --   --   --   GLUCOSE  --   < > 94 98 92  --   --   --   --   --   --   BUN  --   < >  12 11 10 16   --   < > 13 21 25*  CREATININE  --   < > 1.13 1.08 1.00 1.0  --   < > 1.1 1.0 1.2  CALCIUM  --   < > 8.4 8.4 8.4  --   --   --   --   --   --   MG 2.0  --   --   --   --   --   --   --   --   --   --   PHOS 3.0  --   --   --   --   --   --   --   --   --   --   TSH  --   < >  --   --   --   --  3.72  --   --  2.38  --   < > = values in this interval not displayed. Liver Function Tests:  Recent Labs  04/02/13 2235 04/10/13 0440 04/13/13 0422 09/30/13  AST 14 12 13  12*  ALT 8 6 6  8*  ALKPHOS 57 49 53 46  BILITOT 0.4 0.3 0.2*  --   PROT 7.5 6.2 6.6  --   ALBUMIN 3.1* 2.2* 2.3*  --    CBC:  Recent Labs  04/02/13 2235 04/09/13 1652  04/11/13 0449 04/13/13 0422 04/14/13 0600  08/03/13 09/30/13 11/23/13  WBC 9.5 7.4  < > 5.9 5.8 6.3  < > 6.0 4.0 7.0  NEUTROABS 6.3 5.1  --   --   --   --   --   --   --   --   HGB 10.0* 9.8*  < > 8.4* 8.4* 8.6*  < > 8.8* 8.7* 9.3*  HCT 29.7* 28.0*  < > 25.0* 23.9* 24.0*  < > 26* 26* 27*  MCV 98.0 97.6  < > 100.4* 98.0 95.6  --   --   --   --   PLT 202 264  < > 269 328 381  < > 273 236 277  < > = values in this interval not displayed.  Past Procedures:  08/03/13 CXR no cardiomegaly or pulmonary vascular congestion, old granulomatous disease, patchy pneumonitis/inflammatory consolidate at the right upper lung and left mid/lower lung improved in the interval  Assessment/Plan Cellulitis of left upper arm Developed from drained carbuncle-topical Bactroban ointment is not adequate, oral ABT Doxycycline 100mg  bid for 2weeks started 01/03/14-too soon to evaluate-still small amount of purulent drainage seen from open areas.   Unspecified hypothyroidism Takes Levothyroxine 60mcg daily,TSH 3.715 04/27/13, 2.379 09/30/13                    Hyponatremia Na 134 11/23/13. Dc Demeclocycline to 150mg  daily. Update CMP in 2 weejs                  HTN (hypertension) Controlled, takes Cardizem 180mg  for  A-fib              GERD C/o nausea and sick stomach in am prior to breakfast-better with  Famotidine to 20mg  bid               Edema Trace in his left ankle,  stopped taking Lasix upon discharge, monitor wt and resume as indicated. Weights are stable in the past 3 months-#174                Depression due to dementia Flat affect and lack of motives-failed Cymbalta in setting of chronic joint/muscle pains-dicontinued Mirtazapine 7.5mg  qhs 12/02/13 since the patient's wife POA c/o sleepiness and dis functioning after initial 2x. Cymbalta 20mg  daily was resumed 12/02/13 per POA's request.       COPD Chronic cough and phlegm production. Takes Advair and prn DuoNeb and daily Mucinex.             A-fib Rate controlled on Diltiazem 180mg                       Dementia Presently taking Namenda and able to converse w/o behavioral problems.                                    Left knee pain 06/22/13 X-ray  left knee: moderate to advanced OA most prominent at the medial compartment and patellofemoral joint space, small degenerative spur at the superior patella., no acute fracture, subluxation, dislocation, or lytic destructive lesion, small knee effusion. Lidoderm patch eased off his pain-dc it since its not adequate in managing his pain, dc Meloxicam 2nd to questionable pain relief of the left knee pain, takes Tylenol 650mg  bid also. W/c for mobility. No c/o knee pain today.                 Family/ Staff Communication: observe the patient  Goals of Care: SNF  Labs/tests ordered: BMP and CBC 2 weeks.

## 2013-12-06 NOTE — Assessment & Plan Note (Signed)
Takes Levothyroxine 50mcg daily,TSH 3.715 04/27/13, 2.379 09/30/13                 

## 2013-12-06 NOTE — Assessment & Plan Note (Signed)
C/o nausea and sick stomach in am prior to breakfast-better with  Famotidine to 20mg  bid

## 2013-12-06 NOTE — Assessment & Plan Note (Signed)
Na 134 11/23/13. Dc Demeclocycline to 150mg daily. Update CMP in 2 weejs               

## 2013-12-06 NOTE — Assessment & Plan Note (Signed)
Presently taking Namenda and able to converse w/o behavioral problems.

## 2013-12-06 NOTE — Assessment & Plan Note (Signed)
Chronic cough and phlegm production. Takes Advair and prn DuoNeb and daily Mucinex.

## 2013-12-06 NOTE — Assessment & Plan Note (Signed)
Flat affect and lack of motives-failed Cymbalta in setting of chronic joint/muscle pains-dicontinued Mirtazapine 7.5mg qhs 12/02/13 since the patient's wife POA c/o sleepiness and dis functioning after initial 2x. Cymbalta 20mg daily was resumed 12/02/13 per POA's request.   

## 2013-12-06 NOTE — Assessment & Plan Note (Signed)
Trace in his left ankle,  stopped taking Lasix upon discharge, monitor wt and resume as indicated. Weights are stable in the past 3 months-#174

## 2013-12-06 NOTE — Assessment & Plan Note (Signed)
Developed from drained carbuncle-topical Bactroban ointment is not adequate, oral ABT Doxycycline 100mg  bid for 2weeks started 01/03/14-too soon to evaluate-still small amount of purulent drainage seen from open areas.

## 2013-12-06 NOTE — Assessment & Plan Note (Signed)
06/22/13 X-ray left knee: moderate to advanced OA most prominent at the medial compartment and patellofemoral joint space, small degenerative spur at the superior patella., no acute fracture, subluxation, dislocation, or lytic destructive lesion, small knee effusion. Lidoderm patch eased off his pain-dc it since its not adequate in managing his pain, dc Meloxicam 2nd to questionable pain relief of the left knee pain, takes Tylenol 650mg  bid also. W/c for mobility. No c/o knee pain today.

## 2013-12-14 LAB — BASIC METABOLIC PANEL
BUN: 18 mg/dL (ref 4–21)
CREATININE: 1 mg/dL (ref 0.6–1.3)
Glucose: 85 mg/dL
Potassium: 3.9 mmol/L (ref 3.4–5.3)
Sodium: 136 mmol/L — AB (ref 137–147)

## 2013-12-14 LAB — CBC AND DIFFERENTIAL
HCT: 26 % — AB (ref 41–53)
Hemoglobin: 8.7 g/dL — AB (ref 13.5–17.5)
PLATELETS: 281 10*3/uL (ref 150–399)
WBC: 6.6 10*3/mL

## 2014-01-10 ENCOUNTER — Non-Acute Institutional Stay (SKILLED_NURSING_FACILITY): Payer: Medicare Other | Admitting: Nurse Practitioner

## 2014-01-10 ENCOUNTER — Encounter: Payer: Self-pay | Admitting: Nurse Practitioner

## 2014-01-10 DIAGNOSIS — F329 Major depressive disorder, single episode, unspecified: Secondary | ICD-10-CM

## 2014-01-10 DIAGNOSIS — D649 Anemia, unspecified: Secondary | ICD-10-CM

## 2014-01-10 DIAGNOSIS — L02414 Cutaneous abscess of left upper limb: Secondary | ICD-10-CM

## 2014-01-10 DIAGNOSIS — F3289 Other specified depressive episodes: Secondary | ICD-10-CM

## 2014-01-10 DIAGNOSIS — K219 Gastro-esophageal reflux disease without esophagitis: Secondary | ICD-10-CM

## 2014-01-10 DIAGNOSIS — F0393 Unspecified dementia, unspecified severity, with mood disturbance: Secondary | ICD-10-CM

## 2014-01-10 DIAGNOSIS — I10 Essential (primary) hypertension: Secondary | ICD-10-CM

## 2014-01-10 DIAGNOSIS — F0390 Unspecified dementia without behavioral disturbance: Secondary | ICD-10-CM

## 2014-01-10 DIAGNOSIS — I4891 Unspecified atrial fibrillation: Secondary | ICD-10-CM

## 2014-01-10 DIAGNOSIS — F0280 Dementia in other diseases classified elsewhere without behavioral disturbance: Secondary | ICD-10-CM

## 2014-01-10 DIAGNOSIS — M25562 Pain in left knee: Secondary | ICD-10-CM

## 2014-01-10 DIAGNOSIS — E039 Hypothyroidism, unspecified: Secondary | ICD-10-CM

## 2014-01-10 DIAGNOSIS — J479 Bronchiectasis, uncomplicated: Secondary | ICD-10-CM

## 2014-01-10 DIAGNOSIS — M25569 Pain in unspecified knee: Secondary | ICD-10-CM

## 2014-01-10 DIAGNOSIS — IMO0002 Reserved for concepts with insufficient information to code with codable children: Secondary | ICD-10-CM

## 2014-01-10 NOTE — Assessment & Plan Note (Signed)
Flat affect and lack of motives-failed Cymbalta in setting of chronic joint/muscle pains-dicontinued Mirtazapine 7.5mg  qhs 12/02/13 since the patient's wife POA c/o sleepiness and dis functioning after initial 2x. Cymbalta 20mg  daily was resumed 12/02/13 per POA's request.

## 2014-01-10 NOTE — Assessment & Plan Note (Signed)
Resolved

## 2014-01-10 NOTE — Assessment & Plan Note (Signed)
Controlled, takes Cardizem 180mg for A-fib  

## 2014-01-10 NOTE — Assessment & Plan Note (Signed)
06/22/13 X-ray left knee: moderate to advanced OA most prominent at the medial compartment and patellofemoral joint space, small degenerative spur at the superior patella., no acute fracture, subluxation, dislocation, or lytic destructive lesion, small knee effusion. Lidoderm patch eased off his pain-dcd it since its not adequate in managing his pain, dcd Meloxicam 2nd to questionable pain relief of the left knee pain, takes Tylenol 650mg  bid also. W/c for mobility. c/o knee pain today-desires to resume Mobic 7.5mg  daily-observe the patient.

## 2014-01-10 NOTE — Assessment & Plan Note (Signed)
C/o nausea and sick stomach in am prior to breakfast-better with  Famotidine to 20mg bid   

## 2014-01-10 NOTE — Assessment & Plan Note (Signed)
Presently taking Namenda and able to converse w/o behavioral problems.                                   

## 2014-01-10 NOTE — Assessment & Plan Note (Signed)
Takes Levothyroxine 50mcg daily,TSH 3.715 04/27/13, 2.379 09/30/13                 

## 2014-01-10 NOTE — Assessment & Plan Note (Signed)
Hgb 8s-8.8 08/03/13, off Iron 07/12/13 by Dr. Reynaldo Minium, Hgb 8.7 09/30/13. Iron 58, B12 348, Folate 9.8, Erythropoietin 20.1-chronic diease in nature. Improved. Last Hgb 9.3 11/23/13, update CBC in 2weeks-showed Hgb 8.7 12/14/13 at his baseline.

## 2014-01-10 NOTE — Assessment & Plan Note (Signed)
Chronic cough and phlegm production. Takes Advair, Mucinex 1200mg bid, and prn DuoNeb    

## 2014-01-10 NOTE — Progress Notes (Signed)
Patient ID: [name], male   DOB: 02-Apr-1924, 80 y.o.   MRN: [id number]   Code Status: DNR  Allergies  Allergen Reactions  . Sulfonamide Derivatives Other (See Comments)    unknown  . Penicillins Rash    Chief Complaint  Patient presents with  . Medical Managment of Chronic Issues    knee pain.   . Acute Visit    HPI: Patient is a 80 y.o. male seen in the SNF at [facility] today for evaluation of knee pain and other chronic medical conditions.  Problem List Items Addressed This Visit   Unspecified hypothyroidism     Takes Levothyroxine 2mcg daily,TSH 3.715 04/27/13, 2.379 09/30/13     Left knee pain     06/22/13 X-ray left knee: moderate to advanced OA most prominent at the medial compartment and patellofemoral joint space, small degenerative spur at the superior patella., no acute fracture, subluxation, dislocation, or lytic destructive lesion, small knee effusion. Lidoderm patch eased off his pain-dcd it since its not adequate in managing his pain, dcd Meloxicam 2nd to questionable pain relief of the left knee pain, takes Tylenol 650mg  bid also. W/c for mobility. c/o knee pain today-desires to resume Mobic 7.5mg  daily-observe the patient.       HTN (hypertension) - Primary     Controlled, takes Cardizem 180mg  for A-fib     GERD     C/o nausea and sick stomach in am prior to breakfast-better with  Famotidine to 20mg  bid        Depression due to dementia     Flat affect and lack of motives-failed Cymbalta in setting of chronic joint/muscle pains-dicontinued Mirtazapine 7.5mg  qhs 12/02/13 since the patient's wife POA c/o sleepiness and dis functioning after initial 2x. Cymbalta 20mg  daily was resumed 12/02/13 per POA's request.      Dementia     Presently taking Namenda and able to converse w/o behavioral problems.       BRONCHIECTASIS W/O ACUTE EXACERBATION     Chronic cough and phlegm production. Takes Advair, Mucinex 1200mg  bid, and prn DuoNeb     Anemia     Hgb 8s-8.8 08/03/13, off Iron 07/12/13 by Dr. Reynaldo Minium, Hgb 8.7 09/30/13. Iron 58, B12 348, Folate 9.8, Erythropoietin 20.1-chronic diease in nature. Improved. Last Hgb 9.3 11/23/13, update CBC in 2weeks-showed Hgb 8.7 12/14/13 at his baseline.                       Abscess of left arm     Resolved.     A-fib     Controlled, takes Cardizem 180mg  for A-fib        Review of Systems:  Review of Systems  Constitutional: Negative for fever, chills, weight loss and diaphoresis.  HENT: Positive for hearing loss. Negative for congestion, ear discharge, ear pain and sore throat.   Eyes: Negative for blurred vision, double vision, photophobia, pain, discharge and redness.  Respiratory: Positive for cough. Negative for hemoptysis, sputum production, shortness of breath and wheezing.   Cardiovascular: Positive for leg swelling and PND. Negative for chest pain, palpitations, orthopnea and claudication.  Gastrointestinal: Positive for nausea. Negative for heartburn, vomiting, abdominal pain, diarrhea, constipation and blood in stool.       In am ac breakfast.   Genitourinary: Positive for frequency. Negative for dysuria, urgency, hematuria and flank pain.  Musculoskeletal: Positive for joint pain (left knee pain ). Negative for back pain, falls, myalgias and neck pain.  Knee pain.   Skin: Negative for itching and rash.  Neurological: Positive for weakness. Negative for dizziness, tingling, tremors, sensory change, speech change, focal weakness, seizures, loss of consciousness and headaches.  Endo/Heme/Allergies: Negative for environmental allergies and polydipsia. Does not bruise/bleed easily.  Psychiatric/Behavioral: Positive for depression and memory loss. Negative for hallucinations and substance abuse. The patient is not nervous/anxious and does not have insomnia.        Flat affect and lack of motives.      Past Medical History  Diagnosis Date  . Allergic  rhinitis   . 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 COPD (chronic obstructive pulmonary disease)   . Diverticula of colon   . Right leg DVT 01/2010  . IHD (ischemic heart disease)   . Hyperlipidemia   . Hypertension   . Depression   . Myalgia   . Hypothyroidism   . Bronchiectasis   . Spinal stenosis   . History of heartburn   . Joint pain   . 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 Coronary artery disease   . Pneumonia   . Arthritis   . CHF (congestive heart failure)   . Impacted cerumen 12/21/2012  . Arthralgia of temporomandibular joint 12/21/2012  . Contusion of hand(s) 10/07/2012  . Personal history of fall 10/07/2012  . Other manifestations of vitamin A deficiency 08/20/2012  . Anemia, unspecified 10/.01/2012  . Essential and other specified forms of tremor 08/20/2012  . Other emphysema 08/20/2012  . Muscle weakness (generalized) 10/.01/2012  . Kyphosis (acquired) (postural) 08/20/2012  . Abnormality of gait 08/20/2012  . Edema 08/20/2012  . Rash and other nonspecific skin eruption 10.01/2012  . Nonspecific abnormal results of pulmonary system function study 08/20/2012  . Dysphagia, unspecified(787.20) 08/20/2012  . Nonspecific abnormal results of pulmonary system function study 08/20/2012  . Candidiasis of skin and nails 08/17/2012  . Memory loss 07/13/2012  . Hyposmolality and/or hyponatremia 07/08/2012  . Hypopotassemia 07/08/2012  . Acute posthemorrhagic anemia 07/08/2012  . Major depressive disorder, single episode, unspecified 07/08/2012  . Atrial fibrillation 07/08/2012  . Hypotension, unspecified 07/08/2012  . Allergic rhinitis due to pollen 07/08/2012  . Bronchiectasis without acute exacerbation 07/08/2012  . Chronic airway obstruction, not elsewhere classified 07/08/2012  . Reflux esophagitis 07/08/2012  . Spinal stenosis, unspecified region other than cervical 07/08/2012  . Edema 07/08/2012  . Chest pain, unspecified  07/08/2012  . Transient ischemic attack (TIA), and cerebral infarction without residual deficits(V12.54) 07/08/2012   Past Surgical History  Procedure Laterality Date  . Cardiac catheterization  06/13/2000    normal. ef 60%  . Cardiovascular stress test  04/2010    ef 54%, and normal perfusion  . Coronary angioplasty  1991    1st diagonal  . Carpal tunnel release      right  . Rotator cuff repair      left shoulder  . Knee surgery  August  2006    S/P right knee replacement  . Laminectomy      2005  . Cataract extraction      Bilateral    Social History:   reports that he quit smoking about 61 years ago. His smoking use included Cigarettes. He has a 25 pack-year smoking history. He has never used smokeless tobacco. He reports that he does not drink alcohol or use illicit drugs.  Family History  Problem Relation Age of Onset  . Heart disease Father   . Heart  failure Father   . Breast cancer Maternal Aunt     Medications: Patient's Medications  New Prescriptions   No medications on file  Previous Medications   ACETAMINOPHEN (TYLENOL) 500 MG TABLET    Take 500 mg by mouth 2 (two) times daily. pain   ALBUTEROL (PROVENTIL) (5 MG/ML) 0.5% NEBULIZER SOLUTION    Take 0.5 mLs (2.5 mg total) by nebulization 4 (four) times daily.   ASPIRIN EC 81 MG TABLET    Take 81 mg by mouth every morning. Take 1 tablet daily to prevent heart attack and stroke.   DILTIAZEM (DILACOR XR) 180 MG 24 HR CAPSULE    Take 180 mg by mouth every morning.   DULOXETINE (CYMBALTA) 20 MG CAPSULE    Take 20 mg by mouth daily.   FAMOTIDINE (PEPCID) 20 MG TABLET    Take 20 mg by mouth 2 (two) times daily. Take 1 tablet once daily.   FEEDING SUPPLEMENT (ENSURE) PUDG    Take 1 Container by mouth 3 (three) times daily between meals.   FLUTICASONE (FLONASE) 50 MCG/ACT NASAL SPRAY    Place 2 sprays into the nose 2 (two) times daily. Two sprays in each nostril, 2 times daily.   FLUTICASONE-SALMETEROL (ADVAIR) 250-50  MCG/DOSE AEPB    Inhale 1 puff into the lungs every 12 (twelve) hours. Take 1 puff twice daily for COPD.   GUAIFENESIN (MUCINEX) 600 MG 12 HR TABLET    Take 1,200 mg by mouth 2 (two) times daily.   LEVOTHYROXINE (SYNTHROID, LEVOTHROID) 50 MCG TABLET    Take 50 mcg by mouth every morning. Take 1 tablet once daily for thyroid.   MELOXICAM (MOBIC) 7.5 MG TABLET    Take 7.5 mg by mouth daily.   MEMANTINE HCL ER (NAMENDA XR) 28 MG CP24    Take 1 capsule by mouth every morning.   MOMETASONE-FORMOTEROL (DULERA) 100-5 MCG/ACT AERO    Inhale 2 puffs into the lungs 2 (two) times daily.   MULTIPLE VITAMINS-MINERALS (PRESERVISION AREDS) CAPS    Take 1 capsule by mouth daily with lunch. Take 1 tablet once daily in the evening.   NITROGLYCERIN (NITROSTAT) 0.4 MG SL TABLET    Place 0.4 mg under the tongue every 5 (five) minutes as needed. Take 1 tablet sublingual at onset of chest pain, repeat in 5 minutes, May repeat x 3 in 15 minutes.  Modified Medications   No medications on file  Discontinued Medications   No medications on file     Physical Exam:Physical Exam  Constitutional: He is oriented to person, place, and time. He appears well-developed and well-nourished. No distress.  HENT:  Head: Normocephalic and atraumatic.  Right Ear: External ear normal.  Nose: Nose normal.  Mouth/Throat: Oropharynx is clear and moist. No oropharyngeal exudate.  Eyes: Conjunctivae and EOM are normal. Pupils are equal, round, and reactive to light. Right eye exhibits no discharge. Left eye exhibits no discharge. No scleral icterus.  Neck: Normal range of motion. Neck supple. No JVD present. No tracheal deviation present. No thyromegaly present.  Cardiovascular: Normal rate.  An irregular rhythm present. Exam reveals no gallop and no friction rub.   No murmur heard. Pulmonary/Chest: Effort normal. He has decreased breath sounds. He has no wheezes. He has no rales. He exhibits no tenderness.  Abdominal: Soft. Bowel sounds  are normal. He exhibits no distension. There is no tenderness. There is no rebound.  Musculoskeletal: Normal range of motion. He exhibits edema (trace in the left ankle. ) and  tenderness (left knee. ).  Chronic knee pain.   Lymphadenopathy:    He has no cervical adenopathy.  Neurological: He is alert and oriented to person, place, and time. He has normal reflexes. No cranial nerve deficit. He exhibits normal muscle tone. Coordination normal.  Skin: Skin is warm and dry. No rash noted. He is not diaphoretic. No erythema. No pallor.  Psychiatric: Judgment and thought content normal. His speech is not delayed. He is slowed and withdrawn. Cognition and memory are impaired. He exhibits a depressed mood. He exhibits abnormal recent memory and abnormal remote memory.    Danley Danker Vitals:   01/10/14 1452  BP: 111/51  Pulse: 63  Temp: 96.5 F (35.8 C)  TempSrc: Tympanic  Resp: 12      Labs reviewed: Basic Metabolic Panel:  Recent Labs  04/09/13 1652  04/11/13 0449 04/13/13 0422 04/14/13 0600 04/19/13 04/27/13  09/30/13 11/23/13 12/14/13  NA  --   < > 132* 125* 125* 123*  --   < > 135* 134* 136*  K  --   < > 3.8 4.2 4.3 4.4  --   < > 4.3 4.2 3.9  CL  --   < > 101 94* 94*  --   --   --   --   --   --   CO2  --   < > 23 23 23   --   --   --   --   --   --   GLUCOSE  --   < > 94 98 92  --   --   --   --   --   --   BUN  --   < > 12 11 10 16   --   < > 21 25* 18  CREATININE  --   < > 1.13 1.08 1.00 1.0  --   < > 1.0 1.2 1.0  CALCIUM  --   < > 8.4 8.4 8.4  --   --   --   --   --   --   MG 2.0  --   --   --   --   --   --   --   --   --   --   PHOS 3.0  --   --   --   --   --   --   --   --   --   --   TSH  --   < >  --   --   --   --  3.72  --  2.38  --   --   < > = values in this interval not displayed. Liver Function Tests:  Recent Labs  04/02/13 2235 04/10/13 0440 04/13/13 0422 09/30/13  AST 14 12 13  12*  ALT 8 6 6  8*  ALKPHOS 57 49 53 46  BILITOT 0.4 0.3 0.2*  --   PROT 7.5  6.2 6.6  --   ALBUMIN 3.1* 2.2* 2.3*  --    CBC:  Recent Labs  04/02/13 2235 04/09/13 1652  04/11/13 0449 04/13/13 0422 04/14/13 0600  09/30/13 11/23/13 12/14/13  WBC 9.5 7.4  < > 5.9 5.8 6.3  < > 4.0 7.0 6.6  NEUTROABS 6.3 5.1  --   --   --   --   --   --   --   --   HGB 10.0* 9.8*  < > 8.4* 8.4* 8.6*  < >  8.7* 9.3* 8.7*  HCT 29.7* 28.0*  < > 25.0* 23.9* 24.0*  < > 26* 27* 26*  MCV 98.0 97.6  < > 100.4* 98.0 95.6  --   --   --   --   PLT 202 264  < > 269 328 381  < > 236 277 281  < > = values in this interval not displayed.  Past Procedures:  08/03/13 CXR no cardiomegaly or pulmonary vascular congestion, old granulomatous disease, patchy pneumonitis/inflammatory consolidate at the right upper lung and left mid/lower lung improved in the interval  Assessment/Plan HTN (hypertension) Controlled, takes Cardizem 180mg  for A-fib   A-fib Controlled, takes Cardizem 180mg  for A-fib   Left knee pain 06/22/13 X-ray left knee: moderate to advanced OA most prominent at the medial compartment and patellofemoral joint space, small degenerative spur at the superior patella., no acute fracture, subluxation, dislocation, or lytic destructive lesion, small knee effusion. Lidoderm patch eased off his pain-dcd it since its not adequate in managing his pain, dcd Meloxicam 2nd to questionable pain relief of the left knee pain, takes Tylenol 650mg  bid also. W/c for mobility. c/o knee pain today-desires to resume Mobic 7.5mg  daily-observe the patient.     Anemia Hgb 8s-8.8 08/03/13, off Iron 07/12/13 by Dr. Reynaldo Minium, Hgb 8.7 09/30/13. Iron 58, B12 348, Folate 9.8, Erythropoietin 20.1-chronic diease in nature. Improved. Last Hgb 9.3 11/23/13, update CBC in 2weeks-showed Hgb 8.7 12/14/13 at his baseline.                     Unspecified hypothyroidism Takes Levothyroxine 31mcg daily,TSH 3.715 04/27/13, 2.379 09/30/13   Depression due to dementia Flat affect and lack of motives-failed  Cymbalta in setting of chronic joint/muscle pains-dicontinued Mirtazapine 7.5mg  qhs 12/02/13 since the patient's wife POA c/o sleepiness and dis functioning after initial 2x. Cymbalta 20mg  daily was resumed 12/02/13 per POA's request.    BRONCHIECTASIS W/O ACUTE EXACERBATION Chronic cough and phlegm production. Takes Advair, Mucinex 1200mg  bid, and prn DuoNeb   Dementia Presently taking Namenda and able to converse w/o behavioral problems.     GERD C/o nausea and sick stomach in am prior to breakfast-better with  Famotidine to 20mg  bid      Abscess of left arm Resolved.     Family/ Staff Communication: observe the patient  Goals of Care: SNF  Labs/tests ordered:  none

## 2014-01-26 ENCOUNTER — Encounter: Payer: Self-pay | Admitting: Nurse Practitioner

## 2014-01-26 ENCOUNTER — Non-Acute Institutional Stay (SKILLED_NURSING_FACILITY): Payer: Medicare Other | Admitting: Nurse Practitioner

## 2014-01-26 DIAGNOSIS — F0280 Dementia in other diseases classified elsewhere without behavioral disturbance: Secondary | ICD-10-CM

## 2014-01-26 DIAGNOSIS — F3289 Other specified depressive episodes: Secondary | ICD-10-CM

## 2014-01-26 DIAGNOSIS — D649 Anemia, unspecified: Secondary | ICD-10-CM

## 2014-01-26 DIAGNOSIS — J449 Chronic obstructive pulmonary disease, unspecified: Secondary | ICD-10-CM

## 2014-01-26 DIAGNOSIS — R627 Adult failure to thrive: Secondary | ICD-10-CM

## 2014-01-26 DIAGNOSIS — W19XXXA Unspecified fall, initial encounter: Secondary | ICD-10-CM

## 2014-01-26 DIAGNOSIS — F329 Major depressive disorder, single episode, unspecified: Secondary | ICD-10-CM

## 2014-01-26 DIAGNOSIS — K219 Gastro-esophageal reflux disease without esophagitis: Secondary | ICD-10-CM

## 2014-01-26 DIAGNOSIS — F0390 Unspecified dementia without behavioral disturbance: Secondary | ICD-10-CM

## 2014-01-26 DIAGNOSIS — E039 Hypothyroidism, unspecified: Secondary | ICD-10-CM

## 2014-01-26 DIAGNOSIS — M25562 Pain in left knee: Secondary | ICD-10-CM

## 2014-01-26 DIAGNOSIS — I4891 Unspecified atrial fibrillation: Secondary | ICD-10-CM

## 2014-01-26 DIAGNOSIS — F0393 Unspecified dementia, unspecified severity, with mood disturbance: Secondary | ICD-10-CM

## 2014-01-26 DIAGNOSIS — M25569 Pain in unspecified knee: Secondary | ICD-10-CM

## 2014-01-26 NOTE — Assessment & Plan Note (Signed)
C/o nausea and sick stomach in am prior to breakfast-better with  Famotidine to 20mg bid   

## 2014-01-26 NOTE — Assessment & Plan Note (Signed)
Progression of dementia, depression, weight loss #6Ibs in one month, ADL dependency-requires assistance with feeding at times. Will check CBC, CMP, TSH, Cath UA C/S to rule out acute process. Also Mobic started 01/10/14 may have negative GI side effect-still c/o left knee pain-? Efficacy in left knee pain control-will switch to Voltaren Gel.

## 2014-01-26 NOTE — Assessment & Plan Note (Signed)
lat affect and lack of motives-failed Cymbalta in setting of chronic joint/muscle pains-dicontinued Mirtazapine 7.5mg  qhs 12/02/13 since the patient's wife POA c/o sleepiness and dis functioning after initial 2x. Cymbalta 20mg  daily was resumed 12/02/13 per POA's request.

## 2014-01-26 NOTE — Assessment & Plan Note (Signed)
09/30/13. Iron 58, B12 348, Folate 9.8, Erythropoietin 20.1-chronic diease in nature. Baseline Hgb 8s-update CBC. Off Iron.

## 2014-01-26 NOTE — Assessment & Plan Note (Signed)
Takes Levothyroxine 74mcg daily,TSH 3.715 04/27/13, 2.379 09/30/13. Update TSH

## 2014-01-26 NOTE — Assessment & Plan Note (Signed)
06/22/13 X-ray left knee: moderate to advanced OA most prominent at the medial compartment and patellofemoral joint space, small degenerative spur at the superior patella., no acute fracture, subluxation, dislocation, or lytic destructive lesion, small knee effusion. takes Tylenol 650mg  bid also. W/c for mobility. Will switch from Mobic to Voltaren Gel due to possible GI side effect since weight lost #6 Ibs in one month.

## 2014-01-26 NOTE — Assessment & Plan Note (Signed)
Progression of dementia, depression, ADL dependency, weight loss #6 Ibs in one month. Will check CBC, CMP, TSH, Cath UA C/S to rule out acute process. Encourage oral intake and assists with feeding as needed.

## 2014-01-26 NOTE — Progress Notes (Signed)
Patient ID: [name], male   DOB: 12-15-1923, 80 y.o.   MRN: [id number]   Code Status: DNR  Allergies  Allergen Reactions  . Sulfonamide Derivatives Other (See Comments)    unknown  . Penicillins Rash    Chief Complaint  Patient presents with  . Medical Managment of Chronic Issues    fatigue, confusion, decreased appetite, weight loss #6Ibs in on month  . Acute Visit    HPI: Patient is a 80 y.o. male seen in the SNF at [facility] today for evaluation of weight loss/ADL dependency, left knee pain  and other chronic medical conditions.  Problem List Items Addressed This Visit   Unspecified hypothyroidism     Takes Levothyroxine 24mcg daily,TSH 3.715 04/27/13, 2.379 09/30/13. Update TSH      Left knee pain     06/22/13 X-ray left knee: moderate to advanced OA most prominent at the medial compartment and patellofemoral joint space, small degenerative spur at the superior patella., no acute fracture, subluxation, dislocation, or lytic destructive lesion, small knee effusion. takes Tylenol 650mg  bid also. W/c for mobility. Will switch from Mobic to Voltaren Gel due to possible GI side effect since weight lost #6 Ibs in one month.       GERD     C/o nausea and sick stomach in am prior to breakfast-better with  Famotidine to 20mg  bid       FTT (failure to thrive) in adult - Primary     Progression of dementia, depression, weight loss #6Ibs in one month, ADL dependency-requires assistance with feeding at times. Will check CBC, CMP, TSH, Cath UA C/S to rule out acute process. Also Mobic started 01/10/14 may have negative GI side effect-still c/o left knee pain-? Efficacy in left knee pain control-will switch to Voltaren Gel.     Fall     Progression of dementia, depression, ADL dependency, weight loss #6 Ibs in one month. Will check CBC, CMP, TSH, Cath UA C/S to rule out acute process. Encourage oral intake and assists with feeding as needed.     Depression due to dementia      lat affect and lack of motives-failed Cymbalta in setting of chronic joint/muscle pains-dicontinued Mirtazapine 7.5mg  qhs 12/02/13 since the patient's wife POA c/o sleepiness and dis functioning after initial 2x. Cymbalta 20mg  daily was resumed 12/02/13 per POA's request.       Dementia     Presently taking Namenda and slower mentation noted.     COPD     Chronic cough and phlegm production. Takes Advair, Mucinex 1200mg  bid, and prn DuoNeb       Anemia     09/30/13. Iron 58, B12 348, Folate 9.8, Erythropoietin 20.1-chronic diease in nature. Baseline Hgb 8s-update CBC. Off Iron.        A-fib     Controlled, takes Cardizem 180mg  for A-fib         Review of Systems:  Review of Systems  Constitutional: Positive for weight loss. Negative for fever, chills and diaphoresis.       #6 Ibs in one month.   HENT: Positive for hearing loss. Negative for congestion, ear discharge, ear pain and sore throat.   Eyes: Negative for blurred vision, double vision, photophobia, pain, discharge and redness.  Respiratory: Positive for cough. Negative for hemoptysis, sputum production, shortness of breath and wheezing.   Cardiovascular: Positive for leg swelling and PND. Negative for chest pain, palpitations, orthopnea and claudication.  Very trace edema in ankles.   Gastrointestinal: Positive for nausea. Negative for heartburn, vomiting, abdominal pain, diarrhea, constipation and blood in stool.       In am ac breakfast sometimes.   Genitourinary: Positive for frequency. Negative for dysuria, urgency, hematuria and flank pain.  Musculoskeletal: Positive for joint pain (left knee pain ). Negative for back pain, falls, myalgias and neck pain.       Left knee pain.   Skin: Negative for itching and rash.  Neurological: Positive for weakness. Negative for dizziness, tingling, tremors, sensory change, speech change, focal weakness, seizures, loss of consciousness and headaches.    Endo/Heme/Allergies: Negative for environmental allergies and polydipsia. Does not bruise/bleed easily.  Psychiatric/Behavioral: Positive for depression and memory loss. Negative for hallucinations and substance abuse. The patient is not nervous/anxious and does not have insomnia.        Flat affect and lack of motives.      Past Medical History  Diagnosis Date  . Allergic rhinitis   . 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 COPD (chronic obstructive pulmonary disease)   . Diverticula of colon   . Right leg DVT 01/2010  . IHD (ischemic heart disease)   . Hyperlipidemia   . Hypertension   . Depression   . Myalgia   . Hypothyroidism   . Bronchiectasis   . Spinal stenosis   . History of heartburn   . Joint pain   . 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 Coronary artery disease   . Pneumonia   . Arthritis   . CHF (congestive heart failure)   . Impacted cerumen 12/21/2012  . Arthralgia of temporomandibular joint 12/21/2012  . Contusion of hand(s) 10/07/2012  . Personal history of fall 10/07/2012  . Other manifestations of vitamin A deficiency 08/20/2012  . Anemia, unspecified 10/.01/2012  . Essential and other specified forms of tremor 08/20/2012  . Other emphysema 08/20/2012  . Muscle weakness (generalized) 10/.01/2012  . Kyphosis (acquired) (postural) 08/20/2012  . Abnormality of gait 08/20/2012  . Edema 08/20/2012  . Rash and other nonspecific skin eruption 10.01/2012  . Nonspecific abnormal results of pulmonary system function study 08/20/2012  . Dysphagia, unspecified(787.20) 08/20/2012  . Nonspecific abnormal results of pulmonary system function study 08/20/2012  . Candidiasis of skin and nails 08/17/2012  . Memory loss 07/13/2012  . Hyposmolality and/or hyponatremia 07/08/2012  . Hypopotassemia 07/08/2012  . Acute posthemorrhagic anemia 07/08/2012  . Major depressive disorder, single episode, unspecified 07/08/2012  . Atrial  fibrillation 07/08/2012  . Hypotension, unspecified 07/08/2012  . Allergic rhinitis due to pollen 07/08/2012  . Bronchiectasis without acute exacerbation 07/08/2012  . Chronic airway obstruction, not elsewhere classified 07/08/2012  . Reflux esophagitis 07/08/2012  . Spinal stenosis, unspecified region other than cervical 07/08/2012  . Edema 07/08/2012  . Chest pain, unspecified 07/08/2012  . Transient ischemic attack (TIA), and cerebral infarction without residual deficits(V12.54) 07/08/2012   Past Surgical History  Procedure Laterality Date  . Cardiac catheterization  06/13/2000    normal. ef 60%  . Cardiovascular stress test  04/2010    ef 54%, and normal perfusion  . Coronary angioplasty  1991    1st diagonal  . Carpal tunnel release      right  . Rotator cuff repair      left shoulder  . Knee surgery  August  2006    S/P right knee replacement  . Laminectomy  2005  . Cataract extraction      Bilateral    Social History:   reports that he quit smoking about 61 years ago. His smoking use included Cigarettes. He has a 25 pack-year smoking history. He has never used smokeless tobacco. He reports that he does not drink alcohol or use illicit drugs.  Family History  Problem Relation Age of Onset  . Heart disease Father   . Heart failure Father   . Breast cancer Maternal Aunt     Medications: Patient's Medications  New Prescriptions   No medications on file  Previous Medications   ACETAMINOPHEN (TYLENOL) 500 MG TABLET    Take 500 mg by mouth 2 (two) times daily. pain   ALBUTEROL (PROVENTIL) (5 MG/ML) 0.5% NEBULIZER SOLUTION    Take 0.5 mLs (2.5 mg total) by nebulization 4 (four) times daily.   ASPIRIN EC 81 MG TABLET    Take 81 mg by mouth every morning. Take 1 tablet daily to prevent heart attack and stroke.   DILTIAZEM (DILACOR XR) 180 MG 24 HR CAPSULE    Take 180 mg by mouth every morning.   DULOXETINE (CYMBALTA) 20 MG CAPSULE    Take 20 mg by mouth daily.    FAMOTIDINE (PEPCID) 20 MG TABLET    Take 20 mg by mouth 2 (two) times daily. Take 1 tablet once daily.   FEEDING SUPPLEMENT (ENSURE) PUDG    Take 1 Container by mouth 3 (three) times daily between meals.   FLUTICASONE (FLONASE) 50 MCG/ACT NASAL SPRAY    Place 2 sprays into the nose 2 (two) times daily. Two sprays in each nostril, 2 times daily.   FLUTICASONE-SALMETEROL (ADVAIR) 250-50 MCG/DOSE AEPB    Inhale 1 puff into the lungs every 12 (twelve) hours. Take 1 puff twice daily for COPD.   GUAIFENESIN (MUCINEX) 600 MG 12 HR TABLET    Take 1,200 mg by mouth 2 (two) times daily.   LEVOTHYROXINE (SYNTHROID, LEVOTHROID) 50 MCG TABLET    Take 50 mcg by mouth every morning. Take 1 tablet once daily for thyroid.   MELOXICAM (MOBIC) 7.5 MG TABLET    Take 7.5 mg by mouth daily.   MEMANTINE HCL ER (NAMENDA XR) 28 MG CP24    Take 1 capsule by mouth every morning.   MOMETASONE-FORMOTEROL (DULERA) 100-5 MCG/ACT AERO    Inhale 2 puffs into the lungs 2 (two) times daily.   MULTIPLE VITAMINS-MINERALS (PRESERVISION AREDS) CAPS    Take 1 capsule by mouth daily with lunch. Take 1 tablet once daily in the evening.   NITROGLYCERIN (NITROSTAT) 0.4 MG SL TABLET    Place 0.4 mg under the tongue every 5 (five) minutes as needed. Take 1 tablet sublingual at onset of chest pain, repeat in 5 minutes, May repeat x 3 in 15 minutes.  Modified Medications   No medications on file  Discontinued Medications   No medications on file     Physical Exam:Physical Exam  Constitutional: He is oriented to person, place, and time. He appears well-developed and well-nourished. No distress.  HENT:  Head: Normocephalic and atraumatic.  Right Ear: External ear normal.  Nose: Nose normal.  Mouth/Throat: Oropharynx is clear and moist. No oropharyngeal exudate.  Eyes: Conjunctivae and EOM are normal. Pupils are equal, round, and reactive to light. Right eye exhibits no discharge. Left eye exhibits no discharge. No scleral icterus.  Neck:  Normal range of motion. Neck supple. No JVD present. No tracheal deviation present. No thyromegaly present.  Cardiovascular:  Normal rate.  An irregular rhythm present. Exam reveals no gallop and no friction rub.   No murmur heard. Pulmonary/Chest: Effort normal. He has decreased breath sounds. He has no wheezes. He has no rales. He exhibits no tenderness.  Abdominal: Soft. Bowel sounds are normal. He exhibits no distension. There is no tenderness. There is no rebound.  Musculoskeletal: Normal range of motion. He exhibits edema (trace in the left ankle. ) and tenderness (left knee. ).  Chronic left knee pain. Trace edema in ankles.   Lymphadenopathy:    He has no cervical adenopathy.  Neurological: He is alert and oriented to person, place, and time. He has normal reflexes. No cranial nerve deficit. He exhibits normal muscle tone. Coordination normal.  Skin: Skin is warm and dry. No rash noted. He is not diaphoretic. No erythema. No pallor.  Psychiatric: Judgment and thought content normal. His speech is not delayed. He is slowed and withdrawn. Cognition and memory are impaired. He exhibits a depressed mood. He exhibits abnormal recent memory and abnormal remote memory.    Danley Danker Vitals:   01/26/14 1624  BP: 107/60  Pulse: 78  Temp: 97.5 F (36.4 C)  TempSrc: Tympanic  Resp: 14      Labs reviewed: Basic Metabolic Panel:  Recent Labs  04/09/13 1652  04/11/13 0449 04/13/13 0422 04/14/13 0600 04/19/13 04/27/13  09/30/13 11/23/13 12/14/13  NA  --   < > 132* 125* 125* 123*  --   < > 135* 134* 136*  K  --   < > 3.8 4.2 4.3 4.4  --   < > 4.3 4.2 3.9  CL  --   < > 101 94* 94*  --   --   --   --   --   --   CO2  --   < > 23 23 23   --   --   --   --   --   --   GLUCOSE  --   < > 94 98 92  --   --   --   --   --   --   BUN  --   < > 12 11 10 16   --   < > 21 25* 18  CREATININE  --   < > 1.13 1.08 1.00 1.0  --   < > 1.0 1.2 1.0  CALCIUM  --   < > 8.4 8.4 8.4  --   --   --   --   --   --    MG 2.0  --   --   --   --   --   --   --   --   --   --   PHOS 3.0  --   --   --   --   --   --   --   --   --   --   TSH  --   < >  --   --   --   --  3.72  --  2.38  --   --   < > = values in this interval not displayed. Liver Function Tests:  Recent Labs  04/02/13 2235 04/10/13 0440 04/13/13 0422 09/30/13  AST 14 12 13  12*  ALT 8 6 6  8*  ALKPHOS 57 49 53 46  BILITOT 0.4 0.3 0.2*  --   PROT 7.5 6.2 6.6  --   ALBUMIN 3.1* 2.2* 2.3*  --  CBC:  Recent Labs  04/02/13 2235 04/09/13 1652  04/11/13 0449 04/13/13 0422 04/14/13 0600  09/30/13 11/23/13 12/14/13  WBC 9.5 7.4  < > 5.9 5.8 6.3  < > 4.0 7.0 6.6  NEUTROABS 6.3 5.1  --   --   --   --   --   --   --   --   HGB 10.0* 9.8*  < > 8.4* 8.4* 8.6*  < > 8.7* 9.3* 8.7*  HCT 29.7* 28.0*  < > 25.0* 23.9* 24.0*  < > 26* 27* 26*  MCV 98.0 97.6  < > 100.4* 98.0 95.6  --   --   --   --   PLT 202 264  < > 269 328 381  < > 236 277 281  < > = values in this interval not displayed.  Past Procedures:  08/03/13 CXR no cardiomegaly or pulmonary vascular congestion, old granulomatous disease, patchy pneumonitis/inflammatory consolidate at the right upper lung and left mid/lower lung improved in the interval  Assessment/Plan Fall Progression of dementia, depression, ADL dependency, weight loss #6 Ibs in one month. Will check CBC, CMP, TSH, Cath UA C/S to rule out acute process. Encourage oral intake and assists with feeding as needed.   COPD Chronic cough and phlegm production. Takes Advair, Mucinex 1200mg  bid, and prn DuoNeb     Unspecified hypothyroidism Takes Levothyroxine 31mcg daily,TSH 3.715 04/27/13, 2.379 09/30/13. Update TSH    Depression due to dementia lat affect and lack of motives-failed Cymbalta in setting of chronic joint/muscle pains-dicontinued Mirtazapine 7.5mg  qhs 12/02/13 since the patient's wife POA c/o sleepiness and dis functioning after initial 2x. Cymbalta 20mg  daily was resumed 12/02/13 per POA's request.       A-fib Controlled, takes Cardizem 180mg  for A-fib    FTT (failure to thrive) in adult Progression of dementia, depression, weight loss #6Ibs in one month, ADL dependency-requires assistance with feeding at times. Will check CBC, CMP, TSH, Cath UA C/S to rule out acute process. Also Mobic started 01/10/14 may have negative GI side effect-still c/o left knee pain-? Efficacy in left knee pain control-will switch to Voltaren Gel.   Dementia Presently taking Namenda and slower mentation noted.   GERD C/o nausea and sick stomach in am prior to breakfast-better with  Famotidine to 20mg  bid     Left knee pain 06/22/13 X-ray left knee: moderate to advanced OA most prominent at the medial compartment and patellofemoral joint space, small degenerative spur at the superior patella., no acute fracture, subluxation, dislocation, or lytic destructive lesion, small knee effusion. takes Tylenol 650mg  bid also. W/c for mobility. Will switch from Mobic to Voltaren Gel due to possible GI side effect since weight lost #6 Ibs in one month.     Anemia 09/30/13. Iron 58, B12 348, Folate 9.8, Erythropoietin 20.1-chronic diease in nature. Baseline Hgb 8s-update CBC. Off Iron.        Family/ Staff Communication: observe the patient  Goals of Care: SNF  Labs/tests ordered:  CBC, CMP, TSH, Cath UA C/S

## 2014-01-26 NOTE — Assessment & Plan Note (Signed)
Chronic cough and phlegm production. Takes Advair, Mucinex 1200mg  bid, and prn DuoNeb

## 2014-01-26 NOTE — Assessment & Plan Note (Signed)
Controlled, takes Cardizem 180mg for A-fib  

## 2014-01-26 NOTE — Assessment & Plan Note (Signed)
Presently taking Namenda and slower mentation noted.   

## 2014-01-27 LAB — HEPATIC FUNCTION PANEL
ALT: 8 U/L — AB (ref 10–40)
AST: 13 U/L — AB (ref 14–40)
Alkaline Phosphatase: 554 U/L — AB (ref 25–125)
Bilirubin, Total: 0.9 mg/dL

## 2014-01-27 LAB — BASIC METABOLIC PANEL
BUN: 14 mg/dL (ref 4–21)
Creatinine: 0.9 mg/dL (ref 0.6–1.3)
Glucose: 83 mg/dL
Potassium: 3.9 mmol/L (ref 3.4–5.3)
SODIUM: 132 mmol/L — AB (ref 137–147)

## 2014-01-27 LAB — CBC AND DIFFERENTIAL
HCT: 22 % — AB (ref 41–53)
Hemoglobin: 9.5 g/dL — AB (ref 13.5–17.5)
Platelets: 310 10*3/uL (ref 150–399)
WBC: 4.6 10^3/mL

## 2014-01-27 LAB — TSH: TSH: 3.08 u[IU]/mL (ref 0.41–5.90)

## 2014-01-31 ENCOUNTER — Non-Acute Institutional Stay (SKILLED_NURSING_FACILITY): Payer: Medicare Other | Admitting: Nurse Practitioner

## 2014-01-31 ENCOUNTER — Encounter: Payer: Self-pay | Admitting: Nurse Practitioner

## 2014-01-31 DIAGNOSIS — F3289 Other specified depressive episodes: Secondary | ICD-10-CM

## 2014-01-31 DIAGNOSIS — E871 Hypo-osmolality and hyponatremia: Secondary | ICD-10-CM

## 2014-01-31 DIAGNOSIS — D649 Anemia, unspecified: Secondary | ICD-10-CM

## 2014-01-31 DIAGNOSIS — R609 Edema, unspecified: Secondary | ICD-10-CM

## 2014-01-31 DIAGNOSIS — K219 Gastro-esophageal reflux disease without esophagitis: Secondary | ICD-10-CM

## 2014-01-31 DIAGNOSIS — F0393 Unspecified dementia, unspecified severity, with mood disturbance: Secondary | ICD-10-CM

## 2014-01-31 DIAGNOSIS — F329 Major depressive disorder, single episode, unspecified: Secondary | ICD-10-CM

## 2014-01-31 DIAGNOSIS — E039 Hypothyroidism, unspecified: Secondary | ICD-10-CM

## 2014-01-31 DIAGNOSIS — F0280 Dementia in other diseases classified elsewhere without behavioral disturbance: Secondary | ICD-10-CM

## 2014-01-31 DIAGNOSIS — I10 Essential (primary) hypertension: Secondary | ICD-10-CM

## 2014-01-31 DIAGNOSIS — F0390 Unspecified dementia without behavioral disturbance: Secondary | ICD-10-CM

## 2014-01-31 DIAGNOSIS — N390 Urinary tract infection, site not specified: Secondary | ICD-10-CM

## 2014-01-31 DIAGNOSIS — I4891 Unspecified atrial fibrillation: Secondary | ICD-10-CM

## 2014-01-31 NOTE — Assessment & Plan Note (Signed)
Controlled, takes Cardizem 180mg for A-fib  

## 2014-01-31 NOTE — Assessment & Plan Note (Signed)
09/30/13. Iron 58, B12 348, Folate 9.8, Erythropoietin 20.1-chronic diease in nature. Baseline Hgb 8s-9.5 01/27/14. Off Iron.

## 2014-01-31 NOTE — Assessment & Plan Note (Signed)
Takes Levothyroxine 19mcg daily,TSH 3.715 04/27/13, 2.379 09/30/13, 3.078 01/27/14

## 2014-01-31 NOTE — Assessment & Plan Note (Signed)
Trace in his left ankle,  stopped taking Lasix upon discharge, monitor wt and resume as indicated. Weights are stable

## 2014-01-31 NOTE — Progress Notes (Signed)
Patient ID: [name], male   DOB: July 05, 1924, 80 y.o.   MRN: [id number]   Code Status: DNR  Allergies  Allergen Reactions  . Sulfonamide Derivatives Other (See Comments)    unknown  . Penicillins Rash    Chief Complaint  Patient presents with  . Medical Managment of Chronic Issues  . Acute Visit    hyponatremia, UTI, anemia  . Dementia    HPI: Patient is a 80 y.o. male seen in the SNF at [facility] today for evaluation of hyponatremia, UTI,  and other chronic medical conditions.  Problem List Items Addressed This Visit   Urinary tract infection, site not specified     Urine culture 01/29/14 K. Pneumoniae 100,000c/ml, susceptible to Imipenem-Primaxin 500mg  IV q6hr for 7 days started. Tolerated well.     Unspecified hypothyroidism - Primary     Takes Levothyroxine 77mcg daily,TSH 3.715 04/27/13, 2.379 09/30/13, 3.078 01/27/14     Hyponatremia (Chronic)     Na 134 11/23/13. Dc Demeclocycline to 150mg  daily. Na 132 01/27/14. Update BMP in one week.        HTN (hypertension)     Controlled, takes Cardizem 180mg  for A-fib    GERD     C/o nausea and sick stomach in am prior to breakfast-better with  Famotidine to 20mg  bid      Edema     Trace in his left ankle,  stopped taking Lasix upon discharge, monitor wt and resume as indicated. Weights are stable     Depression due to dementia     flat affect and lack of motives-failed Cymbalta in setting of chronic joint/muscle pains-dicontinued Mirtazapine 7.5mg  qhs 12/02/13 since the patient's wife POA c/o sleepiness and dis functioning after initial 2x. Cymbalta 20mg  daily was resumed 12/02/13 per POA's request.      Dementia     Presently taking Namenda and slower mentation noted.      Anemia     09/30/13. Iron 58, B12 348, Folate 9.8, Erythropoietin 20.1-chronic diease in nature. Baseline Hgb 8s-9.5 01/27/14. Off Iron.     A-fib     Controlled, takes Cardizem 180mg  for A-fib         Review of Systems:    Review of Systems  Constitutional: Positive for weight loss. Negative for fever, chills and diaphoresis.       #6 Ibs in one month.  HENT: Positive for hearing loss. Negative for congestion, ear discharge, ear pain and sore throat.   Eyes: Negative for blurred vision, double vision, photophobia, pain, discharge and redness.  Respiratory: Positive for cough. Negative for hemoptysis, sputum production, shortness of breath and wheezing.   Cardiovascular: Positive for leg swelling and PND. Negative for chest pain, palpitations, orthopnea and claudication.       Very trace edema in ankles.   Gastrointestinal: Positive for nausea. Negative for heartburn, vomiting, abdominal pain, diarrhea, constipation and blood in stool.       In am ac breakfast sometimes.   Genitourinary: Positive for frequency. Negative for dysuria, urgency, hematuria and flank pain.  Musculoskeletal: Positive for joint pain (left knee pain ). Negative for back pain, falls, myalgias and neck pain.       Left knee pain.   Skin: Negative for itching and rash.  Neurological: Positive for weakness. Negative for dizziness, tingling, tremors, sensory change, speech change, focal weakness, seizures, loss of consciousness and headaches.  Endo/Heme/Allergies: Negative for environmental allergies and polydipsia. Does not bruise/bleed easily.  Psychiatric/Behavioral: Positive for depression  and memory loss. Negative for hallucinations and substance abuse. The patient is not nervous/anxious and does not have insomnia.        Flat affect and lack of motives.      Past Medical History  Diagnosis Date  . Allergic rhinitis   . 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 COPD (chronic obstructive pulmonary disease)   . Diverticula of colon   . Right leg DVT 01/2010  . IHD (ischemic heart disease)   . Hyperlipidemia   . Hypertension   . Depression   . Myalgia   . Hypothyroidism   . Bronchiectasis   . Spinal stenosis   .  History of heartburn   . Joint pain   . 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 Coronary artery disease   . Pneumonia   . Arthritis   . CHF (congestive heart failure)   . Impacted cerumen 12/21/2012  . Arthralgia of temporomandibular joint 12/21/2012  . Contusion of hand(s) 10/07/2012  . Personal history of fall 10/07/2012  . Other manifestations of vitamin A deficiency 08/20/2012  . Anemia, unspecified 10/.01/2012  . Essential and other specified forms of tremor 08/20/2012  . Other emphysema 08/20/2012  . Muscle weakness (generalized) 10/.01/2012  . Kyphosis (acquired) (postural) 08/20/2012  . Abnormality of gait 08/20/2012  . Edema 08/20/2012  . Rash and other nonspecific skin eruption 10.01/2012  . Nonspecific abnormal results of pulmonary system function study 08/20/2012  . Dysphagia, unspecified(787.20) 08/20/2012  . Nonspecific abnormal results of pulmonary system function study 08/20/2012  . Candidiasis of skin and nails 08/17/2012  . Memory loss 07/13/2012  . Hyposmolality and/or hyponatremia 07/08/2012  . Hypopotassemia 07/08/2012  . Acute posthemorrhagic anemia 07/08/2012  . Major depressive disorder, single episode, unspecified 07/08/2012  . Atrial fibrillation 07/08/2012  . Hypotension, unspecified 07/08/2012  . Allergic rhinitis due to pollen 07/08/2012  . Bronchiectasis without acute exacerbation 07/08/2012  . Chronic airway obstruction, not elsewhere classified 07/08/2012  . Reflux esophagitis 07/08/2012  . Spinal stenosis, unspecified region other than cervical 07/08/2012  . Edema 07/08/2012  . Chest pain, unspecified 07/08/2012  . Transient ischemic attack (TIA), and cerebral infarction without residual deficits(V12.54) 07/08/2012   Past Surgical History  Procedure Laterality Date  . Cardiac catheterization  06/13/2000    normal. ef 60%  . Cardiovascular stress test  04/2010    ef 54%, and normal perfusion  . Coronary angioplasty  1991     1st diagonal  . Carpal tunnel release      right  . Rotator cuff repair      left shoulder  . Knee surgery  August  2006    S/P right knee replacement  . Laminectomy      2005  . Cataract extraction      Bilateral    Social History:   reports that he quit smoking about 61 years ago. His smoking use included Cigarettes. He has a 25 pack-year smoking history. He has never used smokeless tobacco. He reports that he does not drink alcohol or use illicit drugs.  Family History  Problem Relation Age of Onset  . Heart disease Father   . Heart failure Father   . Breast cancer Maternal Aunt     Medications: Patient's Medications  New Prescriptions   No medications on file  Previous Medications   ACETAMINOPHEN (TYLENOL) 500 MG TABLET    Take 500 mg by mouth 2 (two) times daily. pain  ALBUTEROL (PROVENTIL) (5 MG/ML) 0.5% NEBULIZER SOLUTION    Take 0.5 mLs (2.5 mg total) by nebulization 4 (four) times daily.   ASPIRIN EC 81 MG TABLET    Take 81 mg by mouth every morning. Take 1 tablet daily to prevent heart attack and stroke.   DILTIAZEM (DILACOR XR) 180 MG 24 HR CAPSULE    Take 180 mg by mouth every morning.   DULOXETINE (CYMBALTA) 20 MG CAPSULE    Take 20 mg by mouth daily.   FAMOTIDINE (PEPCID) 20 MG TABLET    Take 20 mg by mouth 2 (two) times daily. Take 1 tablet once daily.   FEEDING SUPPLEMENT (ENSURE) PUDG    Take 1 Container by mouth 3 (three) times daily between meals.   FLUTICASONE (FLONASE) 50 MCG/ACT NASAL SPRAY    Place 2 sprays into the nose 2 (two) times daily. Two sprays in each nostril, 2 times daily.   FLUTICASONE-SALMETEROL (ADVAIR) 250-50 MCG/DOSE AEPB    Inhale 1 puff into the lungs every 12 (twelve) hours. Take 1 puff twice daily for COPD.   GUAIFENESIN (MUCINEX) 600 MG 12 HR TABLET    Take 1,200 mg by mouth 2 (two) times daily.   LEVOTHYROXINE (SYNTHROID, LEVOTHROID) 50 MCG TABLET    Take 50 mcg by mouth every morning. Take 1 tablet once daily for thyroid.    MELOXICAM (MOBIC) 7.5 MG TABLET    Take 7.5 mg by mouth daily.   MEMANTINE HCL ER (NAMENDA XR) 28 MG CP24    Take 1 capsule by mouth every morning.   MOMETASONE-FORMOTEROL (DULERA) 100-5 MCG/ACT AERO    Inhale 2 puffs into the lungs 2 (two) times daily.   MULTIPLE VITAMINS-MINERALS (PRESERVISION AREDS) CAPS    Take 1 capsule by mouth daily with lunch. Take 1 tablet once daily in the evening.   NITROGLYCERIN (NITROSTAT) 0.4 MG SL TABLET    Place 0.4 mg under the tongue every 5 (five) minutes as needed. Take 1 tablet sublingual at onset of chest pain, repeat in 5 minutes, May repeat x 3 in 15 minutes.  Modified Medications   No medications on file  Discontinued Medications   No medications on file     Physical Exam: Physical Exam  Constitutional: He is oriented to person, place, and time. He appears well-developed and well-nourished. No distress.  HENT:  Head: Normocephalic and atraumatic.  Right Ear: External ear normal.  Nose: Nose normal.  Mouth/Throat: Oropharynx is clear and moist. No oropharyngeal exudate.  Eyes: Conjunctivae and EOM are normal. Pupils are equal, round, and reactive to light. Right eye exhibits no discharge. Left eye exhibits no discharge. No scleral icterus.  Neck: Normal range of motion. Neck supple. No JVD present. No tracheal deviation present. No thyromegaly present.  Cardiovascular: Normal rate.  An irregular rhythm present. Exam reveals no gallop and no friction rub.   No murmur heard. Pulmonary/Chest: Effort normal. He has decreased breath sounds. He has no wheezes. He has no rales. He exhibits no tenderness.  Abdominal: Soft. Bowel sounds are normal. He exhibits no distension. There is no tenderness. There is no rebound.  Musculoskeletal: Normal range of motion. He exhibits edema (trace in the left ankle. ) and tenderness (left knee. ).  Chronic left knee pain. Trace edema in ankles.   Lymphadenopathy:    He has no cervical adenopathy.  Neurological: He is  alert and oriented to person, place, and time. He has normal reflexes. No cranial nerve deficit. He exhibits normal muscle tone.  Coordination normal.  Skin: Skin is warm and dry. No rash noted. He is not diaphoretic. No erythema. No pallor.  Psychiatric: Judgment and thought content normal. His speech is not delayed. He is slowed and withdrawn. Cognition and memory are impaired. He exhibits a depressed mood. He exhibits abnormal recent memory and abnormal remote memory.    Filed Vitals:   01/31/14 1146  BP: 107/60  Pulse: 78  Temp: 98 F (36.7 C)  TempSrc: Tympanic  Resp: 14      Labs reviewed: Basic Metabolic Panel:  Recent Labs  04/09/13 1652  04/11/13 0449 04/13/13 0422 04/14/13 0600  04/27/13  09/30/13 11/23/13 12/14/13 01/27/14  NA  --   < > 132* 125* 125*  < >  --   < > 135* 134* 136* 132*  K  --   < > 3.8 4.2 4.3  < >  --   < > 4.3 4.2 3.9 3.9  CL  --   < > 101 94* 94*  --   --   --   --   --   --   --   CO2  --   < > 23 23 23   --   --   --   --   --   --   --   GLUCOSE  --   < > 94 98 92  --   --   --   --   --   --   --   BUN  --   < > 12 11 10   < >  --   < > 21 25* 18 14  CREATININE  --   < > 1.13 1.08 1.00  < >  --   < > 1.0 1.2 1.0 0.9  CALCIUM  --   < > 8.4 8.4 8.4  --   --   --   --   --   --   --   MG 2.0  --   --   --   --   --   --   --   --   --   --   --   PHOS 3.0  --   --   --   --   --   --   --   --   --   --   --   TSH  --   < >  --   --   --   --  3.72  --  2.38  --   --  3.08  < > = values in this interval not displayed. Liver Function Tests:  Recent Labs  04/02/13 2235 04/10/13 0440 04/13/13 0422 09/30/13 01/27/14  AST 14 12 13  12* 13*  ALT 8 6 6  8* 8*  ALKPHOS 57 49 53 46 554*  BILITOT 0.4 0.3 0.2*  --   --   PROT 7.5 6.2 6.6  --   --   ALBUMIN 3.1* 2.2* 2.3*  --   --    No results found for this basename: LIPASE, AMYLASE,  in the last 8760 hours No results found for this basename: AMMONIA,  in the last 8760 hours CBC:  Recent Labs   04/02/13 2235 04/09/13 1652  04/11/13 0449 04/13/13 0422 04/14/13 0600  11/23/13 12/14/13 01/27/14  WBC 9.5 7.4  < > 5.9 5.8 6.3  < > 7.0 6.6 4.6  NEUTROABS 6.3 5.1  --   --   --   --   --   --   --   --  HGB 10.0* 9.8*  < > 8.4* 8.4* 8.6*  < > 9.3* 8.7* 9.5*  HCT 29.7* 28.0*  < > 25.0* 23.9* 24.0*  < > 27* 26* 22*  MCV 98.0 97.6  < > 100.4* 98.0 95.6  --   --   --   --   PLT 202 264  < > 269 328 381  < > 277 281 310  < > = values in this interval not displayed.  Past Procedures:  08/03/13 CXR no cardiomegaly or pulmonary vascular congestion, old granulomatous disease, patchy pneumonitis/inflammatory consolidate at the right upper lung and left mid/lower lung improved in the interval       Assessment/Plan Unspecified hypothyroidism Takes Levothyroxine 67mcg daily,TSH 3.715 04/27/13, 2.379 09/30/13, 3.078 01/27/14   Hyponatremia Na 134 11/23/13. Dc Demeclocycline to 150mg  daily. Na 132 01/27/14. Update BMP in one week.      A-fib Controlled, takes Cardizem 180mg  for A-fib    Anemia 09/30/13. Iron 58, B12 348, Folate 9.8, Erythropoietin 20.1-chronic diease in nature. Baseline Hgb 8s-9.5 01/27/14. Off Iron.   Dementia Presently taking Namenda and slower mentation noted.    Depression due to dementia flat affect and lack of motives-failed Cymbalta in setting of chronic joint/muscle pains-dicontinued Mirtazapine 7.5mg  qhs 12/02/13 since the patient's wife POA c/o sleepiness and dis functioning after initial 2x. Cymbalta 20mg  daily was resumed 12/02/13 per POA's request.    Edema Trace in his left ankle,  stopped taking Lasix upon discharge, monitor wt and resume as indicated. Weights are stable   GERD C/o nausea and sick stomach in am prior to breakfast-better with  Famotidine to 20mg  bid    HTN (hypertension) Controlled, takes Cardizem 180mg  for A-fib  Urinary tract infection, site not specified Urine culture 01/29/14 K. Pneumoniae 100,000c/ml, susceptible to  Imipenem-Primaxin 500mg  IV q6hr for 7 days started. Tolerated well.     Family/ Staff Communication: observe the patient  Goals of Care: SNF  Labs/tests ordered: BMP in one week.

## 2014-01-31 NOTE — Assessment & Plan Note (Signed)
flat affect and lack of motives-failed Cymbalta in setting of chronic joint/muscle pains-dicontinued Mirtazapine 7.5mg  qhs 12/02/13 since the patient's wife POA c/o sleepiness and dis functioning after initial 2x. Cymbalta 20mg  daily was resumed 12/02/13 per POA's request.

## 2014-01-31 NOTE — Assessment & Plan Note (Signed)
Presently taking Namenda and slower mentation noted.

## 2014-01-31 NOTE — Assessment & Plan Note (Signed)
C/o nausea and sick stomach in am prior to breakfast-better with  Famotidine to 20mg bid   

## 2014-01-31 NOTE — Assessment & Plan Note (Signed)
Na 134 11/23/13. Dc Demeclocycline to 150mg  daily. Na 132 01/27/14. Update BMP in one week.

## 2014-01-31 NOTE — Assessment & Plan Note (Signed)
Urine culture 01/29/14 K. Pneumoniae 100,000c/ml, susceptible to Imipenem-Primaxin 500mg  IV q6hr for 7 days started. Tolerated well.

## 2014-02-08 LAB — BASIC METABOLIC PANEL
BUN: 13 mg/dL (ref 4–21)
CREATININE: 0.8 mg/dL (ref 0.6–1.3)
Glucose: 88 mg/dL
Potassium: 3.5 mmol/L (ref 3.4–5.3)
Sodium: 130 mmol/L — AB (ref 137–147)

## 2014-02-16 ENCOUNTER — Non-Acute Institutional Stay (SKILLED_NURSING_FACILITY): Payer: Medicare Other | Admitting: Nurse Practitioner

## 2014-02-16 ENCOUNTER — Encounter: Payer: Self-pay | Admitting: Nurse Practitioner

## 2014-02-16 DIAGNOSIS — I10 Essential (primary) hypertension: Secondary | ICD-10-CM

## 2014-02-16 DIAGNOSIS — J4489 Other specified chronic obstructive pulmonary disease: Secondary | ICD-10-CM

## 2014-02-16 DIAGNOSIS — F0390 Unspecified dementia without behavioral disturbance: Secondary | ICD-10-CM

## 2014-02-16 DIAGNOSIS — M25569 Pain in unspecified knee: Secondary | ICD-10-CM

## 2014-02-16 DIAGNOSIS — I4891 Unspecified atrial fibrillation: Secondary | ICD-10-CM

## 2014-02-16 DIAGNOSIS — R627 Adult failure to thrive: Secondary | ICD-10-CM

## 2014-02-16 DIAGNOSIS — F0280 Dementia in other diseases classified elsewhere without behavioral disturbance: Secondary | ICD-10-CM

## 2014-02-16 DIAGNOSIS — F3289 Other specified depressive episodes: Secondary | ICD-10-CM

## 2014-02-16 DIAGNOSIS — E039 Hypothyroidism, unspecified: Secondary | ICD-10-CM

## 2014-02-16 DIAGNOSIS — F0393 Unspecified dementia, unspecified severity, with mood disturbance: Secondary | ICD-10-CM

## 2014-02-16 DIAGNOSIS — J449 Chronic obstructive pulmonary disease, unspecified: Secondary | ICD-10-CM

## 2014-02-16 DIAGNOSIS — K219 Gastro-esophageal reflux disease without esophagitis: Secondary | ICD-10-CM

## 2014-02-16 DIAGNOSIS — R609 Edema, unspecified: Secondary | ICD-10-CM

## 2014-02-16 DIAGNOSIS — D649 Anemia, unspecified: Secondary | ICD-10-CM

## 2014-02-16 DIAGNOSIS — M25562 Pain in left knee: Secondary | ICD-10-CM

## 2014-02-16 DIAGNOSIS — I2510 Atherosclerotic heart disease of native coronary artery without angina pectoris: Secondary | ICD-10-CM

## 2014-02-16 DIAGNOSIS — E871 Hypo-osmolality and hyponatremia: Secondary | ICD-10-CM

## 2014-02-16 DIAGNOSIS — F329 Major depressive disorder, single episode, unspecified: Secondary | ICD-10-CM

## 2014-02-16 NOTE — Assessment & Plan Note (Signed)
No c/o chest pain.

## 2014-02-16 NOTE — Assessment & Plan Note (Signed)
C/o nausea and sick stomach in am prior to breakfast-better with  Famotidine to 20mg  bid

## 2014-02-16 NOTE — Assessment & Plan Note (Signed)
rogression of dementia, depression, weight loss #6Ibs in one month, ADL dependency-requires assistance with feeding at times. Hospice Service-comfort measures. Staff reported the patient's poor oral intake in the past a few day.

## 2014-02-16 NOTE — Assessment & Plan Note (Signed)
Hx of the problem. Serum Na 130 02/08/14.

## 2014-02-16 NOTE — Progress Notes (Signed)
Patient ID: [name], male   DOB: 1924/06/13, 80 y.o.   MRN: [id number]   Code Status: DNR  Allergies  Allergen Reactions  . Sulfonamide Derivatives Other (See Comments)    unknown  . Penicillins Rash    Chief Complaint  Patient presents with  . Medical Managment of Chronic Issues  . Dementia    FTT    HPI: Patient is a 80 y.o. male seen in the SNF at [facility] today for evaluation of FTT and other chronic medical conditions.  Problem List Items Addressed This Visit   A-fib - Primary     Rate controlled, takes Cardizem 180mg  for A-fib     Anemia     09/30/13. Iron 58, B12 348, Folate 9.8, Erythropoietin 20.1-chronic diease in nature. Baseline Hgb 8s-9.5 01/27/14. Off Iron.      CAD (coronary artery disease)     No c/o chest pain.     COPD     Chronic cough and phlegm production. Dc Advair-ineffective, continue DuoNeb prn, dc Mucinex 1200mg  daily, Robitussin standing order available to him. Will have Morphine prn available to him for SOB and pain.        Relevant Medications      ipratropium-albuterol (DUONEB) 0.5-2.5 (3) MG/3ML SOLN   Dementia     Progressing gradually, near total care now, will dc Namenda.     Depression due to dementia     lat affect and lack of motives-had panic attack 3/31/5, hollering 02/16/14, takes Cymbalta 20mg  daily, will add Lorazepam 0.5mg  q6hr prn.       Edema     Trace in his left ankle,  stopped taking Lasix upon discharge    FTT (failure to thrive) in adult     rogression of dementia, depression, weight loss #6Ibs in one month, ADL dependency-requires assistance with feeding at times. Hospice Service-comfort measures. Staff reported the patient's poor oral intake in the past a few day.     GERD     C/o nausea and sick stomach in am prior to breakfast-better with  Famotidine to 20mg  bid      HTN (hypertension)     Controlled, takes Cardizem 180mg  for A-fib     Hyponatremia (Chronic)     Hx of the problem.  Serum Na 130 02/08/14.     Left knee pain     06/22/13 X-ray left knee: moderate to advanced OA most prominent at the medial compartment and patellofemoral joint space, small degenerative spur at the superior patella., no acute fracture, subluxation, dislocation, or lytic destructive lesion, small knee effusion. takes Tylenol 650mg  bid and will add Morphine 5mg  q6h prn.       Unspecified hypothyroidism     Takes Levothyroxine 65mcg daily,TSH 3.715 04/27/13, 2.379 09/30/13, 3.078 01/27/14        Review of Systems:  Review of Systems  Constitutional: Positive for weight loss. Negative for fever, chills and diaphoresis.       #6 Ibs in one month.  HENT: Positive for hearing loss. Negative for congestion, ear discharge, ear pain and sore throat.   Eyes: Negative for blurred vision, double vision, photophobia, pain, discharge and redness.  Respiratory: Positive for cough. Negative for hemoptysis, sputum production, shortness of breath and wheezing.   Cardiovascular: Positive for leg swelling. Negative for chest pain, palpitations, orthopnea, claudication and PND.       Very trace edema in ankles.   Gastrointestinal: Positive for nausea. Negative for heartburn, vomiting, abdominal pain, diarrhea,  constipation and blood in stool.       In am ac breakfast sometimes. Difficulty taking pills  Genitourinary: Positive for frequency. Negative for dysuria, urgency, hematuria and flank pain.  Musculoskeletal: Positive for joint pain (left knee pain ). Negative for back pain, falls, myalgias and neck pain.       Left knee pain.   Skin: Negative for itching and rash.  Neurological: Positive for weakness. Negative for dizziness, tingling, tremors, sensory change, speech change, focal weakness, seizures, loss of consciousness and headaches.  Endo/Heme/Allergies: Negative for environmental allergies and polydipsia. Does not bruise/bleed easily.  Psychiatric/Behavioral: Positive for depression and memory loss.  Negative for hallucinations and substance abuse. The patient is nervous/anxious. The patient does not have insomnia.        Flat affect and lack of motives-less verbal over the last 2 days and not eating/drinking as well. Staff reported the patient had panic attack 02/15/14. 02/16/14 the patient was hollering out with increased RR 22. Taook some time to calm him down.    Past Medical History  Diagnosis Date  . Allergic rhinitis   . 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 COPD (chronic obstructive pulmonary disease)   . Diverticula of colon   . Right leg DVT 01/2010  . IHD (ischemic heart disease)   . Hyperlipidemia   . Hypertension   . Depression   . Myalgia   . Hypothyroidism   . Bronchiectasis   . Spinal stenosis   . History of heartburn   . Joint pain   . 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 Coronary artery disease   . Pneumonia   . Arthritis   . CHF (congestive heart failure)   . Impacted cerumen 12/21/2012  . Arthralgia of temporomandibular joint 12/21/2012  . Contusion of hand(s) 10/07/2012  . Personal history of fall 10/07/2012  . Other manifestations of vitamin A deficiency 08/20/2012  . Anemia, unspecified 10/.01/2012  . Essential and other specified forms of tremor 08/20/2012  . Other emphysema 08/20/2012  . Muscle weakness (generalized) 10/.01/2012  . Kyphosis (acquired) (postural) 08/20/2012  . Abnormality of gait 08/20/2012  . Edema 08/20/2012  . Rash and other nonspecific skin eruption 10.01/2012  . Nonspecific abnormal results of pulmonary system function study 08/20/2012  . Dysphagia, unspecified(787.20) 08/20/2012  . Nonspecific abnormal results of pulmonary system function study 08/20/2012  . Candidiasis of skin and nails 08/17/2012  . Memory loss 07/13/2012  . Hyposmolality and/or hyponatremia 07/08/2012  . Hypopotassemia 07/08/2012  . Acute posthemorrhagic anemia 07/08/2012  . Major depressive disorder, single episode,  unspecified 07/08/2012  . Atrial fibrillation 07/08/2012  . Hypotension, unspecified 07/08/2012  . Allergic rhinitis due to pollen 07/08/2012  . Bronchiectasis without acute exacerbation 07/08/2012  . Chronic airway obstruction, not elsewhere classified 07/08/2012  . Reflux esophagitis 07/08/2012  . Spinal stenosis, unspecified region other than cervical 07/08/2012  . Edema 07/08/2012  . Chest pain, unspecified 07/08/2012  . Transient ischemic attack (TIA), and cerebral infarction without residual deficits(V12.54) 07/08/2012   Past Surgical History  Procedure Laterality Date  . Cardiac catheterization  06/13/2000    normal. ef 60%  . Cardiovascular stress test  04/2010    ef 54%, and normal perfusion  . Coronary angioplasty  1991    1st diagonal  . Carpal tunnel release      right  . Rotator cuff repair      left shoulder  . Knee surgery  August  2006    S/P right knee replacement  . Laminectomy      2005  . Cataract extraction      Bilateral    Social History:   reports that he quit smoking about 61 years ago. His smoking use included Cigarettes. He has a 25 pack-year smoking history. He has never used smokeless tobacco. He reports that he does not drink alcohol or use illicit drugs.  Family History  Problem Relation Age of Onset  . Heart disease Father   . Heart failure Father   . Breast cancer Maternal Aunt     Medications: Patient's Medications  New Prescriptions   No medications on file  Previous Medications   ACETAMINOPHEN (TYLENOL) 500 MG TABLET    Take 500 mg by mouth 2 (two) times daily. pain   DILTIAZEM (DILACOR XR) 180 MG 24 HR CAPSULE    Take 180 mg by mouth every morning.   DULOXETINE (CYMBALTA) 20 MG CAPSULE    Take 20 mg by mouth daily.   FAMOTIDINE (PEPCID) 20 MG TABLET    Take 20 mg by mouth 2 (two) times daily. Take 1 tablet once daily.   FEEDING SUPPLEMENT (ENSURE) PUDG    Take 1 Container by mouth 3 (three) times daily between meals.   FLUTICASONE  (FLONASE) 50 MCG/ACT NASAL SPRAY    Place 2 sprays into the nose 2 (two) times daily. Two sprays in each nostril, 2 times daily.   IPRATROPIUM-ALBUTEROL (DUONEB) 0.5-2.5 (3) MG/3ML SOLN    Take 3 mLs by nebulization every 6 (six) hours as needed.   LEVOTHYROXINE (SYNTHROID, LEVOTHROID) 50 MCG TABLET    Take 50 mcg by mouth every morning. Take 1 tablet once daily for thyroid.   NITROGLYCERIN (NITROSTAT) 0.4 MG SL TABLET    Place 0.4 mg under the tongue every 5 (five) minutes as needed. Take 1 tablet sublingual at onset of chest pain, repeat in 5 minutes, May repeat x 3 in 15 minutes.  Modified Medications   No medications on file  Discontinued Medications   ALBUTEROL (PROVENTIL) (5 MG/ML) 0.5% NEBULIZER SOLUTION    Take 0.5 mLs (2.5 mg total) by nebulization 4 (four) times daily.   ASPIRIN EC 81 MG TABLET    Take 81 mg by mouth every morning. Take 1 tablet daily to prevent heart attack and stroke.   FLUTICASONE-SALMETEROL (ADVAIR) 250-50 MCG/DOSE AEPB    Inhale 1 puff into the lungs every 12 (twelve) hours. Take 1 puff twice daily for COPD.   GUAIFENESIN (MUCINEX) 600 MG 12 HR TABLET    Take 1,200 mg by mouth daily.    MELOXICAM (MOBIC) 7.5 MG TABLET    Take 7.5 mg by mouth daily.   MEMANTINE HCL ER (NAMENDA XR) 28 MG CP24    Take 1 capsule by mouth every morning.   MOMETASONE-FORMOTEROL (DULERA) 100-5 MCG/ACT AERO    Inhale 2 puffs into the lungs 2 (two) times daily.   MULTIPLE VITAMINS-MINERALS (PRESERVISION AREDS) CAPS    Take 1 capsule by mouth daily with lunch. Take 1 tablet once daily in the evening.   Physical Exam: Physical Exam  Constitutional: He is oriented to person, place, and time. He appears well-developed and well-nourished. No distress.  HENT:  Head: Normocephalic and atraumatic.  Right Ear: External ear normal.  Nose: Nose normal.  Mouth/Throat: Oropharynx is clear and moist. No oropharyngeal exudate.  Eyes: Conjunctivae and EOM are normal. Pupils are equal, round, and  reactive to light. Right eye exhibits no  discharge. Left eye exhibits no discharge. No scleral icterus.  Neck: Normal range of motion. Neck supple. No JVD present. No tracheal deviation present. No thyromegaly present.  Cardiovascular: Normal rate.  An irregular rhythm present. Exam reveals no gallop and no friction rub.   No murmur heard. Pulmonary/Chest: Effort normal. He has decreased breath sounds. He has no wheezes. He has no rales. He exhibits no tenderness.  Abdominal: Soft. Bowel sounds are normal. He exhibits no distension. There is no tenderness. There is no rebound.  Musculoskeletal: Normal range of motion. He exhibits edema (trace in the left ankle. ) and tenderness (left knee. ).  Chronic left knee pain. Trace edema in ankles.   Lymphadenopathy:    He has no cervical adenopathy.  Neurological: He is alert and oriented to person, place, and time. He has normal reflexes. No cranial nerve deficit. He exhibits normal muscle tone. Coordination normal.  Skin: Skin is warm and dry. No rash noted. He is not diaphoretic. No erythema. No pallor.  Psychiatric: Judgment and thought content normal. His mood appears anxious. His speech is not delayed. He is slowed and withdrawn. Cognition and memory are impaired. He exhibits a depressed mood. He exhibits abnormal recent memory and abnormal remote memory.    Filed Vitals:   02/16/14 1629  BP: 107/60  Pulse: 78  Temp: 97.5 F (36.4 C)  TempSrc: Tympanic  Resp: 14   Labs reviewed: Basic Metabolic Panel:  Recent Labs  04/09/13 1652  04/11/13 0449 04/13/13 0422 04/14/13 0600  04/27/13  09/30/13  12/14/13 01/27/14 02/08/14  NA  --   < > 132* 125* 125*  < >  --   < > 135*  < > 136* 132* 130*  K  --   < > 3.8 4.2 4.3  < >  --   < > 4.3  < > 3.9 3.9 3.5  CL  --   < > 101 94* 94*  --   --   --   --   --   --   --   --   CO2  --   < > 23 23 23   --   --   --   --   --   --   --   --   GLUCOSE  --   < > 94 98 92  --   --   --   --   --   --    --   --   BUN  --   < > 12 11 10   < >  --   < > 21  < > 18 14 13   CREATININE  --   < > 1.13 1.08 1.00  < >  --   < > 1.0  < > 1.0 0.9 0.8  CALCIUM  --   < > 8.4 8.4 8.4  --   --   --   --   --   --   --   --   MG 2.0  --   --   --   --   --   --   --   --   --   --   --   --   PHOS 3.0  --   --   --   --   --   --   --   --   --   --   --   --   TSH  --   < >  --   --   --   --  3.72  --  2.38  --   --  3.08  --   < > = values in this interval not displayed. Liver Function Tests:  Recent Labs  04/02/13 2235 04/10/13 0440 04/13/13 0422 09/30/13 01/27/14  AST 14 12 13  12* 13*  ALT 8 6 6  8* 8*  ALKPHOS 57 49 53 46 554*  BILITOT 0.4 0.3 0.2*  --   --   PROT 7.5 6.2 6.6  --   --   ALBUMIN 3.1* 2.2* 2.3*  --   --    CBC:  Recent Labs  04/02/13 2235 04/09/13 1652  04/11/13 0449 04/13/13 0422 04/14/13 0600  11/23/13 12/14/13 01/27/14  WBC 9.5 7.4  < > 5.9 5.8 6.3  < > 7.0 6.6 4.6  NEUTROABS 6.3 5.1  --   --   --   --   --   --   --   --   HGB 10.0* 9.8*  < > 8.4* 8.4* 8.6*  < > 9.3* 8.7* 9.5*  HCT 29.7* 28.0*  < > 25.0* 23.9* 24.0*  < > 27* 26* 22*  MCV 98.0 97.6  < > 100.4* 98.0 95.6  --   --   --   --   PLT 202 264  < > 269 328 381  < > 277 281 310  < > = values in this interval not displayed.  Past Procedures:  08/03/13 CXR no cardiomegaly or pulmonary vascular congestion, old granulomatous disease, patchy pneumonitis/inflammatory consolidate at the right upper lung and left mid/lower lung improved in the interval   Assessment/Plan A-fib Rate controlled, takes Cardizem 180mg  for A-fib   Anemia 09/30/13. Iron 58, B12 348, Folate 9.8, Erythropoietin 20.1-chronic diease in nature. Baseline Hgb 8s-9.5 01/27/14. Off Iron.    CAD (coronary artery disease) No c/o chest pain.   COPD Chronic cough and phlegm production. Dc Advair-ineffective, continue DuoNeb prn, dc Mucinex 1200mg  daily, Robitussin standing order available to him. Will have Morphine prn available to him for  SOB and pain.      Dementia Progressing gradually, near total care now, will dc Namenda.   Depression due to dementia lat affect and lack of motives-had panic attack 3/31/5, hollering 02/16/14, takes Cymbalta 20mg  daily, will add Lorazepam 0.5mg  q6hr prn.     Edema Trace in his left ankle,  stopped taking Lasix upon discharge  FTT (failure to thrive) in adult rogression of dementia, depression, weight loss #6Ibs in one month, ADL dependency-requires assistance with feeding at times. Hospice Service-comfort measures. Staff reported the patient's poor oral intake in the past a few day.   GERD C/o nausea and sick stomach in am prior to breakfast-better with  Famotidine to 20mg  bid    HTN (hypertension) Controlled, takes Cardizem 180mg  for A-fib   Hyponatremia Hx of the problem. Serum Na 130 02/08/14.   Left knee pain 06/22/13 X-ray left knee: moderate to advanced OA most prominent at the medial compartment and patellofemoral joint space, small degenerative spur at the superior patella., no acute fracture, subluxation, dislocation, or lytic destructive lesion, small knee effusion. takes Tylenol 650mg  bid and will add Morphine 5mg  q6h prn.     Unspecified hypothyroidism Takes Levothyroxine 73mcg daily,TSH 3.715 04/27/13, 2.379 09/30/13, 3.078 01/27/14     Family/ Staff Communication: observe the patient  Goals of Care: SNF and Hospice Service.   Labs/tests ordered: none

## 2014-02-16 NOTE — Assessment & Plan Note (Signed)
Trace in his left ankle,  stopped taking Lasix upon discharge

## 2014-02-16 NOTE — Assessment & Plan Note (Signed)
Progressing gradually, near total care now, will dc Namenda.

## 2014-02-16 NOTE — Assessment & Plan Note (Signed)
Takes Levothyroxine 61mcg daily,TSH 3.715 04/27/13, 2.379 09/30/13, 3.078 01/27/14

## 2014-02-16 NOTE — Assessment & Plan Note (Signed)
09/30/13. Iron 58, B12 348, Folate 9.8, Erythropoietin 20.1-chronic diease in nature. Baseline Hgb 8s-9.5 01/27/14. Off Iron.

## 2014-02-16 NOTE — Assessment & Plan Note (Signed)
06/22/13 X-ray left knee: moderate to advanced OA most prominent at the medial compartment and patellofemoral joint space, small degenerative spur at the superior patella., no acute fracture, subluxation, dislocation, or lytic destructive lesion, small knee effusion. takes Tylenol 650mg  bid and will add Morphine 5mg  q6h prn.

## 2014-02-16 NOTE — Assessment & Plan Note (Signed)
lat affect and lack of motives-had panic attack 3/31/5, hollering 02/16/14, takes Cymbalta 20mg  daily, will add Lorazepam 0.5mg  q6hr prn.

## 2014-02-16 NOTE — Assessment & Plan Note (Signed)
Rate controlled, takes Cardizem 180mg  for A-fib

## 2014-02-16 NOTE — Assessment & Plan Note (Signed)
Controlled, takes Cardizem 180mg  for A-fib

## 2014-02-16 NOTE — Assessment & Plan Note (Signed)
Chronic cough and phlegm production. Dc Advair-ineffective, continue DuoNeb prn, dc Mucinex 1200mg  daily, Robitussin standing order available to him. Will have Morphine prn available to him for SOB and pain.

## 2014-03-18 IMAGING — CT CT CHEST W/ CM
1 series · 14 of 31 positions shown, 18 images · IV contrast (OMNIPAQUE)
Comparison: Two-view chest x-ray 04/09/2013, 04/02/2013,
08/10/2012.  CT chest 10/05/2007.

CLINICAL DATA: Recent abnormal chest imaging with right upper lobe
cavitary opacity.

CT CHEST WITH CONTRAST
TECHNIQUE: Multidetector CT imaging of the chest was performed
following the standard protocol during bolus administration of
intravenous contrast.
Contrast: 100mL OMNIPAQUE IOHEXOL 300 MG/ML IV.

[Series 2: chest with st · axial · 0.77mm/px · z∈[-321,-91]mm · 14 of 56 slices shown, 18 images]
[im 5/56  mediastinal]
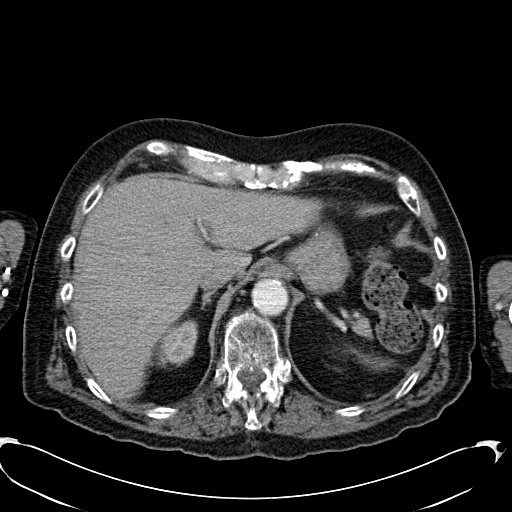
[im 5/56  lung]
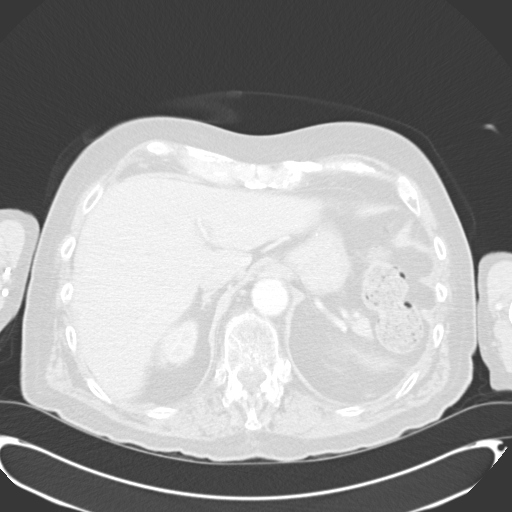
[im 9/56  lung]
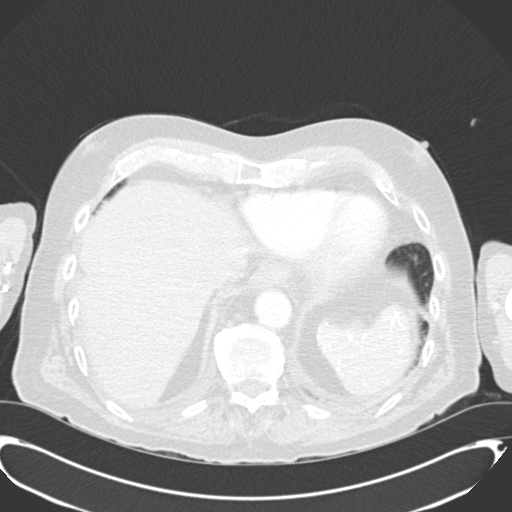
[im 12/56  lung]
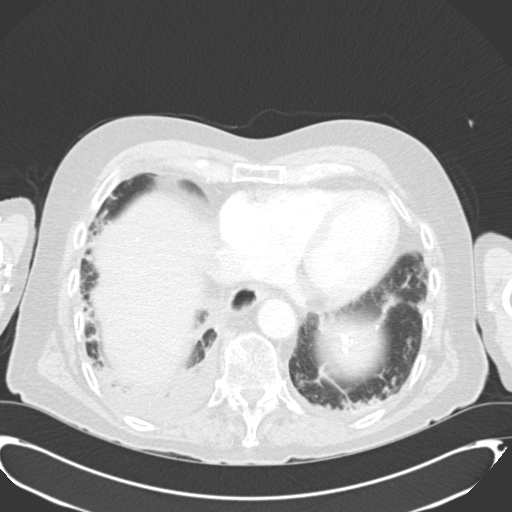
[im 15/56  lung]
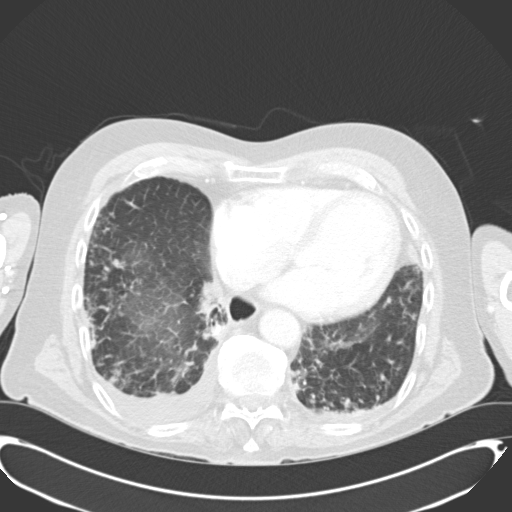
[im 19/56  mediastinal]
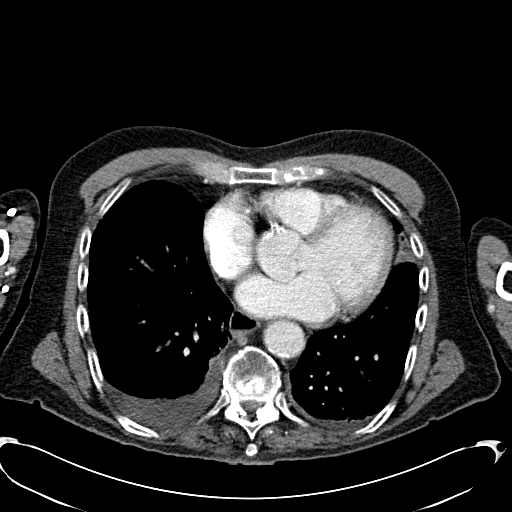
[im 19/56  lung]
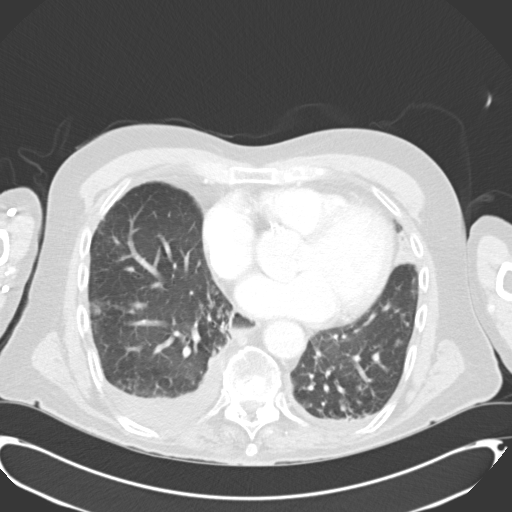
[im 23/56  lung]
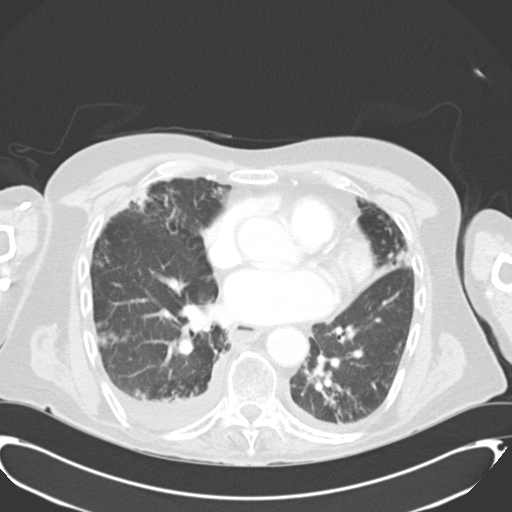
[im 27/56  lung]
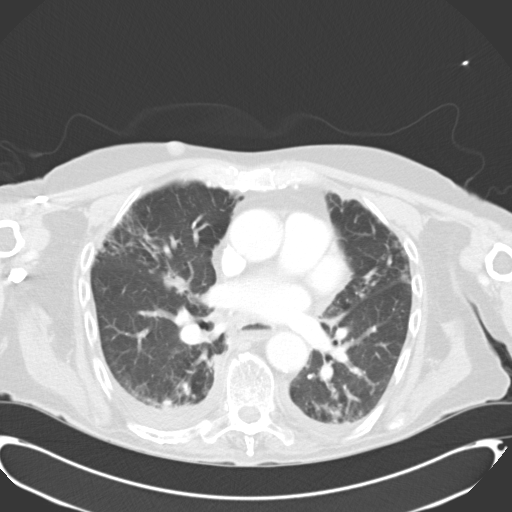
[im 30/56  lung]
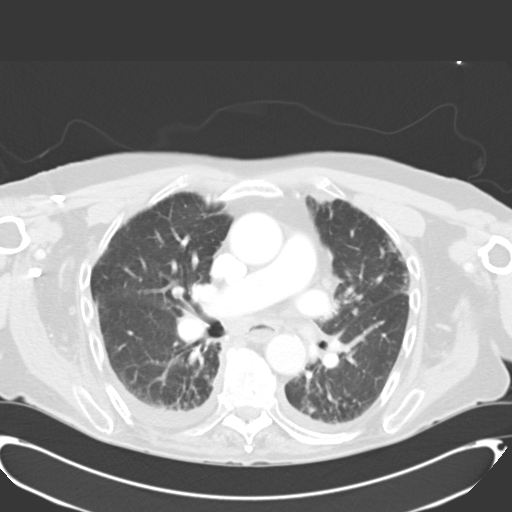
[im 33/56  mediastinal]
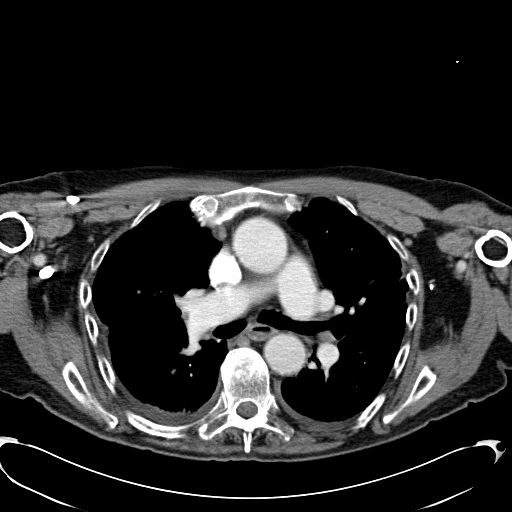
[im 33/56  lung]
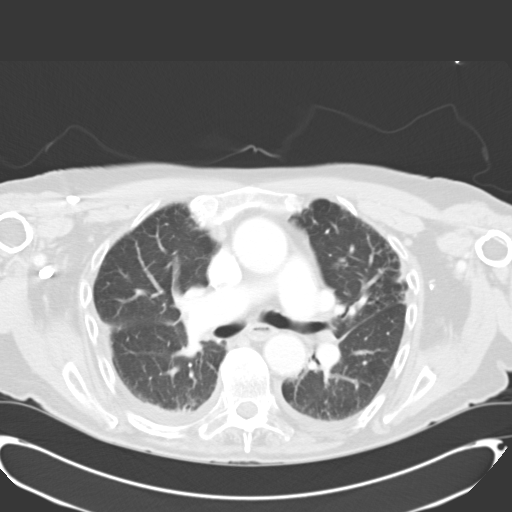
[im 35/56  lung]
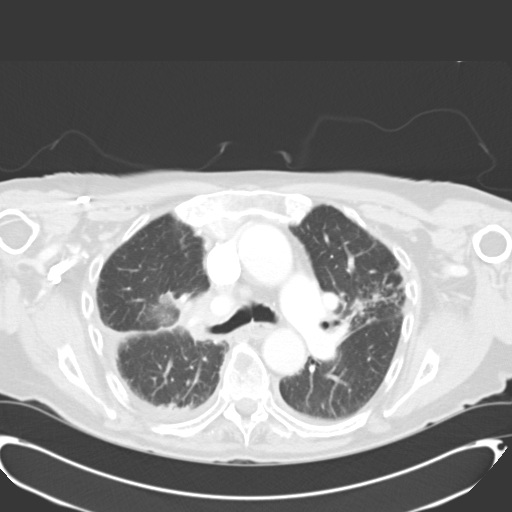
[im 39/56  lung]
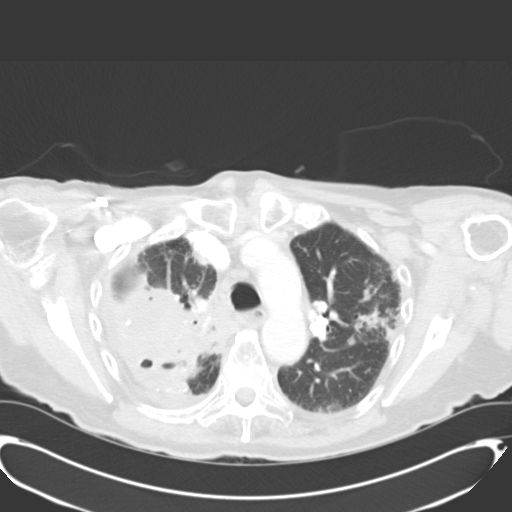
[im 43/56  lung]
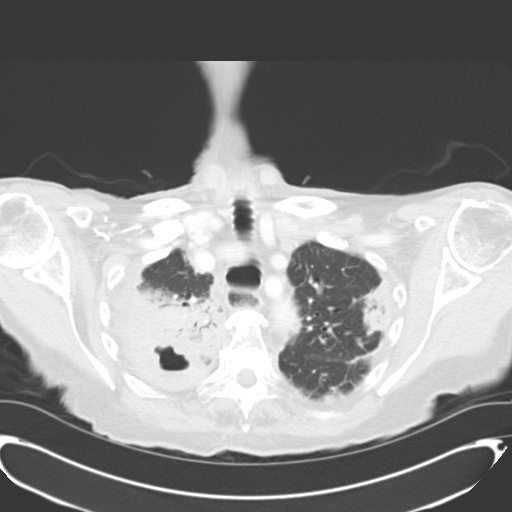
[im 47/56  mediastinal]
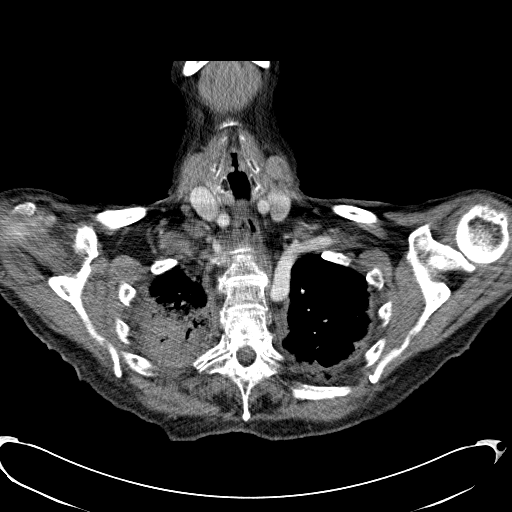
[im 47/56  lung]
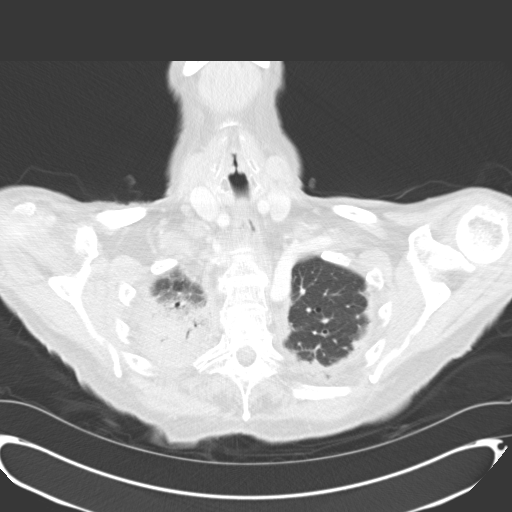
[im 51/56  lung]
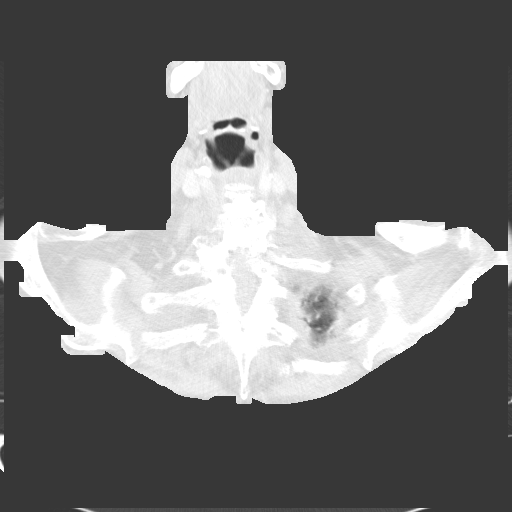

[14 of 31 positions shown; findings below may reference images not displayed]

FINDINGS: Airspace consolidation with air bronchograms in the right
apex.  Necrosis with cavitation in the right upper lobe parenchyma,
with air-fluid levels.  Parenchymal calcifications in the area of
consolidation and necrosis, consistent with calcified granulomata.
Small amount of pleural fluid immediately adjacent to the
parenchymal necrosis, without evidence of pleural enhancement to
suggest empyema.

Patchy airspace opacities in the left upper lobe, inferior right
upper lobe, and both lower lobes.  Calcified granuloma laterally in
the right lower lobe.  Chronic pleural thickening bilaterally.
Small right pleural effusion dependently.  No definite left pleural
effusion.

Heart enlarged with left ventricular predominance.  Severe LAD and
left circumflex coronary atherosclerosis.  No pericardial effusion.
Moderate atherosclerosis involving the thoracic and upper abdominal
aorta without aneurysm or dissection.

Scattered upper normal size to slightly enlarged mediastinal and
right hilar lymph nodes, the largest in the right hilum measuring
approximately 2.4 x 1.6 cm and in the subcarinal region of the
mediastinum (station 7) measuring 1.3 x 1.9 cm.  No nodal masses.
Visualized thyroid gland atrophic.

Air-fluid level in the esophagus without evidence of hiatal hernia.
Numerous calcified granulomata in the spleen.  Calcified granuloma
in the anterior segment right lobe of visualized liver.  Visualized
upper abdomen otherwise unremarkable.  Probable sebaceous cyst in
the subcutaneous fat of the right chest wall, not visualized on the
prior CT.  Bone window images demonstrate exaggeration of the usual
thoracic kyphosis, generalized osseous demineralization, and
degenerative changes involving the lower cervical spine and
throughout the thoracic spine; spinal stenosis is suspected at the
C6-7 level due to a chronic disc herniation with calcification in
the posterior annular fibers.
IMPRESSION: 1.  Pneumonia involving the right upper lobe with associated lung
abscess, accounting for the cavitary lesion on recent chest
imaging.  Patchy pneumonia is also present elsewhere throughout
both lungs.
2.  Reactive right hilar and subcarinal mediastinal
lymphadenopathy.
3.  Mild cardiomegaly without evidence of pulmonary edema.
4.  Small right pleural effusion.  There is also a small amount of
pleural fluid in the right upper thorax adjacent to the lung
abscess, but there is no pleural enhancement to confirm empyema.
5.  Old granulomatous disease.
6.  Air-fluid level in the esophagus without evidence of hiatal
hernia, GE reflux and/or esophageal dysmotility.
7.  Probable sebaceous cyst in the right chest wall.
8.  Spinal stenosis suspected at the C6-7 level due to a chronic
disc herniation with calcification in the posterior annular fibers.

## 2014-03-18 DEATH — deceased
